# Patient Record
Sex: Female | Born: 1990 | Race: White | Hispanic: No | Smoking: Former smoker
Health system: Southern US, Community
[De-identification: ages and names within clinical notes are randomized; demographics above are authoritative.]

## PROBLEM LIST (undated history)

## (undated) DIAGNOSIS — R519 Headache, unspecified: Secondary | ICD-10-CM

## (undated) DIAGNOSIS — F419 Anxiety disorder, unspecified: Secondary | ICD-10-CM

## (undated) DIAGNOSIS — F32A Depression, unspecified: Secondary | ICD-10-CM

## (undated) DIAGNOSIS — F329 Major depressive disorder, single episode, unspecified: Secondary | ICD-10-CM

## (undated) DIAGNOSIS — G459 Transient cerebral ischemic attack, unspecified: Secondary | ICD-10-CM

## (undated) DIAGNOSIS — F209 Schizophrenia, unspecified: Secondary | ICD-10-CM

## (undated) DIAGNOSIS — G43909 Migraine, unspecified, not intractable, without status migrainosus: Secondary | ICD-10-CM

## (undated) DIAGNOSIS — F314 Bipolar disorder, current episode depressed, severe, without psychotic features: Secondary | ICD-10-CM

## (undated) DIAGNOSIS — K5900 Constipation, unspecified: Secondary | ICD-10-CM

## (undated) DIAGNOSIS — R51 Headache: Secondary | ICD-10-CM

## (undated) DIAGNOSIS — I639 Cerebral infarction, unspecified: Secondary | ICD-10-CM

## (undated) HISTORY — DX: Anxiety disorder, unspecified: F41.9

## (undated) HISTORY — DX: Bipolar disorder, current episode depressed, severe, without psychotic features: F31.4

## (undated) HISTORY — DX: Transient cerebral ischemic attack, unspecified: G45.9

## (undated) HISTORY — DX: Cerebral infarction, unspecified: I63.9

## (undated) HISTORY — DX: Constipation, unspecified: K59.00

## (undated) HISTORY — DX: Migraine, unspecified, not intractable, without status migrainosus: G43.909

## (undated) HISTORY — PX: WISDOM TOOTH EXTRACTION: SHX21

---
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
## 2008-09-28 ENCOUNTER — Emergency Department: Payer: Self-pay | Admitting: Emergency Medicine

## 2009-11-22 ENCOUNTER — Inpatient Hospital Stay: Payer: Self-pay | Admitting: Psychiatry

## 2011-05-02 ENCOUNTER — Ambulatory Visit: Payer: Self-pay | Admitting: Family Medicine

## 2011-05-09 ENCOUNTER — Ambulatory Visit: Payer: Self-pay | Admitting: Family Medicine

## 2012-04-23 ENCOUNTER — Ambulatory Visit: Payer: Self-pay | Admitting: Family Medicine

## 2012-08-30 ENCOUNTER — Emergency Department: Payer: Self-pay | Admitting: Emergency Medicine

## 2012-08-30 LAB — URINALYSIS, COMPLETE
Bacteria: NONE SEEN
Glucose,UR: NEGATIVE mg/dL (ref 0–75)
Ketone: NEGATIVE
Leukocyte Esterase: NEGATIVE
Ph: 6 (ref 4.5–8.0)
Protein: NEGATIVE
RBC,UR: 4 /HPF (ref 0–5)
Specific Gravity: 1.025 (ref 1.003–1.030)
WBC UR: 14 /HPF (ref 0–5)

## 2012-08-30 LAB — COMPREHENSIVE METABOLIC PANEL
Albumin: 3.1 g/dL — ABNORMAL LOW (ref 3.4–5.0)
Alkaline Phosphatase: 88 U/L (ref 50–136)
Bilirubin,Total: 0.6 mg/dL (ref 0.2–1.0)
Calcium, Total: 8.9 mg/dL (ref 8.5–10.1)
Chloride: 107 mmol/L (ref 98–107)
Creatinine: 0.72 mg/dL (ref 0.60–1.30)
EGFR (African American): >60
Glucose: 100 mg/dL — ABNORMAL HIGH (ref 65–99)
Osmolality: 280 (ref 275–301)
SGPT (ALT): 39 U/L (ref 12–78)
Sodium: 141 mmol/L (ref 136–145)

## 2012-08-30 LAB — CBC
HCT: 43.8 % (ref 35.0–47.0)
MCV: 90 fL (ref 80–100)
Platelet: 198 10*3/uL (ref 150–440)
RBC: 4.88 10*6/uL (ref 3.80–5.20)
RDW: 12.4 % (ref 11.5–14.5)
WBC: 7.7 10*3/uL (ref 3.6–11.0)

## 2012-08-30 LAB — PREGNANCY, URINE: Pregnancy Test, Urine: NEGATIVE m[IU]/mL

## 2012-10-13 ENCOUNTER — Emergency Department: Payer: Self-pay | Admitting: Emergency Medicine

## 2012-12-08 ENCOUNTER — Encounter: Payer: Self-pay | Admitting: Obstetrics and Gynecology
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
## 2013-07-03 ENCOUNTER — Ambulatory Visit: Payer: Self-pay | Admitting: Obstetrics and Gynecology

## 2013-07-03 LAB — CBC WITH DIFFERENTIAL/PLATELET
Basophil #: 0 10*3/uL (ref 0.0–0.1)
Basophil %: 0.2 %
HCT: 37.6 % (ref 35.0–47.0)
HGB: 13.3 g/dL (ref 12.0–16.0)
Lymphocyte %: 14.1 %
MCH: 31.2 pg (ref 26.0–34.0)
MCHC: 35.4 g/dL (ref 32.0–36.0)
MCV: 88 fL (ref 80–100)
Monocyte #: 0.8 x10 3/mm (ref 0.2–0.9)
Monocyte %: 6 %
Neutrophil #: 10.4 10*3/uL — ABNORMAL HIGH (ref 1.4–6.5)
RBC: 4.27 10*6/uL (ref 3.80–5.20)

## 2013-07-06 ENCOUNTER — Inpatient Hospital Stay: Payer: Self-pay | Admitting: Obstetrics and Gynecology

## 2013-07-06 LAB — GC/CHLAMYDIA PROBE AMP

## 2013-07-07 LAB — HEMATOCRIT: HCT: 35.1 % (ref 35.0–47.0)

## 2014-04-12 ENCOUNTER — Ambulatory Visit: Payer: Self-pay | Admitting: Family Medicine

## 2015-03-18 NOTE — Op Note (Signed)
PATIENT NAME:  Terri Holt, Terri Holt MR#:  [id number] DATE OF BIRTH:  12-19-1990  DATE OF PROCEDURE:  07/06/2013  PREOPERATIVE DIAGNOSES: 1.  Term intrauterine pregnancy, undelivered.  2.  History of herpes simplex virus-2.  3.  Desires primary cesarean section delivery.   POSTOPERATIVE DIAGNOSES:  1.  Term intrauterine pregnancy, undelivered.  2.  History of herpes simplex virus-2.  3.  Desires primary cesarean section delivery.   OPERATIVE PROCEDURE: Primary low cervical transverse cesarean section.   SURGEON: Herold HarmsMartin A Yetzali Weld, M.D.   FIRST ASSISTANT: Earlie LouLindsay Overton, FNP.   ANESTHESIA: Spinal.   INDICATIONS: The patient is a [age] year old white female G2, para 0-0-1-0, at 5139 weeks gestation who presents for primary low cervical transverse cesarean section. The patient has history of HSV-2 and desires primary cesarean section delivery. She does not have any active outbreak or prodromal symptoms. The patient accepts risks of surgical procedure.   FINDINGS AT SURGERY: Viable female infant, 7 pounds, 15, ounces having Apgars of 9 and 9 at one and five minutes, respectively. There was a nuchal cord x 1 noted. The uterus, tubes, and ovaries were grossly normal.   DESCRIPTION OF PROCEDURE: The patient was brought to the operating room where she was placed in the sitting position. Spinal anesthetic was introduced without difficulty. She was placed in the supine position with a right lateral hip roll in place. A ChloraPrep abdominal and perineal prep and drape was performed in standard fashion. A Foley catheter was placed and was draining clear yellow urine. After checking for adequate level of anesthesia, a Pfannenstiel incision was made in the abdomen. The fascia was incised transversely and extended bilaterally with Mayo scissors. The midline raphe was incised, separated, and peritoneum entered. Bladder flap was created over lower uterine segment through sharp dissection. A low transverse incision  was made in the uterus and this was extended bluntly cephalad and caudad. The amniotic fluid was noted to be clear. The infant was delivered through a vertex presentation. Nuchal cord x 1 was reduced. The infant was vigorous at birth. The umbilical cord was doubly clamped and cut and the infant was handed off to the awaiting resuscitation team. Cord blood sampling was obtained. The uterus was expressed from the operative field and the uterus was externalized onto the abdomen. The uterus was cleared of all debris with laps. The incision was closed in two layers using #1 chromic suture. First layer was a running locking stitch, second layer was an imbricating layer. Good hemostasis was noted. The tubes and ovaries were normal. Uterus was placed back into the abdominal pelvic cavity. Gutters were cleared of all debris with laps. The incision was closed in layers with 0 Maxon being used in the fascia in a simple running manner. A 2-0 Vicryl was used to reapproximate the subcutaneous tissues. A 4-0 Vicryl subcuticular stitch was used to close the skin. Steri-Strips were applied along with Telfa pad and Tegaderm dressing. The patient was then mobilized, and taken to the recovery room in satisfactory condition.   ESTIMATED BLOOD LOSS: 500 mL.   Urine output was not quantified at the time of this dictation. All instruments, needle and sponge counts were verified as correct.    ____________________________ Prentice DockerMartin A. Laysa Kimmey, MD mad:cc D: 07/06/2013 21:43:41 ET T: 07/06/2013 21:53:31 ET JOB#: [id number]  cc: Daphine DeutscherMartin A. Neithan Day, MD, <Dictator> [facility] Prentice DockerMARTIN A Jaysin Gayler MD ELECTRONICALLY SIGNED 07/13/2013 8:31

## 2015-08-03 ENCOUNTER — Encounter: Payer: Self-pay | Admitting: Family Medicine

## 2015-08-03 ENCOUNTER — Ambulatory Visit (INDEPENDENT_AMBULATORY_CARE_PROVIDER_SITE_OTHER): Payer: 59 | Admitting: Family Medicine

## 2015-08-03 VITALS — BP 122/76 | HR 76 | Temp 97.6°F | Resp 16 | Wt 150.2 lb

## 2015-08-03 DIAGNOSIS — IMO0001 Reserved for inherently not codable concepts without codable children: Secondary | ICD-10-CM | POA: Insufficient documentation

## 2015-08-03 DIAGNOSIS — F329 Major depressive disorder, single episode, unspecified: Secondary | ICD-10-CM | POA: Insufficient documentation

## 2015-08-03 DIAGNOSIS — F419 Anxiety disorder, unspecified: Secondary | ICD-10-CM | POA: Insufficient documentation

## 2015-08-03 DIAGNOSIS — F1911 Other psychoactive substance abuse, in remission: Secondary | ICD-10-CM | POA: Insufficient documentation

## 2015-08-03 DIAGNOSIS — Z9189 Other specified personal risk factors, not elsewhere classified: Secondary | ICD-10-CM | POA: Insufficient documentation

## 2015-08-03 DIAGNOSIS — B349 Viral infection, unspecified: Secondary | ICD-10-CM | POA: Diagnosis not present

## 2015-08-03 DIAGNOSIS — F1011 Alcohol abuse, in remission: Secondary | ICD-10-CM | POA: Insufficient documentation

## 2015-08-03 DIAGNOSIS — Z87898 Personal history of other specified conditions: Secondary | ICD-10-CM | POA: Insufficient documentation

## 2015-08-03 DIAGNOSIS — Z8719 Personal history of other diseases of the digestive system: Secondary | ICD-10-CM | POA: Insufficient documentation

## 2015-08-03 DIAGNOSIS — R259 Unspecified abnormal involuntary movements: Secondary | ICD-10-CM | POA: Insufficient documentation

## 2015-08-03 DIAGNOSIS — F32A Depression, unspecified: Secondary | ICD-10-CM | POA: Insufficient documentation

## 2015-08-03 DIAGNOSIS — B2790 Infectious mononucleosis, unspecified without complication: Secondary | ICD-10-CM | POA: Insufficient documentation

## 2015-08-03 DIAGNOSIS — Z8669 Personal history of other diseases of the nervous system and sense organs: Secondary | ICD-10-CM | POA: Insufficient documentation

## 2015-08-03 NOTE — Progress Notes (Signed)
Subjective:     Patient ID: Terri Holt, female   DOB: 05/20/1991, 24 y.o.   MRN: [id number]  HPI  Chief Complaint  Patient presents with  . Headache    Patient comes in office today with concerns of congestion, ear pain, hoarsness and bidy chills. On Saturday 9//3 patient reports ripping up tile in her bathroom and found black mold underneath, patient states Sunday morning she felt very ill with symptoms of nausea and headache.   Reports headaches, sinus congestion, hoarseness, joint aches and dry cough. Has taken Excedrin Migraine and Alka-Seltzer for her sx. Her son, Leonor Liv, is not ill and accompanied her today.   Review of Systems  Constitutional: Negative for fever and chills.       Objective:   Physical Exam  Constitutional: She appears well-developed and well-nourished. No distress.  Ears: T.M's intact without inflammation Throat: Mild erythema but no tonsillar enlargement or exudate Neck: no cervical adenopathy Lungs: clear     Assessment:    1. Viral syndrome    Plan:    Discussed use of Mucinex D for congestion, Delsym for cough, and Benadryl for postnasal drainage

## 2015-08-03 NOTE — Patient Instructions (Signed)
Discussed use of Mucinex D for congestion, Delsym for cough, and Benadryl for postnasal drainage 

## 2015-09-05 ENCOUNTER — Other Ambulatory Visit: Payer: Self-pay | Admitting: Family Medicine

## 2015-09-05 ENCOUNTER — Telehealth: Payer: Self-pay | Admitting: Family Medicine

## 2015-09-05 DIAGNOSIS — Z920 Personal history of contraception: Secondary | ICD-10-CM

## 2015-09-05 MED ORDER — NORGESTIMATE-ETH ESTRADIOL 0.25-35 MG-MCG PO TABS: 1.0000 | ORAL_TABLET | Freq: Every day | ORAL | Status: DC

## 2015-09-05 NOTE — Telephone Encounter (Signed)
Pt would like a referral to a new OB/GYN office.  Please contact the patient at [phone].

## 2015-09-05 NOTE — Telephone Encounter (Signed)
Wishes referral on birth control. Reports normal Pap smear this year. Refills sent in.

## 2015-09-05 NOTE — Telephone Encounter (Signed)
Referral request

## 2016-11-16 ENCOUNTER — Encounter: Payer: Self-pay | Admitting: Family Medicine

## 2016-11-16 ENCOUNTER — Ambulatory Visit (INDEPENDENT_AMBULATORY_CARE_PROVIDER_SITE_OTHER): Payer: BLUE CROSS/BLUE SHIELD | Admitting: Family Medicine

## 2016-11-16 VITALS — BP 108/64 | HR 84 | Temp 97.9°F | Resp 16 | Wt 151.0 lb

## 2016-11-16 DIAGNOSIS — F419 Anxiety disorder, unspecified: Secondary | ICD-10-CM

## 2016-11-16 DIAGNOSIS — R41840 Attention and concentration deficit: Secondary | ICD-10-CM

## 2016-11-16 NOTE — Progress Notes (Signed)
Subjective:     Patient ID: Terri Holt, female   DOB: 1991/04/11, 25 y.o.   MRN: [id number]  HPI  Chief Complaint  Patient presents with  . ADHD    Pt was on Adderall as a child. She is now taking real estate classes and feels like she can not concentrate.   States she took a friend's  Vyvanse 20 mg and felt she could concentrate better. Reports childhood hx of ADHD. Continues to have bouts of anxiety/irritability but denies depression. States she withdraws from the situation until she calms down. Feels her 27 year old son is a trigger at times. Reports prior dx of bipolar disorder but not under treatment at this time.   Review of Systems     Objective:   Physical Exam  Constitutional: She appears well-developed and well-nourished. No distress.  Psychiatric: She has a normal mood and affect. Her behavior is normal.       Assessment:    1. Anxiety - Ambulatory referral to Psychiatry  2. Poor concentration: will perform testing. - Ambulatory referral to Psychology    Plan:    Further f/u as needed.

## 2016-11-16 NOTE — Patient Instructions (Signed)
We will call you about the appointment times.

## 2017-07-04 ENCOUNTER — Ambulatory Visit (INDEPENDENT_AMBULATORY_CARE_PROVIDER_SITE_OTHER): Payer: Medicaid Other | Admitting: Certified Nurse Midwife

## 2017-07-04 ENCOUNTER — Encounter: Payer: Self-pay | Admitting: Certified Nurse Midwife

## 2017-07-04 ENCOUNTER — Encounter: Payer: BLUE CROSS/BLUE SHIELD | Admitting: Certified Nurse Midwife

## 2017-07-04 VITALS — BP 116/80 | HR 87 | Ht 67.0 in | Wt 153.0 lb

## 2017-07-04 DIAGNOSIS — N926 Irregular menstruation, unspecified: Secondary | ICD-10-CM

## 2017-07-04 DIAGNOSIS — Z3201 Encounter for pregnancy test, result positive: Secondary | ICD-10-CM | POA: Diagnosis not present

## 2017-07-04 DIAGNOSIS — O209 Hemorrhage in early pregnancy, unspecified: Secondary | ICD-10-CM | POA: Diagnosis not present

## 2017-07-04 LAB — POCT URINE PREGNANCY: Preg Test, Ur: POSITIVE — AB

## 2017-07-04 NOTE — Patient Instructions (Signed)
Vaginal Bleeding During Pregnancy, First Trimester A small amount of bleeding (spotting) from the vagina is common in early pregnancy. Sometimes the bleeding is normal and is not a problem, and sometimes it is a sign of something serious. Be sure to tell your doctor about any bleeding from your vagina right away. Follow these instructions at home:  Watch your condition for any changes.  Follow your doctor's instructions about how active you can be.  If you are on bed rest: ? You may need to stay in bed and only get up to use the bathroom. ? You may be allowed to do some activities. ? If you need help, make plans for someone to help you.  Write down: ? The number of pads you use each day. ? How often you change pads. ? How soaked (saturated) your pads are.  Do not use tampons.  Do not douche.  Do not have sex or orgasms until your doctor says it is okay.  If you pass any tissue from your vagina, save the tissue so you can show it to your doctor.  Only take medicines as told by your doctor.  Do not take aspirin because it can make you bleed.  Keep all follow-up visits as told by your doctor. Contact a doctor if:  You bleed from your vagina.  You have cramps.  You have labor pains.  You have a fever that does not go away after you take medicine. Get help right away if:  You have very bad cramps in your back or belly (abdomen).  You pass large clots or tissue from your vagina.  You bleed more.  You feel light-headed or weak.  You pass out (faint).  You have chills.  You are leaking fluid or have a gush of fluid from your vagina.  You pass out while pooping (having a bowel movement). This information is not intended to replace advice given to you by your health care provider. Make sure you discuss any questions you have with your health care provider. Document Released: 03/29/2014 Document Revised: 04/19/2016 Document Reviewed: 07/20/2013 Elsevier Interactive  Patient Education  2018 [employer]. Morning Sickness Morning sickness is when you feel sick to your stomach (nauseous) during pregnancy. You may feel sick to your stomach and throw up (vomit). You may feel sick in the morning, but you can feel this way any time of day. Some women feel very sick to their stomach and cannot stop throwing up (hyperemesis gravidarum). Follow these instructions at home:  Only take medicines as told by your doctor.  Take multivitamins as told by your doctor. Taking multivitamins before getting pregnant can stop or lessen the harshness of morning sickness.  Eat dry toast or unsalted crackers before getting out of bed.  Eat 5 to 6 small meals a day.  Eat dry and bland foods like rice and baked potatoes.  Do not drink liquids with meals. Drink between meals.  Do not eat greasy, fatty, or spicy foods.  Have someone cook for you if the smell of food causes you to feel sick or throw up.  If you feel sick to your stomach after taking prenatal vitamins, take them at night or with a snack.  Eat protein when you need a snack (nuts, yogurt, cheese).  Eat unsweetened gelatins for dessert.  Wear a bracelet used for sea sickness (acupressure wristband).  Go to a doctor that puts thin needles into certain body points (acupuncture) to improve how you feel.  Do  not smoke.  Use a humidifier to keep the air in your house free of odors.  Get lots of fresh air. Contact a doctor if:  You need medicine to feel better.  You feel dizzy or lightheaded.  You are losing weight. Get help right away if:  You feel very sick to your stomach and cannot stop throwing up.  You pass out (faint). This information is not intended to replace advice given to you by your health care provider. Make sure you discuss any questions you have with your health care provider. Document Released: 12/20/2004 Document Revised: 04/19/2016 Document Reviewed: 04/29/2013 Elsevier Interactive  Patient Education  2017 [employer]. Common Medications Safe in Pregnancy  Acne:      Constipation:  Benzoyl Peroxide     Colace  Clindamycin      Dulcolax Suppository  Topica Erythromycin     Fibercon  Salicylic Acid      Metamucil         Miralax AVOID:        Senakot   Accutane    Cough:  Retin-A       Cough Drops  Tetracycline      Phenergan w/ Codeine if Rx  Minocycline      Robitussin (Plain & DM)  Antibiotics:     Crabs/Lice:  Ceclor       RID  Cephalosporins    AVOID:  E-Mycins      Kwell  Keflex  Macrobid/Macrodantin   Diarrhea:  Penicillin      Kao-Pectate  Zithromax      Imodium AD         PUSH FLUIDS AVOID:       Cipro     Fever:  Tetracycline      Tylenol (Regular or Extra  Minocycline       Strength)  Levaquin      Extra Strength-Do not          Exceed 8 tabs/24 hrs Caffeine:        <22m/day (equiv. To 1 cup of coffee or  approx. 3 12 oz sodas)         Gas: Cold/Hayfever:       Gas-X  Benadryl      Mylicon  Claritin       Phazyme  **Claritin-D        Chlor-Trimeton    Headaches:  Dimetapp      ASA-Free Excedrin  Drixoral-Non-Drowsy     Cold Compress  Mucinex (Guaifenasin)     Tylenol (Regular or Extra  Sudafed/Sudafed-12 Hour     Strength)  **Sudafed PE Pseudoephedrine   Tylenol Cold & Sinus     Vicks Vapor Rub  Zyrtec  **AVOID if Problems With Blood Pressure         Heartburn: Avoid lying down for at least 1 hour after meals  Aciphex      Maalox     Rash:  Milk of Magnesia     Benadryl    Mylanta       1% Hydrocortisone Cream  Pepcid  Pepcid Complete   Sleep Aids:  Prevacid      Ambien   Prilosec       Benadryl  Rolaids       Chamomile Tea  Tums (Limit 4/day)     Unisom  Zantac       Tylenol PM         Warm milk-add vanilla or  Hemorrhoids:  Sugar for taste  Anusol/Anusol H.C.  (RX: Analapram 2.5%)  Sugar Substitutes:  Hydrocortisone OTC     Ok in moderation  Preparation H      Tucks        Vaseline lotion applied to  tissue with wiping    Herpes:     Throat:  Acyclovir      Oragel  Famvir  Valtrex     Vaccines:         Flu Shot Leg Cramps:       *Gardasil  Benadryl      Hepatitis A         Hepatitis B Nasal Spray:       Pneumovax  Saline Nasal Spray     Polio Booster         Tetanus Nausea:       Tuberculosis test or PPD  Vitamin B6 25 mg TID   AVOID:    Dramamine      *Gardasil  Emetrol       Live Poliovirus  Ginger Root 250 mg QID    MMR (measles, mumps &  High Complex Carbs @ Bedtime    rebella)  Sea Bands-Accupressure    Varicella (Chickenpox)  Unisom 1/2 tab TID     *No known complications           If received before Pain:         Known pregnancy;   Darvocet       Resume series after  Lortab        Delivery  Percocet    Yeast:   Tramadol      Femstat  Tylenol 3      Gyne-lotrimin  Ultram       Monistat  Vicodin           MISC:         All Sunscreens           Hair Coloring/highlights          Insect Repellant's          (Including DEET)         Mystic Tans First Trimester of Pregnancy The first trimester of pregnancy is from week 1 until the end of week 13 (months 1 through 3). A week after a sperm fertilizes an egg, the egg will implant on the wall of the uterus. This embryo will begin to develop into a baby. Genes from you and your partner will form the baby. The female genes will determine whether the baby will be a boy or a girl. At 6-8 weeks, the eyes and face will be formed, and the heartbeat can be seen on ultrasound. At the end of 12 weeks, all the baby's organs will be formed. Now that you are pregnant, you will want to do everything you can to have a healthy baby. Two of the most important things are to get good prenatal care and to follow your health care provider's instructions. Prenatal care is all the medical care you receive before the baby's birth. This care will help prevent, find, and treat any problems during the pregnancy and childbirth. Body changes during your  first trimester Your body goes through many changes during pregnancy. The changes vary from woman to woman.  You may gain or lose a couple of pounds at first.  You may feel sick to your stomach (nauseous) and you may throw up (vomit). If the vomiting is uncontrollable, call your health care provider.  You may tire easily.  You may develop headaches that can be relieved by medicines. All medicines should be approved by your health care provider.  You may urinate more often. Painful urination may mean you have a bladder infection.  You may develop heartburn as a result of your pregnancy.  You may develop constipation because certain hormones are causing the muscles that push stool through your intestines to slow down.  You may develop hemorrhoids or swollen veins (varicose veins).  Your breasts may begin to grow larger and become tender. Your nipples may stick out more, and the tissue that surrounds them (areola) may become darker.  Your gums may bleed and may be sensitive to brushing and flossing.  Dark spots or blotches (chloasma, mask of pregnancy) may develop on your face. This will likely fade after the baby is born.  Your menstrual periods will stop.  You may have a loss of appetite.  You may develop cravings for certain kinds of food.  You may have changes in your emotions from day to day, such as being excited to be pregnant or being concerned that something may go wrong with the pregnancy and baby.  You may have more vivid and strange dreams.  You may have changes in your hair. These can include thickening of your hair, rapid growth, and changes in texture. Some women also have hair loss during or after pregnancy, or hair that feels dry or thin. Your hair will most likely return to normal after your baby is born.  What to expect at prenatal visits During a routine prenatal visit:  You will be weighed to make sure you and the baby are growing normally.  Your blood  pressure will be taken.  Your abdomen will be measured to track your baby's growth.  The fetal heartbeat will be listened to between weeks 10 and 14 of your pregnancy.  Test results from any previous visits will be discussed.  Your health care provider may ask you:  How you are feeling.  If you are feeling the baby move.  If you have had any abnormal symptoms, such as leaking fluid, bleeding, severe headaches, or abdominal cramping.  If you are using any tobacco products, including cigarettes, chewing tobacco, and electronic cigarettes.  If you have any questions.  Other tests that may be performed during your first trimester include:  Blood tests to find your blood type and to check for the presence of any previous infections. The tests will also be used to check for low iron levels (anemia) and protein on red blood cells (Rh antibodies). Depending on your risk factors, or if you previously had diabetes during pregnancy, you may have tests to check for high blood sugar that affects pregnant women (gestational diabetes).  Urine tests to check for infections, diabetes, or protein in the urine.  An ultrasound to confirm the proper growth and development of the baby.  Fetal screens for spinal cord problems (spina bifida) and Down syndrome.  HIV (human immunodeficiency virus) testing. Routine prenatal testing includes screening for HIV, unless you choose not to have this test.  You may need other tests to make sure you and the baby are doing well.  Follow these instructions at home: Medicines  Follow your health care provider's instructions regarding medicine use. Specific medicines may be either safe or unsafe to take during pregnancy.  Take a prenatal vitamin that contains at least 600 micrograms (mcg) of folic acid.  If you develop constipation, try taking a  stool softener if your health care provider approves. Eating and drinking  Eat a balanced diet that includes fresh  fruits and vegetables, whole grains, good sources of protein such as meat, eggs, or tofu, and low-fat dairy. Your health care provider will help you determine the amount of weight gain that is right for you.  Avoid raw meat and uncooked cheese. These carry germs that can cause birth defects in the baby.  Eating four or five small meals rather than three large meals a day may help relieve nausea and vomiting. If you start to feel nauseous, eating a few soda crackers can be helpful. Drinking liquids between meals, instead of during meals, also seems to help ease nausea and vomiting.  Limit foods that are high in fat and processed sugars, such as fried and sweet foods.  To prevent constipation: ? Eat foods that are high in fiber, such as fresh fruits and vegetables, whole grains, and beans. ? Drink enough fluid to keep your urine clear or pale yellow. Activity  Exercise only as directed by your health care provider. Most women can continue their usual exercise routine during pregnancy. Try to exercise for 30 minutes at least 5 days a week. Exercising will help you: ? Control your weight. ? Stay in shape. ? Be prepared for labor and delivery.  Experiencing pain or cramping in the lower abdomen or lower back is a good sign that you should stop exercising. Check with your health care provider before continuing with normal exercises.  Try to avoid standing for long periods of time. Move your legs often if you must stand in one place for a long time.  Avoid heavy lifting.  Wear low-heeled shoes and practice good posture.  You may continue to have sex unless your health care provider tells you not to. Relieving pain and discomfort  Wear a good support bra to relieve breast tenderness.  Take warm sitz baths to soothe any pain or discomfort caused by hemorrhoids. Use hemorrhoid cream if your health care provider approves.  Rest with your legs elevated if you have leg cramps or low back  pain.  If you develop varicose veins in your legs, wear support hose. Elevate your feet for 15 minutes, 3-4 times a day. Limit salt in your diet. Prenatal care  Schedule your prenatal visits by the twelfth week of pregnancy. They are usually scheduled monthly at first, then more often in the last 2 months before delivery.  Write down your questions. Take them to your prenatal visits.  Keep all your prenatal visits as told by your health care provider. This is important. Safety  Wear your seat belt at all times when driving.  Make a list of emergency phone numbers, including numbers for family, friends, the hospital, and police and fire departments. General instructions  Ask your health care provider for a referral to a local prenatal education class. Begin classes no later than the beginning of month 6 of your pregnancy.  Ask for help if you have counseling or nutritional needs during pregnancy. Your health care provider can offer advice or refer you to specialists for help with various needs.  Do not use hot tubs, steam rooms, or saunas.  Do not douche or use tampons or scented sanitary pads.  Do not cross your legs for long periods of time.  Avoid cat litter boxes and soil used by cats. These carry germs that can cause birth defects in the baby and possibly loss of the fetus by  miscarriage or stillbirth.  Avoid all smoking, herbs, alcohol, and medicines not prescribed by your health care provider. Chemicals in these products affect the formation and growth of the baby.  Do not use any products that contain nicotine or tobacco, such as cigarettes and e-cigarettes. If you need help quitting, ask your health care provider. You may receive counseling support and other resources to help you quit.  Schedule a dentist appointment. At home, brush your teeth with a soft toothbrush and be gentle when you floss. Contact a health care provider if:  You have dizziness.  You have mild  pelvic cramps, pelvic pressure, or nagging pain in the abdominal area.  You have persistent nausea, vomiting, or diarrhea.  You have a bad smelling vaginal discharge.  You have pain when you urinate.  You notice increased swelling in your face, hands, legs, or ankles.  You are exposed to fifth disease or chickenpox.  You are exposed to [place] measles (rubella) and have never had it. Get help right away if:  You have a fever.  You are leaking fluid from your vagina.  You have spotting or bleeding from your vagina.  You have severe abdominal cramping or pain.  You have rapid weight gain or loss.  You vomit blood or material that looks like coffee grounds.  You develop a severe headache.  You have shortness of breath.  You have any kind of trauma, such as from a fall or a car accident. Summary  The first trimester of pregnancy is from week 1 until the end of week 13 (months 1 through 3).  Your body goes through many changes during pregnancy. The changes vary from woman to woman.  You will have routine prenatal visits. During those visits, your health care provider will examine you, discuss any test results you may have, and talk with you about how you are feeling. This information is not intended to replace advice given to you by your health care provider. Make sure you discuss any questions you have with your health care provider. Document Released: 11/06/2001 Document Revised: 10/24/2016 Document Reviewed: 10/24/2016 Elsevier Interactive Patient Education  2017 [employer].

## 2017-07-04 NOTE — Progress Notes (Signed)
GYN ENCOUNTER NOTE  Subjective:       Terri Holt is a 26 y.o. G45P1001 female here for pregnancy confirmation.   Endorses breast tenderness, food cravings, and scent aversions.    Reports vaginal spotting on 05/29/2017 that lasted for one (1) day.   This is a planned pregnancy and she is taking a prenatal vitamin with folic acid and DHA.   Denies difficulty breathing or respiratory distress, chest pain, abdominal pain, dysuria, and leg pain or swelling.    Gynecologic History  Patient's last menstrual period was 04/26/2017 (approximate).   Estimated date of birth: 01/31/2019  Gestational age: 48 weeks 6 days  Obstetric History  OB History  Gravida Para Term Preterm AB Living  2 1 1     1   SAB TAB Ectopic Multiple Live Births          1    # Outcome Date GA Lbr Len/2nd Weight Sex Delivery Anes PTL Lv  2 Current           1 Term 07/06/13 [redacted]w[redacted]d  7 lb 8 oz (3.402 kg) M CS-Unspec  N LIV      Past Medical History:  Diagnosis Date  . Anxiety   . GERD (gastroesophageal reflux disease)   . Migraines   . Mini stroke [facility])     Past Surgical History:  Procedure Laterality Date  . CESAREAN SECTION      No current outpatient prescriptions on file prior to visit.   No current facility-administered medications on file prior to visit.     Allergies  Allergen Reactions  . Amoxicillin Hives  . Penicillins Hives    Social History   Social History  . Marital status: Single    Spouse name: N/A  . Number of children: N/A  . Years of education: N/A   Occupational History  . Not on file.   Social History Main Topics  . Smoking status: Former Games developer  . Smokeless tobacco: Never Used  . Alcohol use 0.0 oz/week     Comment: wine at night, occasionally  . Drug use: No  . Sexual activity: Yes    Partners: Male    Birth control/ protection: None   Other Topics Concern  . Not on file   Social History Narrative  . No narrative on file    Family History   Problem Relation Age of Onset  . Migraines Mother   . Asthma Brother   . Hyperlipidemia Brother   . Asthma Maternal Grandmother   . Hyperlipidemia Maternal Grandmother   . Heart failure Maternal Grandfather   . Hyperlipidemia Maternal Grandfather   . Heart failure Paternal Grandmother   . Cancer Paternal Grandmother        tumor  . Asthma Son   . Thyroid disease Maternal Aunt   . Cancer Maternal Aunt        melanoma    The following portions of the patient's history were reviewed and updated as appropriate: allergies, current medications, past family history, past medical history, past social history, past surgical history and problem list.  Review of Systems  Review of Systems - Negative except as noted above.  History obtained from the patient.   Objective:   BP 116/80   Pulse 87   Ht 5\' 7"  (1.702 m)   Wt 153 lb (69.4 kg)   LMP 04/26/2017 (Approximate) Comment: spotted, hx irreg. menses  BMI 23.96 kg/m   Alert and oriented x 4, no apparent distress.  Positive UPT  Physical exam: not indicated.   Assessment:   1. Missed menses  - POCT urine pregnancy - Beta HCG, Quant  2. Vaginal bleeding in pregnancy, first trimester  - Beta HCG, Quant  3. Positive pregnancy test  Plan:   Labs: Beta hCG today.   Reviewed red flag symptoms and when to call.   RTC x 2 weeks for dating/viability US and nurse intake.    Gunnar BullaJenkins Michelle Ayahna Solazzo, CNM   A total of 20 minutes were spent face-to-face with the patient during the encounter with greater than 50% dealing with counseling and coordination of care.

## 2017-07-05 LAB — BETA HCG QUANT (REF LAB): hCG Quant: 5136 m[IU]/mL

## 2017-07-08 ENCOUNTER — Telehealth: Payer: Self-pay | Admitting: Certified Nurse Midwife

## 2017-07-08 ENCOUNTER — Encounter: Payer: Self-pay | Admitting: Certified Nurse Midwife

## 2017-07-08 NOTE — Telephone Encounter (Signed)
Patient called requesting beta results.Thanks °

## 2017-07-08 NOTE — Telephone Encounter (Signed)
Please contact patient and inform Beta hCG 5,136, equivalent with approximately five (5) to seven (7) weeks of pregnancy. Levels are not universal for all women, just a guideline. Should keep appointment scheduled for next week. Contact office with any abdominal pain, vaginal bleeding or excessive nausea and vomiting. Thanks, JML

## 2017-07-09 ENCOUNTER — Other Ambulatory Visit: Payer: Self-pay | Admitting: Certified Nurse Midwife

## 2017-07-09 DIAGNOSIS — Z369 Encounter for antenatal screening, unspecified: Secondary | ICD-10-CM

## 2017-07-18 ENCOUNTER — Other Ambulatory Visit: Payer: Self-pay | Admitting: Certified Nurse Midwife

## 2017-07-18 ENCOUNTER — Ambulatory Visit (INDEPENDENT_AMBULATORY_CARE_PROVIDER_SITE_OTHER): Payer: BLUE CROSS/BLUE SHIELD | Admitting: Obstetrics and Gynecology

## 2017-07-18 ENCOUNTER — Ambulatory Visit (INDEPENDENT_AMBULATORY_CARE_PROVIDER_SITE_OTHER): Payer: Medicaid Other

## 2017-07-18 VITALS — BP 109/73 | HR 76 | Ht 67.0 in | Wt 157.0 lb

## 2017-07-18 DIAGNOSIS — Z3491 Encounter for supervision of normal pregnancy, unspecified, first trimester: Secondary | ICD-10-CM

## 2017-07-18 DIAGNOSIS — Z113 Encounter for screening for infections with a predominantly sexual mode of transmission: Secondary | ICD-10-CM

## 2017-07-18 DIAGNOSIS — Z369 Encounter for antenatal screening, unspecified: Secondary | ICD-10-CM | POA: Diagnosis not present

## 2017-07-18 DIAGNOSIS — Z3481 Encounter for supervision of other normal pregnancy, first trimester: Secondary | ICD-10-CM

## 2017-07-18 DIAGNOSIS — Z98891 History of uterine scar from previous surgery: Secondary | ICD-10-CM

## 2017-07-18 NOTE — Patient Instructions (Addendum)
How a Baby Grows During Pregnancy Pregnancy begins when a female's sperm enters a female's egg (fertilization). This happens in one of the tubes (fallopian tubes) that connect the ovaries to the womb (uterus). The fertilized egg is called an embryo until it reaches 10 weeks. From 10 weeks until birth, it is called a fetus. The fertilized egg moves down the fallopian tube to the uterus. Then it implants into the lining of the uterus and begins to grow. The developing fetus receives oxygen and nutrients through the pregnant woman's bloodstream and the tissues that grow (placenta) to support the fetus. The placenta is the life support system for the fetus. It provides nutrition and removes waste. Learning as much as you can about your pregnancy and how your baby is developing can help you enjoy the experience. It can also make you aware of when there might be a problem and when to ask questions. How long does a typical pregnancy last? A pregnancy usually lasts 280 days, or about 40 weeks. Pregnancy is divided into three trimesters:  First trimester: 0-13 weeks.  Second trimester: 14-27 weeks.  Third trimester: 28-40 weeks.  The day when your baby is considered ready to be born (full term) is your estimated date of delivery. How does my baby develop month by month? First month  The fertilized egg attaches to the inside of the uterus.  Some cells will form the placenta. Others will form the fetus.  The arms, legs, brain, spinal cord, lungs, and heart begin to develop.  At the end of the first month, the heart begins to beat.  Second month  The bones, inner ear, eyelids, hands, and feet form.  The genitals develop.  By the end of 8 weeks, all major organs are developing.  Third month  All of the internal organs are forming.  Teeth develop below the gums.  Bones and muscles begin to grow. The spine can flex.  The skin is transparent.  Fingernails and toenails begin to form.  Arms  and legs continue to grow longer, and hands and feet develop.  The fetus is about 3 in (7.6 cm) long.  Fourth month  The placenta is completely formed.  The external sex organs, neck, outer ear, eyebrows, eyelids, and fingernails are formed.  The fetus can hear, swallow, and move its arms and legs.  The kidneys begin to produce urine.  The skin is covered with a white waxy coating (vernix) and very fine hair (lanugo).  Fifth month  The fetus moves around more and can be felt for the first time (quickening).  The fetus starts to sleep and wake up and may begin to suck its finger.  The nails grow to the end of the fingers.  The organ in the digestive system that makes bile (gallbladder) functions and helps to digest the nutrients.  If your baby is a girl, eggs are present in her ovaries. If your baby is a boy, testicles start to move down into his scrotum.  Sixth month  The lungs are formed, but the fetus is not yet able to breathe.  The eyes open. The brain continues to develop.  Your baby has fingerprints and toe prints. Your baby's hair grows thicker.  At the end of the second trimester, the fetus is about 9 in (22.9 cm) long.  Seventh month  The fetus kicks and stretches.  The eyes are developed enough to sense changes in light.  The hands can make a grasping motion.  The   fetus responds to sound.  Eighth month  All organs and body systems are fully developed and functioning.  Bones harden and taste buds develop. The fetus may hiccup.  Certain areas of the brain are still developing. The skull remains soft.  Ninth month  The fetus gains about  lb (0.23 kg) each week.  The lungs are fully developed.  Patterns of sleep develop.  The fetus's head typically moves into a head-down position (vertex) in the uterus to prepare for birth. If the buttocks move into a vertex position instead, the baby is breech.  The fetus weighs 6-9 lbs (2.72-4.08 kg) and is  19-20 in (48.26-50.8 cm) long.  What can I do to have a healthy pregnancy and help my baby develop? Eating and Drinking  Eat a healthy diet. ? Talk with your health care provider to make sure that you are getting the nutrients that you and your baby need. ? Visit www.choosemyplate.gov to learn about creating a healthy diet.  Gain a healthy amount of weight during pregnancy as advised by your health care provider. This is usually 25-35 pounds. You may need to: ? Gain more if you were underweight before getting pregnant or if you are pregnant with more than one baby. ? Gain less if you were overweight or obese when you got pregnant.  Medicines and Vitamins  Take prenatal vitamins as directed by your health care provider. These include vitamins such as folic acid, iron, calcium, and vitamin D. They are important for healthy development.  Take medicines only as directed by your health care provider. Read labels and ask a pharmacist or your health care provider whether over-the-counter medicines, supplements, and prescription drugs are safe to take during pregnancy.  Activities  Be physically active as advised by your health care provider. Ask your health care provider to recommend activities that are safe for you to do, such as walking or swimming.  Do not participate in strenuous or extreme sports.  Lifestyle  Do not drink alcohol.  Do not use any tobacco products, including cigarettes, chewing tobacco, or electronic cigarettes. If you need help quitting, ask your health care provider.  Do not use illegal drugs.  Safety  Avoid exposure to mercury, lead, or other heavy metals. Ask your health care provider about common sources of these heavy metals.  Avoid listeria infection during pregnancy. Follow these precautions: ? Do not eat soft cheeses or deli meats. ? Do not eat hot dogs unless they have been warmed up to the point of steaming, such as in the microwave oven. ? Do not  drink unpasteurized milk.  Avoid toxoplasmosis infection during pregnancy. Follow these precautions: ? Do not change your cat's litter box, if you have a cat. Ask someone else to do this for you. ? Wear gardening gloves while working in the yard.  General Instructions  Keep all follow-up visits as directed by your health care provider. This is important. This includes prenatal care and screening tests.  Manage any chronic health conditions. Work closely with your health care provider to keep conditions, such as diabetes, under control.  How do I know if my baby is developing well? At each prenatal visit, your health care provider will do several different tests to check on your health and keep track of your baby's development. These include:  Fundal height. ? Your health care provider will measure your growing belly from top to bottom using a tape measure. ? Your health care provider will also feel your belly   to determine your baby's position.  Heartbeat. ? An ultrasound in the first trimester can confirm pregnancy and show a heartbeat, depending on how far along you are. ? Your health care provider will check your baby's heart rate at every prenatal visit. ? As you get closer to your delivery date, you may have regular fetal heart rate monitoring to make sure that your baby is not in distress.  Second trimester ultrasound. ? This ultrasound checks your baby's development. It also indicates your baby's gender.  What should I do if I have concerns about my baby's development? Always talk with your health care provider about any concerns that you may have. This information is not intended to replace advice given to you by your health care provider. Make sure you discuss any questions you have with your health care provider. Document Released: 04/30/2008 Document Revised: 04/19/2016 Document Reviewed: 04/21/2014 Elsevier Interactive Patient Education  2018 [place]  of Pregnancy The first trimester of pregnancy is from week 1 until the end of week 13 (months 1 through 3). A week after a sperm fertilizes an egg, the egg will implant on the wall of the uterus. This embryo will begin to develop into a baby. Genes from you and your partner will form the baby. The female genes will determine whether the baby will be a boy or a girl. At 6-8 weeks, the eyes and face will be formed, and the heartbeat can be seen on ultrasound. At the end of 12 weeks, all the baby's organs will be formed. Now that you are pregnant, you will want to do everything you can to have a healthy baby. Two of the most important things are to get good prenatal care and to follow your health care provider's instructions. Prenatal care is all the medical care you receive before the baby's birth. This care will help prevent, find, and treat any problems during the pregnancy and childbirth. Body changes during your first trimester Your body goes through many changes during pregnancy. The changes vary from woman to woman.  You may gain or lose a couple of pounds at first.  You may feel sick to your stomach (nauseous) and you may throw up (vomit). If the vomiting is uncontrollable, call your health care provider.  You may tire easily.  You may develop headaches that can be relieved by medicines. All medicines should be approved by your health care provider.  You may urinate more often. Painful urination may mean you have a bladder infection.  You may develop heartburn as a result of your pregnancy.  You may develop constipation because certain hormones are causing the muscles that push stool through your intestines to slow down.  You may develop hemorrhoids or swollen veins (varicose veins).  Your breasts may begin to grow larger and become tender. Your nipples may stick out more, and the tissue that surrounds them (areola) may become darker.  Your gums may bleed and may be sensitive to brushing  and flossing.  Dark spots or blotches (chloasma, mask of pregnancy) may develop on your face. This will likely fade after the baby is born.  Your menstrual periods will stop.  You may have a loss of appetite.  You may develop cravings for certain kinds of food.  You may have changes in your emotions from day to day, such as being excited to be pregnant or being concerned that something may go wrong with the pregnancy and baby.  You may have more vivid  and strange dreams.  You may have changes in your hair. These can include thickening of your hair, rapid growth, and changes in texture. Some women also have hair loss during or after pregnancy, or hair that feels dry or thin. Your hair will most likely return to normal after your baby is born.  What to expect at prenatal visits During a routine prenatal visit:  You will be weighed to make sure you and the baby are growing normally.  Your blood pressure will be taken.  Your abdomen will be measured to track your baby's growth.  The fetal heartbeat will be listened to between weeks 10 and 14 of your pregnancy.  Test results from any previous visits will be discussed.  Your health care provider may ask you:  How you are feeling.  If you are feeling the baby move.  If you have had any abnormal symptoms, such as leaking fluid, bleeding, severe headaches, or abdominal cramping.  If you are using any tobacco products, including cigarettes, chewing tobacco, and electronic cigarettes.  If you have any questions.  Other tests that may be performed during your first trimester include:  Blood tests to find your blood type and to check for the presence of any previous infections. The tests will also be used to check for low iron levels (anemia) and protein on red blood cells (Rh antibodies). Depending on your risk factors, or if you previously had diabetes during pregnancy, you may have tests to check for high blood sugar that affects  pregnant women (gestational diabetes).  Urine tests to check for infections, diabetes, or protein in the urine.  An ultrasound to confirm the proper growth and development of the baby.  Fetal screens for spinal cord problems (spina bifida) and Down syndrome.  HIV (human immunodeficiency virus) testing. Routine prenatal testing includes screening for HIV, unless you choose not to have this test.  You may need other tests to make sure you and the baby are doing well.  Follow these instructions at home: Medicines  Follow your health care provider's instructions regarding medicine use. Specific medicines may be either safe or unsafe to take during pregnancy.  Take a prenatal vitamin that contains at least 600 micrograms (mcg) of folic acid.  If you develop constipation, try taking a stool softener if your health care provider approves. Eating and drinking  Eat a balanced diet that includes fresh fruits and vegetables, whole grains, good sources of protein such as meat, eggs, or tofu, and low-fat dairy. Your health care provider will help you determine the amount of weight gain that is right for you.  Avoid raw meat and uncooked cheese. These carry germs that can cause birth defects in the baby.  Eating four or five small meals rather than three large meals a day may help relieve nausea and vomiting. If you start to feel nauseous, eating a few soda crackers can be helpful. Drinking liquids between meals, instead of during meals, also seems to help ease nausea and vomiting.  Limit foods that are high in fat and processed sugars, such as fried and sweet foods.  To prevent constipation: ? Eat foods that are high in fiber, such as fresh fruits and vegetables, whole grains, and beans. ? Drink enough fluid to keep your urine clear or pale yellow. Activity  Exercise only as directed by your health care provider. Most women can continue their usual exercise routine during pregnancy. Try to  exercise for 30 minutes at least 5 days a  week. Exercising will help you: ? Control your weight. ? Stay in shape. ? Be prepared for labor and delivery.  Experiencing pain or cramping in the lower abdomen or lower back is a good sign that you should stop exercising. Check with your health care provider before continuing with normal exercises.  Try to avoid standing for long periods of time. Move your legs often if you must stand in one place for a long time.  Avoid heavy lifting.  Wear low-heeled shoes and practice good posture.  You may continue to have sex unless your health care provider tells you not to. Relieving pain and discomfort  Wear a good support bra to relieve breast tenderness.  Take warm sitz baths to soothe any pain or discomfort caused by hemorrhoids. Use hemorrhoid cream if your health care provider approves.  Rest with your legs elevated if you have leg cramps or low back pain.  If you develop varicose veins in your legs, wear support hose. Elevate your feet for 15 minutes, 3-4 times a day. Limit salt in your diet. Prenatal care  Schedule your prenatal visits by the twelfth week of pregnancy. They are usually scheduled monthly at first, then more often in the last 2 months before delivery.  Write down your questions. Take them to your prenatal visits.  Keep all your prenatal visits as told by your health care provider. This is important. Safety  Wear your seat belt at all times when driving.  Make a list of emergency phone numbers, including numbers for family, friends, the hospital, and police and fire departments. General instructions  Ask your health care provider for a referral to a local prenatal education class. Begin classes no later than the beginning of month 6 of your pregnancy.  Ask for help if you have counseling or nutritional needs during pregnancy. Your health care provider can offer advice or refer you to specialists for help with various  needs.  Do not use hot tubs, steam rooms, or saunas.  Do not douche or use tampons or scented sanitary pads.  Do not cross your legs for long periods of time.  Avoid cat litter boxes and soil used by cats. These carry germs that can cause birth defects in the baby and possibly loss of the fetus by miscarriage or stillbirth.  Avoid all smoking, herbs, alcohol, and medicines not prescribed by your health care provider. Chemicals in these products affect the formation and growth of the baby.  Do not use any products that contain nicotine or tobacco, such as cigarettes and e-cigarettes. If you need help quitting, ask your health care provider. You may receive counseling support and other resources to help you quit.  Schedule a dentist appointment. At home, brush your teeth with a soft toothbrush and be gentle when you floss. Contact a health care provider if:  You have dizziness.  You have mild pelvic cramps, pelvic pressure, or nagging pain in the abdominal area.  You have persistent nausea, vomiting, or diarrhea.  You have a bad smelling vaginal discharge.  You have pain when you urinate.  You notice increased swelling in your face, hands, legs, or ankles.  You are exposed to fifth disease or chickenpox.  You are exposed to [place] measles (rubella) and have never had it. Get help right away if:  You have a fever.  You are leaking fluid from your vagina.  You have spotting or bleeding from your vagina.  You have severe abdominal cramping or pain.  You  have rapid weight gain or loss.  You vomit blood or material that looks like coffee grounds.  You develop a severe headache.  You have shortness of breath.  You have any kind of trauma, such as from a fall or a car accident. Summary  The first trimester of pregnancy is from week 1 until the end of week 13 (months 1 through 3).  Your body goes through many changes during pregnancy. The changes vary from woman to  woman.  You will have routine prenatal visits. During those visits, your health care provider will examine you, discuss any test results you may have, and talk with you about how you are feeling. This information is not intended to replace advice given to you by your health care provider. Make sure you discuss any questions you have with your health care provider. Document Released: 11/06/2001 Document Revised: 10/24/2016 Document Reviewed: 10/24/2016 Elsevier Interactive Patient Education  2017 [employer].

## 2017-07-19 LAB — CBC WITH DIFFERENTIAL/PLATELET
BASOS: 0 %
Basophils Absolute: 0 10*3/uL (ref 0.0–0.2)
EOS (ABSOLUTE): 0.4 10*3/uL (ref 0.0–0.4)
Eos: 4 %
Hematocrit: 43 % (ref 34.0–46.6)
Hemoglobin: 14.4 g/dL (ref 11.1–15.9)
IMMATURE GRANS (ABS): 0 10*3/uL (ref 0.0–0.1)
IMMATURE GRANULOCYTES: 0 %
Lymphocytes Absolute: 2.6 10*3/uL (ref 0.7–3.1)
Lymphs: 22 %
MCH: 30.1 pg (ref 26.6–33.0)
MCHC: 33.5 g/dL (ref 31.5–35.7)
MCV: 90 fL (ref 79–97)
Monocytes Absolute: 0.8 10*3/uL (ref 0.1–0.9)
Monocytes: 7 %
NEUTROS PCT: 67 %
Neutrophils Absolute: 7.9 10*3/uL — ABNORMAL HIGH (ref 1.4–7.0)
PLATELETS: 192 10*3/uL (ref 150–379)
RBC: 4.78 x10E6/uL (ref 3.77–5.28)
RDW: 13.5 % (ref 12.3–15.4)
WBC: 11.7 10*3/uL — ABNORMAL HIGH (ref 3.4–10.8)

## 2017-07-19 LAB — DRUG SCREEN 8 W/CONF, UR
Amphetamines, Urine: NEGATIVE ng/mL
BENZODIAZ UR QL: NEGATIVE ng/mL
Barbiturate screen, urine: NEGATIVE ng/mL
Buprenorphine, Urine: NEGATIVE ng/mL
COCAINE (METAB.): NEGATIVE ng/mL
Cannabinoid Quant, Ur: NEGATIVE ng/mL
METHADONE SCREEN, URINE: NEGATIVE ng/mL
OPIATE SCREEN URINE: NEGATIVE ng/mL

## 2017-07-19 LAB — URINALYSIS, ROUTINE W REFLEX MICROSCOPIC
BILIRUBIN UA: NEGATIVE
GLUCOSE, UA: NEGATIVE
Ketones, UA: NEGATIVE
Leukocytes, UA: NEGATIVE
Nitrite, UA: NEGATIVE
PROTEIN UA: NEGATIVE
RBC, UA: NEGATIVE
Specific Gravity, UA: 1.022 (ref 1.005–1.030)
UUROB: 0.2 mg/dL (ref 0.2–1.0)
pH, UA: 6.5 (ref 5.0–7.5)

## 2017-07-19 LAB — HEPATITIS C ANTIBODY: Hep C Virus Ab: <0.1 s/co ratio (ref 0.0–0.9)

## 2017-07-19 LAB — RUBELLA SCREEN: RUBELLA: 4.31 {index} (ref >0.99)

## 2017-07-19 LAB — VARICELLA ZOSTER ANTIBODY, IGG: VARICELLA: 1332 {index} (ref >165)

## 2017-07-19 LAB — ABO AND RH: Rh Factor: POSITIVE

## 2017-07-19 LAB — HEPATITIS B SURFACE ANTIGEN: Hepatitis B Surface Ag: NEGATIVE

## 2017-07-19 LAB — ANTIBODY SCREEN: Antibody Screen: NEGATIVE

## 2017-07-19 LAB — RPR: RPR: NONREACTIVE

## 2017-07-19 LAB — HIV ANTIBODY (ROUTINE TESTING W REFLEX): HIV Screen 4th Generation wRfx: NONREACTIVE

## 2017-07-19 NOTE — Progress Notes (Signed)
Orland Dec presents for NOB nurse interview visit. Pregnancy confirmation done at [facility].  G2 .  P1 . Pregnancy education material explained and given. No cats in the home. Pt does note that she has chickens and a pet duck however pt's husband handles cleaning and feces. Pt is exposed to second hand smoke as husband smokes. NOB labs ordered. HIV labs and Drug screen were explained optional and she did not decline. Drug screen ordered. PNV encouraged, currently taking gummy. Genetic screening options discussed, pt will wait to make decision as she is hoping to apply and receive medicaid. Genetic testing: Unsure. Pt desires VBAC.  Pt may discuss with provider. Pt. To follow up with provider in 4 weeks for NOB physical.  All questions answered.

## 2017-07-20 DIAGNOSIS — Z98891 History of uterine scar from previous surgery: Secondary | ICD-10-CM | POA: Insufficient documentation

## 2017-07-20 LAB — URINE CULTURE: Organism ID, Bacteria: NO GROWTH

## 2017-07-20 NOTE — Progress Notes (Signed)
I have reviewed the record and concur with patient management and plan.  Sherleen Pangborn, MD [facility]     

## 2017-07-26 ENCOUNTER — Telehealth: Payer: Self-pay | Admitting: Obstetrics and Gynecology

## 2017-07-26 NOTE — Telephone Encounter (Signed)
Called pt she states that she has been approved for pregnancy medicaid and now wants to have genetic testing. Pt scheduled for lab at 10wks.

## 2017-07-26 NOTE — Telephone Encounter (Signed)
Patient called and stated that she was told that she will be able to get testing done that will be covered my medicaid and find out the sex of the baby. Please advise. The patient did not disclose any other information.

## 2017-08-05 ENCOUNTER — Other Ambulatory Visit: Payer: BLUE CROSS/BLUE SHIELD

## 2017-08-05 DIAGNOSIS — Z1379 Encounter for other screening for genetic and chromosomal anomalies: Secondary | ICD-10-CM

## 2017-08-09 LAB — MATERNIT 21 PLUS CORE, BLOOD
Chromosome 13: NEGATIVE
Chromosome 18: NEGATIVE
Chromosome 21: NEGATIVE
Y Chromosome: DETECTED

## 2017-08-21 ENCOUNTER — Ambulatory Visit (INDEPENDENT_AMBULATORY_CARE_PROVIDER_SITE_OTHER): Payer: Medicaid Other | Admitting: Obstetrics and Gynecology

## 2017-08-21 ENCOUNTER — Encounter: Payer: Self-pay | Admitting: Obstetrics and Gynecology

## 2017-08-21 VITALS — BP 125/82 | HR 97 | Wt 158.0 lb

## 2017-08-21 DIAGNOSIS — Z3481 Encounter for supervision of other normal pregnancy, first trimester: Secondary | ICD-10-CM

## 2017-08-21 NOTE — Progress Notes (Signed)
NOB: She presents today for her new OB physical. She started care with the midwives but decided that she wanted a Dr. who could do her cesarean delivery. Since then she has decided on VBAC and strongly desires an attempt at a water birth. I sprayed her that the physicians did not do water births and that our midwives did and she requested to go back to the midwife track. She is having some family issues at this time and she says her husband and father of her babies is having an affair causing significant stress in her life. She reports no problems with her pregnancy.  Physical examination General NAD, Conversant  HEENT Atraumatic; Op clear with mmm.  Normo-cephalic. Pupils reactive. Anicteric sclerae  Thyroid/Neck Smooth without nodularity or enlargement. Normal ROM.  Neck Supple.  Skin No rashes, lesions or ulceration. Normal palpated skin turgor. No nodularity.  Breasts: No masses or discharge.  Symmetric.  No axillary adenopathy.  Lungs: Clear to auscultation.No rales or wheezes. Normal Respiratory effort, no retractions.  Heart: NSR.  No murmurs or rubs appreciated. No periferal edema  Abdomen: Soft.  Non-tender.  No masses.  No HSM. No hernia  Extremities: Moves all appropriately.  Normal ROM for age. No lymphadenopathy.  Neuro: Oriented to PPT.  Normal mood. Normal affect.     Pelvic:   Vulva: Normal appearance.  No lesions.  Vagina: No lesions or abnormalities noted.  Support: Normal pelvic support.  Urethra No masses tenderness or scarring.  Meatus Normal size without lesions or prolapse.  Cervix: Normal appearance.  No lesions.  Anus: Normal exam.  No lesions.  Perineum: Normal exam.  No lesions.        Bimanual   Adnexae: No masses.  Non-tender to palpation.  Uterus: Enlarged. 12 wks  Non-tender.  Mobile.  AV.  Adnexae: No masses.  Non-tender to palpation.  Cul-de-sac: Negative for abnormality.  Adnexae: No masses.  Non-tender to palpation.         Pelvimetry   Diagonal:  Reached.  Spines: Average.  Sacrum: Concave.  Pubic Arch: Normal.   Pap, GC/CT performed. Discussed maternity 21 -normal Desires AFP for spina bifida at next visit.

## 2017-08-23 LAB — PAP IG, CT-NG, RFX HPV ASCU
CHLAMYDIA, NUC. ACID AMP: NEGATIVE
Gonococcus by Nucleic Acid Amp: NEGATIVE
PAP Smear Comment: 0

## 2017-09-18 ENCOUNTER — Ambulatory Visit (INDEPENDENT_AMBULATORY_CARE_PROVIDER_SITE_OTHER): Payer: Medicaid Other | Admitting: Certified Nurse Midwife

## 2017-09-18 VITALS — BP 114/69 | HR 96 | Wt 159.3 lb

## 2017-09-18 DIAGNOSIS — Z3482 Encounter for supervision of other normal pregnancy, second trimester: Secondary | ICD-10-CM

## 2017-09-18 DIAGNOSIS — Z23 Encounter for immunization: Secondary | ICD-10-CM

## 2017-09-18 LAB — POCT URINALYSIS DIPSTICK
Bilirubin, UA: NEGATIVE
Glucose, UA: NEGATIVE
Ketones, UA: NEGATIVE
LEUKOCYTES UA: NEGATIVE
NITRITE UA: NEGATIVE
PH UA: 6 (ref 5.0–8.0)
RBC UA: NEGATIVE
Spec Grav, UA: 1.015 (ref 1.010–1.025)
UROBILINOGEN UA: 0.2 U/dL

## 2017-09-18 NOTE — Progress Notes (Signed)
[name], discussed water birth with TOLAC. ACNM position statement and international water birth information given. Reviewed need for continuous monitoring given her history of c/s and that [facility] resistants monitors. Will follow up with L&D to see if that will be available in the near future. Will also [facility] regarding monitoring for [facility]OL[facility]. Pt to follow up in 4 wks for anatomy u/s and [name].   Doreene BurkeAnnie Jimmey Hengel, CNM

## 2017-09-18 NOTE — Patient Instructions (Addendum)
How a Baby Grows During Pregnancy Pregnancy begins when a female's sperm enters a female's egg (fertilization). This happens in one of the tubes (fallopian tubes) that connect the ovaries to the womb (uterus). The fertilized egg is called an embryo until it reaches 10 weeks. From 10 weeks until birth, it is called a fetus. The fertilized egg moves down the fallopian tube to the uterus. Then it implants into the lining of the uterus and begins to grow. The developing fetus receives oxygen and nutrients through the pregnant woman's bloodstream and the tissues that grow (placenta) to support the fetus. The placenta is the life support system for the fetus. It provides nutrition and removes waste. Learning as much as you can about your pregnancy and how your baby is developing can help you enjoy the experience. It can also make you aware of when there might be a problem and when to ask questions. How long does a typical pregnancy last? A pregnancy usually lasts 280 days, or about 40 weeks. Pregnancy is divided into three trimesters:  First trimester: 0-13 weeks.  Second trimester: 14-27 weeks.  Third trimester: 28-40 weeks.  The day when your baby is considered ready to be born (full term) is your estimated date of delivery. How does my baby develop month by month? First month  The fertilized egg attaches to the inside of the uterus.  Some cells will form the placenta. Others will form the fetus.  The arms, legs, brain, spinal cord, lungs, and heart begin to develop.  At the end of the first month, the heart begins to beat.  Second month  The bones, inner ear, eyelids, hands, and feet form.  The genitals develop.  By the end of 8 weeks, all major organs are developing.  Third month  All of the internal organs are forming.  Teeth develop below the gums.  Bones and muscles begin to grow. The spine can flex.  The skin is transparent.  Fingernails and toenails begin to form.  Arms  and legs continue to grow longer, and hands and feet develop.  The fetus is about 3 in (7.6 cm) long.  Fourth month  The placenta is completely formed.  The external sex organs, neck, outer ear, eyebrows, eyelids, and fingernails are formed.  The fetus can hear, swallow, and move its arms and legs.  The kidneys begin to produce urine.  The skin is covered with a white waxy coating (vernix) and very fine hair (lanugo).  Fifth month  The fetus moves around more and can be felt for the first time (quickening).  The fetus starts to sleep and wake up and may begin to suck its finger.  The nails grow to the end of the fingers.  The organ in the digestive system that makes bile (gallbladder) functions and helps to digest the nutrients.  If your baby is a girl, eggs are present in her ovaries. If your baby is a boy, testicles start to move down into his scrotum.  Sixth month  The lungs are formed, but the fetus is not yet able to breathe.  The eyes open. The brain continues to develop.  Your baby has fingerprints and toe prints. Your baby's hair grows thicker.  At the end of the second trimester, the fetus is about 9 in (22.9 cm) long.  Seventh month  The fetus kicks and stretches.  The eyes are developed enough to sense changes in light.  The hands can make a grasping motion.  The   fetus responds to sound.  Eighth month  All organs and body systems are fully developed and functioning.  Bones harden and taste buds develop. The fetus may hiccup.  Certain areas of the brain are still developing. The skull remains soft.  Ninth month  The fetus gains about  lb (0.23 kg) each week.  The lungs are fully developed.  Patterns of sleep develop.  The fetus's head typically moves into a head-down position (vertex) in the uterus to prepare for birth. If the buttocks move into a vertex position instead, the baby is breech.  The fetus weighs 6-9 lbs (2.72-4.08 kg) and is  19-20 in (48.26-50.8 cm) long.  What can I do to have a healthy pregnancy and help my baby develop? Eating and Drinking  Eat a healthy diet. ? Talk with your health care provider to make sure that you are getting the nutrients that you and your baby need. ? Visit www.choosemyplate.gov to learn about creating a healthy diet.  Gain a healthy amount of weight during pregnancy as advised by your health care provider. This is usually 25-35 pounds. You may need to: ? Gain more if you were underweight before getting pregnant or if you are pregnant with more than one baby. ? Gain less if you were overweight or obese when you got pregnant.  Medicines and Vitamins  Take prenatal vitamins as directed by your health care provider. These include vitamins such as folic acid, iron, calcium, and vitamin D. They are important for healthy development.  Take medicines only as directed by your health care provider. Read labels and ask a pharmacist or your health care provider whether over-the-counter medicines, supplements, and prescription drugs are safe to take during pregnancy.  Activities  Be physically active as advised by your health care provider. Ask your health care provider to recommend activities that are safe for you to do, such as walking or swimming.  Do not participate in strenuous or extreme sports.  Lifestyle  Do not drink alcohol.  Do not use any tobacco products, including cigarettes, chewing tobacco, or electronic cigarettes. If you need help quitting, ask your health care provider.  Do not use illegal drugs.  Safety  Avoid exposure to mercury, lead, or other heavy metals. Ask your health care provider about common sources of these heavy metals.  Avoid listeria infection during pregnancy. Follow these precautions: ? Do not eat soft cheeses or deli meats. ? Do not eat hot dogs unless they have been warmed up to the point of steaming, such as in the microwave oven. ? Do not  drink unpasteurized milk.  Avoid toxoplasmosis infection during pregnancy. Follow these precautions: ? Do not change your cat's litter box, if you have a cat. Ask someone else to do this for you. ? Wear gardening gloves while working in the yard.  General Instructions  Keep all follow-up visits as directed by your health care provider. This is important. This includes prenatal care and screening tests.  Manage any chronic health conditions. Work closely with your health care provider to keep conditions, such as diabetes, under control.  How do I know if my baby is developing well? At each prenatal visit, your health care provider will do several different tests to check on your health and keep track of your baby's development. These include:  Fundal height. ? Your health care provider will measure your growing belly from top to bottom using a tape measure. ? Your health care provider will also feel your belly   to determine your baby's position.  Heartbeat. ? An ultrasound in the first trimester can confirm pregnancy and show a heartbeat, depending on how far along you are. ? Your health care provider will check your baby's heart rate at every prenatal visit. ? As you get closer to your delivery date, you may have regular fetal heart rate monitoring to make sure that your baby is not in distress.  Second trimester ultrasound. ? This ultrasound checks your baby's development. It also indicates your baby's gender.  What should I do if I have concerns about my baby's development? Always talk with your health care provider about any concerns that you may have. This information is not intended to replace advice given to you by your health care provider. Make sure you discuss any questions you have with your health care provider. Document Released: 04/30/2008 Document Revised: 04/19/2016 Document Reviewed: 04/21/2014 Elsevier Interactive Patient Education  2018 [employer] Round Ligament  Pain The round ligament is a cord of muscle and tissue that helps to support the uterus. It can become a source of pain during pregnancy if it becomes stretched or twisted as the baby grows. The pain usually begins in the second trimester of pregnancy, and it can come and go until the baby is delivered. It is not a serious problem, and it does not cause harm to the baby. Round ligament pain is usually a short, sharp, and pinching pain, but it can also be a dull, lingering, and aching pain. The pain is felt in the lower side of the abdomen or in the groin. It usually starts deep in the groin and moves up to the outside of the hip area. Pain can occur with:  A sudden change in position.  Rolling over in bed.  Coughing or sneezing.  Physical activity.  Follow these instructions at home: Watch your condition for any changes. Take these steps to help with your pain:  When the pain starts, relax. Then try: ? Sitting down. ? Flexing your knees up to your abdomen. ? Lying on your side with one pillow under your abdomen and another pillow between your legs. ? Sitting in a warm bath for 15-20 minutes or until the pain goes away.  Take over-the-counter and prescription medicines only as told by your health care provider.  Move slowly when you sit and stand.  Avoid long walks if they cause pain.  Stop or lessen your physical activities if they cause pain.  Contact a health care provider if:  Your pain does not go away with treatment.  You feel pain in your back that you did not have before.  Your medicine is not helping. Get help right away if:  You develop a fever or chills.  You develop uterine contractions.  You develop vaginal bleeding.  You develop nausea or vomiting.  You develop diarrhea.  You have pain when you urinate. This information is not intended to replace advice given to you by your health care provider. Make sure you discuss any questions you have with your  health care provider. Document Released: 08/21/2008 Document Revised: 04/19/2016 Document Reviewed: 01/19/2015 Elsevier Interactive Patient Education  [address]2018 [employer] [facility]birth Class  May 08, 2017  Wednesday 7:00p - 9:00p  [facility] [place]  June 12, 2017  Wednesday 7:00p - 9:00p [facility] [place]    July 17, 2017   Wednesday 7:00p - 9:000p [facility] [place]  August 14, 2017  Wednesday  7:00p - 9:00p [facility] [place], [place]  September 11, 2017 Wednesday 7:00p - 9:00p [facility] [place], [place]  Interested in a waterbirth?  This informational class will help you discover whether waterbirth is the right fit for you.  Education about waterbirth itself, supplies you would need and how to assemble your support team is what you can expect from this class.  Some obstetrical practices require this class in order to pursue a waterbirth.  (Not all obstetrical practices offer waterbirth check with your healthcare provider)  Register only the expectant mom, but you are encouraged to bring your partner to class!  Fees & Payment No fee  Register Online [email or web address]  Search [place]

## 2017-09-20 ENCOUNTER — Telehealth: Payer: Self-pay | Admitting: Certified Nurse Midwife

## 2017-09-20 NOTE — Telephone Encounter (Signed)
Pt informed of water birth options given history of previous c- section. See my chart message sent.   Doreene BurkeAnnie Tamsin Nader

## 2017-10-01 ENCOUNTER — Other Ambulatory Visit: Payer: Self-pay | Admitting: Certified Nurse Midwife

## 2017-10-01 DIAGNOSIS — Z369 Encounter for antenatal screening, unspecified: Secondary | ICD-10-CM

## 2017-10-22 ENCOUNTER — Ambulatory Visit (INDEPENDENT_AMBULATORY_CARE_PROVIDER_SITE_OTHER): Payer: Medicaid Other | Admitting: Obstetrics and Gynecology

## 2017-10-22 ENCOUNTER — Ambulatory Visit (INDEPENDENT_AMBULATORY_CARE_PROVIDER_SITE_OTHER): Payer: Medicaid Other

## 2017-10-22 VITALS — BP 102/75 | HR 86 | Wt 164.0 lb

## 2017-10-22 DIAGNOSIS — Z3482 Encounter for supervision of other normal pregnancy, second trimester: Secondary | ICD-10-CM

## 2017-10-22 DIAGNOSIS — Z369 Encounter for antenatal screening, unspecified: Secondary | ICD-10-CM | POA: Diagnosis not present

## 2017-10-22 DIAGNOSIS — Z3492 Encounter for supervision of normal pregnancy, unspecified, second trimester: Secondary | ICD-10-CM | POA: Diagnosis not present

## 2017-10-22 LAB — POCT URINALYSIS DIPSTICK
Bilirubin, UA: NEGATIVE
Blood, UA: NEGATIVE
Glucose, UA: NEGATIVE
KETONES UA: NEGATIVE
LEUKOCYTES UA: NEGATIVE
Nitrite, UA: NEGATIVE
PH UA: 6 (ref 5.0–8.0)
PROTEIN UA: NEGATIVE
Spec Grav, UA: 1.01 (ref 1.010–1.025)
UROBILINOGEN UA: 0.2 U/dL

## 2017-10-22 NOTE — Progress Notes (Signed)
[name]-reports lower abdominal pains at incision- recommend belly band reviewed u/s Indications: Anatomy Findings:  Singleton intrauterine pregnancy is visualized with FHR at 126 BPM. Biometrics give an (U/S) Gestational age of 28 3/7 weeks and an (U/S) EDD of 03/01/18; this correlates with the clinically established EDD of 03/05/18.  Fetal presentation is breech.  EFW: 436 grams (0lb 15oz). Placenta: Posterior and grade 1.  Placenta is 6.2 cm from cervical os. AFI: WNL subjectively.  Anatomic survey is complete and appears WNL; Gender - Female.   Right Ovary measures 2.1 x 2.0 x 1.2 cm and appears WNL. Left Ovary was not visualized due to overlying bowel gas. There is no obvious evidence of a corpus luteal cyst. Survey of the adnexa demonstrates no adnexal masses. There is no free peritoneal fluid in the cul de sac.  Impression: 1. 21 3/7 week Viable Singleton Intrauterine pregnancy by U/S. 2. (U/S) EDD is consistent with Clinically established (LMP) EDD of 03/05/18. 3. Normal Anatomy Scan

## 2017-10-22 NOTE — Progress Notes (Signed)
[name]- pt "has been having spells", feels like her BP gets elevated, sometimes she feels like she is going to pass out, also having pains in her lower abdomen

## 2017-11-01 ENCOUNTER — Encounter: Payer: Self-pay | Admitting: Certified Nurse Midwife

## 2017-11-01 ENCOUNTER — Ambulatory Visit (INDEPENDENT_AMBULATORY_CARE_PROVIDER_SITE_OTHER): Payer: Medicaid Other | Admitting: Certified Nurse Midwife

## 2017-11-01 ENCOUNTER — Telehealth: Payer: Self-pay | Admitting: *Deleted

## 2017-11-01 VITALS — BP 122/69 | HR 88 | Wt 169.6 lb

## 2017-11-01 DIAGNOSIS — Z3482 Encounter for supervision of other normal pregnancy, second trimester: Secondary | ICD-10-CM

## 2017-11-01 DIAGNOSIS — R102 Pelvic and perineal pain: Secondary | ICD-10-CM | POA: Diagnosis not present

## 2017-11-01 LAB — POCT URINALYSIS DIPSTICK
BILIRUBIN UA: NEGATIVE
GLUCOSE UA: NEGATIVE
KETONES UA: NEGATIVE
Nitrite, UA: NEGATIVE
RBC UA: NEGATIVE
SPEC GRAV UA: 1.01 (ref 1.010–1.025)
Urobilinogen, UA: 0.2 E.U./dL
pH, UA: 7.5 (ref 5.0–8.0)

## 2017-11-01 NOTE — Patient Instructions (Signed)
Common Medications Safe in Pregnancy  Acne:      Constipation:  Benzoyl Peroxide     Colace  Clindamycin      Dulcolax Suppository  Topica Erythromycin     Fibercon  Salicylic Acid      Metamucil         Miralax AVOID:        Senakot   Accutane    Cough:  Retin-A       Cough Drops  Tetracycline      Phenergan w/ Codeine if Rx  Minocycline      Robitussin (Plain & DM)  Antibiotics:     Crabs/Lice:  Ceclor       RID  Cephalosporins    AVOID:  E-Mycins      Kwell  Keflex  Macrobid/Macrodantin   Diarrhea:  Penicillin      Kao-Pectate  Zithromax      Imodium AD         PUSH FLUIDS AVOID:       Cipro     Fever:  Tetracycline      Tylenol (Regular or Extra  Minocycline       Strength)  Levaquin      Extra Strength-Do not          Exceed 8 tabs/24 hrs Caffeine:        <200mg/day (equiv. To 1 cup of coffee or  approx. 3 12 oz sodas)         Gas: Cold/Hayfever:       Gas-X  Benadryl      Mylicon  Claritin       Phazyme  **Claritin-D        Chlor-Trimeton    Headaches:  Dimetapp      ASA-Free Excedrin  Drixoral-Non-Drowsy     Cold Compress  Mucinex (Guaifenasin)     Tylenol (Regular or Extra  Sudafed/Sudafed-12 Hour     Strength)  **Sudafed PE Pseudoephedrine   Tylenol Cold & Sinus     Vicks Vapor Rub  Zyrtec  **AVOID if Problems With Blood Pressure         Heartburn: Avoid lying down for at least 1 hour after meals  Aciphex      Maalox     Rash:  Milk of Magnesia     Benadryl    Mylanta       1% Hydrocortisone Cream  Pepcid  Pepcid Complete   Sleep Aids:  Prevacid      Ambien   Prilosec       Benadryl  Rolaids       Chamomile Tea  Tums (Limit 4/day)     Unisom  Zantac       Tylenol PM         Warm milk-add vanilla or  Hemorrhoids:       Sugar for taste  Anusol/Anusol H.C.  (RX: Analapram 2.5%)  Sugar Substitutes:  Hydrocortisone OTC     Ok in moderation  Preparation H      Tucks        Vaseline lotion applied to tissue with  wiping    Herpes:     Throat:  Acyclovir      Oragel  Famvir  Valtrex     Vaccines:         Flu Shot Leg Cramps:       *Gardasil  Benadryl      Hepatitis A         Hepatitis B Nasal Spray:         Pneumovax  Saline Nasal Spray     Polio Booster         Tetanus Nausea:       Tuberculosis test or PPD  Vitamin B6 25 mg TID   AVOID:    Dramamine      *Gardasil  Emetrol       Live Poliovirus  Ginger Root 250 mg QID    MMR (measles, mumps &  High Complex Carbs @ Bedtime    rebella)  Sea Bands-Accupressure    Varicella (Chickenpox)  Unisom 1/2 tab TID     *No known complications           If received before Pain:         Known pregnancy;   Darvocet       Resume series after  Lortab        Delivery  Percocet    Yeast:   Tramadol      Femstat  Tylenol 3      Gyne-lotrimin  Ultram       Monistat  Vicodin           MISC:         All Sunscreens           Hair Coloring/highlights          Insect Repellant's          (Including DEET)         Mystic Tans Round Ligament Pain The round ligament is a cord of muscle and tissue that helps to support the uterus. It can become a source of pain during pregnancy if it becomes stretched or twisted as the baby grows. The pain usually begins in the second trimester of pregnancy, and it can come and go until the baby is delivered. It is not a serious problem, and it does not cause harm to the baby. Round ligament pain is usually a short, sharp, and pinching pain, but it can also be a dull, lingering, and aching pain. The pain is felt in the lower side of the abdomen or in the groin. It usually starts deep in the groin and moves up to the outside of the hip area. Pain can occur with:  A sudden change in position.  Rolling over in bed.  Coughing or sneezing.  Physical activity.  Follow these instructions at home: Watch your condition for any changes. Take these steps to help with your pain:  When the pain starts, relax. Then try: ? Sitting  down. ? Flexing your knees up to your abdomen. ? Lying on your side with one pillow under your abdomen and another pillow between your legs. ? Sitting in a warm bath for 15-20 minutes or until the pain goes away.  Take over-the-counter and prescription medicines only as told by your health care provider.  Move slowly when you sit and stand.  Avoid long walks if they cause pain.  Stop or lessen your physical activities if they cause pain.  Contact a health care provider if:  Your pain does not go away with treatment.  You feel pain in your back that you did not have before.  Your medicine is not helping. Get help right away if:  You develop a fever or chills.  You develop uterine contractions.  You develop vaginal bleeding.  You develop nausea or vomiting.  You develop diarrhea.  You have pain when you urinate. This information is not intended to replace advice given to you by your health  care provider. Make sure you discuss any questions you have with your health care provider. Document Released: 08/21/2008 Document Revised: 04/19/2016 Document Reviewed: 01/19/2015 Elsevier Interactive Patient Education  2018 [place]. Back Pain in Pregnancy Back pain during pregnancy is common. Back pain may be caused by several factors that are related to changes during your pregnancy. Follow these instructions at home: Managing pain, stiffness, and swelling  If directed, apply ice for sudden (acute) back pain. ? Put ice in a plastic bag. ? Place a towel between your skin and the bag. ? Leave the ice on for 20 minutes, 2-3 times per day.  If directed, apply heat to the affected area before you exercise: ? Place a towel between your skin and the heat pack or heating pad. ? Leave the heat on for 20-30 minutes. ? Remove the heat if your skin turns bright red. This is especially important if you are unable to feel pain, heat, or cold. You may have a greater risk of getting  burned. Activity  Exercise as told by your health care provider. Exercising is the best way to prevent or manage back pain.  Listen to your body when lifting. If lifting hurts, ask for help or bend your knees. This uses your leg muscles instead of your back muscles.  Squat down when picking up something from the floor. Do not bend over.  Only use bed rest as told by your health care provider. Bed rest should only be used for the most severe episodes of back pain. Standing, Sitting, and Lying Down  Do not stand in one place for long periods of time.  Use good posture when sitting. Make sure your head rests over your shoulders and is not hanging forward. Use a pillow on your lower back if necessary.  Try sleeping on your side, preferably the left side, with a pillow or two between your legs. If you are sore after a night's rest, your bed may be too soft. A firm mattress may provide more support for your back during pregnancy. General instructions  Do not wear high heels.  Eat a healthy diet. Try to gain weight within your health care provider's recommendations.  Use a maternity girdle, elastic sling, or back brace as told by your health care provider.  Take over-the-counter and prescription medicines only as told by your health care provider.  Keep all follow-up visits as told by your health care provider. This is important. This includes any visits with any specialists, such as a physical therapist. Contact a health care provider if:  Your back pain interferes with your daily activities.  You have increasing pain in other parts of your body. Get help right away if:  You develop numbness, tingling, weakness, or problems with the use of your arms or legs.  You develop severe back pain that is not controlled with medicine.  You have a sudden change in bowel or bladder control.  You develop shortness of breath, dizziness, or you faint.  You develop nausea, vomiting, or  sweating.  You have back pain that is a rhythmic, cramping pain similar to labor pains. Labor pain is usually 1-2 minutes apart, lasts for about 1 minute, and involves a bearing down feeling or pressure in your pelvis.  You have back pain and your water breaks or you have vaginal bleeding.  You have back pain or numbness that travels down your leg.  Your back pain developed after you fell.  You develop pain on one side  of your back.  You see blood in your urine.  You develop skin blisters in the area of your back pain. This information is not intended to replace advice given to you by your health care provider. Make sure you discuss any questions you have with your health care provider. Document Released: 02/20/2006 Document Revised: 04/19/2016 Document Reviewed: 07/27/2015 Elsevier Interactive Patient Education  2018 [place]. Abdominal Pain During Pregnancy Belly (abdominal) pain is common during pregnancy. Most of the time, it is not a serious problem. Other times, it can be a sign that something is wrong with the pregnancy. Always tell your doctor if you have belly pain. Follow these instructions at home: Monitor your belly pain for any changes. The following actions may help you feel better:  Do not have sex (intercourse) or put anything in your vagina until you feel better.  Rest until your pain stops.  Drink clear fluids if you feel sick to your stomach (nauseous). Do not eat solid food until you feel better.  Only take medicine as told by your doctor.  Keep all doctor visits as told.  Get help right away if:  You are bleeding, leaking fluid, or pieces of tissue come out of your vagina.  You have more pain or cramping.  You keep throwing up (vomiting).  You have pain when you pee (urinate) or have blood in your pee.  You have a fever.  You do not feel your baby moving as much.  You feel very weak or feel like passing out.  You have trouble breathing, with  or without belly pain.  You have a very bad headache and belly pain.  You have fluid leaking from your vagina and belly pain.  You keep having watery poop (diarrhea).  Your belly pain does not go away after resting, or the pain gets worse. This information is not intended to replace advice given to you by your health care provider. Make sure you discuss any questions you have with your health care provider. Document Released: 10/31/2009 Document Revised: 06/20/2016 Document Reviewed: 06/11/2013 Elsevier Interactive Patient Education  [employer].

## 2017-11-01 NOTE — Telephone Encounter (Signed)
Error

## 2017-11-01 NOTE — Progress Notes (Signed)
OB w/i- abd pain x 2days. Lifted son who weighs 52#. Increase in vd. Baby not as active.  Tylenol not helping.

## 2017-11-03 LAB — URINE CULTURE

## 2017-11-05 NOTE — Progress Notes (Signed)
Subjective:   Terri Holt is a 26 y.o. G2P1001 7616w2d being seen today for work in problem obstetrical visit.  Patient reports intermittent abdominal pain, increased vaginal discharge, and decreased fetal movement for the last two (2) days.  Denies contractions, vaginal bleeding or leaking of fluid.   Denies difficulty breathing or respiratory distress, chest pain, dysuria, and leg pain or swelling.   The following portions of the patient's history were reviewed and updated as appropriate: allergies, current medications, past family history, past medical history, past social history, past surgical history and problem list.   Objective:   BP 122/69   Pulse 88   Wt 169 lb 9.6 oz (76.9 kg)   LMP 04/26/2017 (Approximate) Comment: spotted, hx irreg. menses  BMI 26.56 kg/m   FHT: Fetal Heart Rate (bpm): 151  Uterine Size: Fundal Height: 22 cm  Fetal Movement: Movement: Present    Abdomen:  Soft, gravid, appropriate for gestational age,non-tender  Vaginal:  Clear discharge present  Cervix: Visually closed/thick   No results found for this or any previous visit (from the past 24 hour(s)).  Assessment:   Pregnancy:  G2P1001 at 3316w2d  1. Encounter for supervision of other normal pregnancy in second trimester  - POCT urinalysis dipstick  2. Pelvic pain  - Urine Culture  Plan:   Reassurance provided.   Discussed home treatment measures including the use of abdominal support, OTC medications, and warm moist heat.   Preterm labor symptoms: vaginal bleeding, contractions and leaking of fluid reviewed in detail.  Fetal movement precautions reviewed.  Follow up in 3 weeks as previously scheduled or sooner if needed.   Gunnar BullaJenkins Michelle Lawhorn, CNM [facility], [facility]

## 2017-11-21 ENCOUNTER — Ambulatory Visit (INDEPENDENT_AMBULATORY_CARE_PROVIDER_SITE_OTHER): Payer: Medicaid Other | Admitting: Certified Nurse Midwife

## 2017-11-21 ENCOUNTER — Encounter: Payer: Self-pay | Admitting: Certified Nurse Midwife

## 2017-11-21 VITALS — BP 107/70 | HR 101 | Wt 169.6 lb

## 2017-11-21 DIAGNOSIS — Z130 Encounter for screening for diseases of the blood and blood-forming organs and certain disorders involving the immune mechanism: Secondary | ICD-10-CM

## 2017-11-21 DIAGNOSIS — Z3482 Encounter for supervision of other normal pregnancy, second trimester: Secondary | ICD-10-CM

## 2017-11-21 DIAGNOSIS — Z131 Encounter for screening for diabetes mellitus: Secondary | ICD-10-CM

## 2017-11-21 DIAGNOSIS — Z113 Encounter for screening for infections with a predominantly sexual mode of transmission: Secondary | ICD-10-CM

## 2017-11-21 LAB — POCT URINALYSIS DIPSTICK
Bilirubin, UA: NEGATIVE
GLUCOSE UA: NEGATIVE
Ketones, UA: NEGATIVE
NITRITE UA: NEGATIVE
RBC UA: NEGATIVE
Spec Grav, UA: 1.01 (ref 1.010–1.025)
Urobilinogen, UA: 0.2 E.U./dL
pH, UA: 8 (ref 5.0–8.0)

## 2017-11-21 MED ORDER — HYDROXYZINE HCL 25 MG PO TABS: 25.0000 mg | ORAL_TABLET | Freq: Four times a day (QID) | ORAL | 2 refills | Status: DC | PRN

## 2017-11-21 NOTE — Progress Notes (Signed)
[name]- Pt states she is still having dizzy spells, she states it is happening more frequently, sometimes causing her to have nausea, she did fall on her knees yesterday due to dizzy spell

## 2017-11-21 NOTE — Patient Instructions (Addendum)
Orthostatic Hypotension Orthostatic hypotension is a sudden drop in blood pressure that happens when you quickly change positions, such as when you get up from a seated or lying position. Blood pressure is a measurement of how strongly, or weakly, your blood is pressing against the walls of your arteries. Arteries are blood vessels that carry blood from your heart throughout your body. When blood pressure is too low, you may not get enough blood to your brain or to the rest of your organs. This can cause weakness, light-headedness, rapid heartbeat, and fainting. This can last for just a few seconds or for up to a few minutes. Orthostatic hypotension is usually not a serious problem. However, if it happens frequently or gets worse, it may be a sign of something more serious. What are the causes? This condition may be caused by:  Sudden changes in posture, such as standing up quickly after you have been sitting or lying down.  Blood loss.  Loss of body fluids (dehydration).  Heart problems.  Hormone (endocrine) problems.  Pregnancy.  Severe infection.  Lack of certain nutrients.  Severe allergic reactions (anaphylaxis).  Certain medicines, such as blood pressure medicine or medicines that make the body lose excess fluids (diuretics). Sometimes, this condition can be caused by not taking medicine as directed, such as taking too much of a certain medicine.  What increases the risk? Certain factors can make you more likely to develop orthostatic hypotension, including:  Age. Risk increases as you get older.  Conditions that affect the heart or the central nervous system.  Taking certain medicines, such as blood pressure medicine or diuretics.  Being pregnant.  What are the signs or symptoms? Symptoms of this condition may include:  Weakness.  Light-headedness.  Dizziness.  Blurred vision.  Fatigue.  Rapid heartbeat.  Fainting, in severe cases.  How is this  diagnosed? This condition is diagnosed based on:  Your medical history.  Your symptoms.  Your blood pressure measurement. Your health care provider will check your blood pressure when you are: ? Lying down. ? Sitting. ? Standing.  A blood pressure reading is recorded as two numbers, such as "120 over 80" (or 120/80). The first ("top") number is called the systolic pressure. It is a measure of the pressure in your arteries as your heart beats. The second ("bottom") number is called the diastolic pressure. It is a measure of the pressure in your arteries when your heart relaxes between beats. Blood pressure is measured in a unit called mm Hg. Healthy blood pressure for adults is 120/80. If your blood pressure is below 90/60, you may be diagnosed with hypotension. Other information or tests that may be used to diagnose orthostatic hypotension include:  Your other vital signs, such as your heart rate and temperature.  Blood tests.  Tilt table test. For this test, you will be safely secured to a table that moves you from a lying position to an upright position. Your heart rhythm and blood pressure will be monitored during the test.  How is this treated? Treatment for this condition may include:  Changing your diet. This may involve eating more salt (sodium) or drinking more water.  Taking medicines to raise your blood pressure.  Changing the dosage of certain medicines you are taking that might be lowering your blood pressure.  Wearing compression stockings. These stockings help to prevent blood clots and reduce swelling in your legs.  In some cases, you may need to go to the hospital for:    Fluid replacement. This means you will receive fluids through an IV tube.  Blood replacement. This means you will receive donated blood through an IV tube (transfusion).  Treating an infection or heart problems, if this applies.  Monitoring. You may need to be monitored while medicines that you  are taking wear off.  Follow these instructions at home: Eating and drinking   Drink enough fluid to keep your urine clear or pale yellow.  Eat a healthy diet and follow instructions from your health care provider about eating or drinking restrictions. A healthy diet includes: ? Fresh fruits and vegetables. ? Whole grains. ? Lean meats. ? Low-fat dairy products.  Eat extra salt only as directed. Do not add extra salt to your diet unless your health care provider told you to do that.  Eat frequent, small meals.  Avoid standing up suddenly after eating. Medicines  Take over-the-counter and prescription medicines only as told by your health care provider. ? Follow instructions from your health care provider about changing the dosage of your current medicines, if this applies. ? Do not stop or adjust any of your medicines on your own. General instructions  Wear compression stockings as told by your health care provider.  Get up slowly from lying down or sitting positions. This gives your blood pressure a chance to adjust.  Avoid hot showers and excessive heat as directed by your health care provider.  Return to your normal activities as told by your health care provider. Ask your health care provider what activities are safe for you.  Do not use any products that contain nicotine or tobacco, such as cigarettes and e-cigarettes. If you need help quitting, ask your health care provider.  Keep all follow-up visits as told by your health care provider. This is important. Contact a health care provider if:  You vomit.  You have diarrhea.  You have a fever for more than 2-3 days.  You feel more thirsty than usual.  You feel weak and tired. Get help right away if:  You have chest pain.  You have a fast or irregular heartbeat.  You develop numbness in any part of your body.  You cannot move your arms or your legs.  You have trouble speaking.  You become sweaty or feel  lightheaded.  You faint.  You feel short of breath.  You have trouble staying awake.  You feel confused. This information is not intended to replace advice given to you by your health care provider. Make sure you discuss any questions you have with your health care provider. Document Released: 11/02/2002 Document Revised: 07/31/2016 Document Reviewed: 05/04/2016 Elsevier Interactive Patient Education  2018 [employer]. Trial of Labor After Cesarean Delivery A trial of labor after cesarean delivery (TOLAC) is when a woman tries to give birth vaginally after a previous cesarean delivery. TOLAC may be a safe and appropriate option for you depending on your medical history and other risk factors. When TOLAC is successful and you are able to have a vaginal delivery, this is called a vaginal birth after cesarean delivery (VBAC). Candidates for TOLAC TOLAC is possible for some women who:  Have undergone one or two prior cesarean deliveries in which the incision of the uterus was horizontal (low transverse).  Are carrying twins and have had one prior low transverse incision during a cesarean delivery.  Do not have a vertical (classical) uterine scar.  Have not had a tear in the wall of their uterus (uterine rupture).  TOLAC  is also supported for women who meet appropriate criteria and:  Are under the age of 40 years.  Are tall and have a body mass index (BMI) of less than 30.  Have an unknown uterine scar.  Give birth in a facility equipped to handle an emergency cesarean delivery. This team should be able to handle possible complications such as a uterine rupture.  Have thorough counseling about the benefits and risks of TOLAC.  Have discussed future pregnancy plans with their health care provider.  Plan to have several more pregnancies.  Most successful candidates for TOLAC:  Have had a successful vaginal delivery before or after their cesarean delivery.  Experience labor  that begins naturally on or before the due date (40 weeks of gestation).  Do not have a very large (macrosomic) baby.  Had a prior cesarean delivery but are not currently experiencing factors that would prompt a cesarean delivery (such as a breech position).  Had only one prior cesarean delivery.  Had a prior cesarean delivery that was performed early in labor and not after full cervical dilation. TOLAC may be most appropriate for women who meet the above guidelines and who plan to have more pregnancies. TOLAC is not recommended for home births. Least successful candidates for TOLAC:  Have an induced labor with an unfavorable cervix. An unfavorable cervix is when the cervix is not dilating enough (among other factors).  Have never had a vaginal delivery.  Have had more than two cesarean deliveries.  Have a pregnancy at more than 40 weeks of gestation.  Are pregnant with a baby with a suspected weight greater than 4,000 grams (8 pounds) and who have no prior history of a vaginal delivery.  Have closely spaced pregnancies. Suggested benefits of TOLAC  You may have a faster recovery time.  You may have a shorter stay in the hospital.  You may have less pain and fewer problems than with a cesarean delivery. Women who have a cesarean delivery have a higher chance of needing blood or getting a fever, an infection, or a blood clot in the legs. Suggested risks of TOLAC The highest risk of complications happens to women who attempt a TOLAC and fail. A failed TOLAC results in an unplanned cesarean delivery. Risks related to [facility] or repeat cesarean deliveries include:  Blood loss.  Infection.  Blood clot.  Injury to surrounding tissues or organs.  Having to remove the uterus (hysterectomy).  Potential problems with the placenta (such as placenta previa or placenta accreta) in future pregnancies.  Although very rare, the main concerns with TOLAC are:  Rupture of the uterine scar  from a past cesarean delivery.  Needing an emergency cesarean delivery.  Having a bad outcome for the baby (perinatal morbidity).  Where to find more information:  American Congress of Obstetricians and Gynecologists: www.acog.org  [employer]: www.midwife.org This information is not intended to replace advice given to you by your health care provider. Make sure you discuss any questions you have with your health care provider. Document Released: 07/31/2011 Document Revised: 10/10/2016 Document Reviewed: 05/04/2013 Elsevier Interactive Patient Education  2018 [employer] Hydroxyzine capsules or tablets What is this medicine? HYDROXYZINE (hye DROX i zeen) is an antihistamine. This medicine is used to treat allergy symptoms. It is also used to treat anxiety and tension. This medicine can be used with other medicines to induce sleep before surgery. This medicine may be used for other purposes; ask your health care provider or pharmacist if  you have questions. COMMON BRAND NAME(S): ANX, Atarax, Rezine, Vistaril What should I tell my health care provider before I take this medicine? They need to know if you have any of these conditions: -any chronic illness -difficulty passing urine -glaucoma -heart disease -kidney disease -liver disease -lung disease -an unusual or allergic reaction to hydroxyzine, cetirizine, other medicines, foods, dyes, or preservatives -pregnant or trying to get pregnant -breast-feeding How should I use this medicine? Take this medicine by mouth with a full glass of water. Follow the directions on the prescription label. You may take this medicine with food or on an empty stomach. Take your medicine at regular intervals. Do not take your medicine more often than directed. Talk to your pediatrician regarding the use of this medicine in children. Special care may be needed. While this drug may be prescribed for children as young as 6 years of  age for selected conditions, precautions do apply. Patients over 28 years old may have a stronger reaction and need a smaller dose. Overdosage: If you think you have taken too much of this medicine contact a poison control center or emergency room at once. NOTE: This medicine is only for you. Do not share this medicine with others. What if I miss a dose? If you miss a dose, take it as soon as you can. If it is almost time for your next dose, take only that dose. Do not take double or extra doses. What may interact with this medicine? -alcohol -barbiturate medicines for sleep or seizures -medicines for colds, allergies -medicines for depression, anxiety, or emotional disturbances -medicines for pain -medicines for sleep -muscle relaxants This list may not describe all possible interactions. Give your health care provider a list of all the medicines, herbs, non-prescription drugs, or dietary supplements you use. Also tell them if you smoke, drink alcohol, or use illegal drugs. Some items may interact with your medicine. What should I watch for while using this medicine? Tell your doctor or health care professional if your symptoms do not improve. You may get drowsy or dizzy. Do not drive, use machinery, or do anything that needs mental alertness until you know how this medicine affects you. Do not stand or sit up quickly, especially if you are an older patient. This reduces the risk of dizzy or fainting spells. Alcohol may interfere with the effect of this medicine. Avoid alcoholic drinks. Your mouth may get dry. Chewing sugarless gum or sucking hard candy, and drinking plenty of water may help. Contact your doctor if the problem does not go away or is severe. This medicine may cause dry eyes and blurred vision. If you wear contact lenses you may feel some discomfort. Lubricating drops may help. See your eye doctor if the problem does not go away or is severe. If you are receiving skin tests for  allergies, tell your doctor you are using this medicine. What side effects may I notice from receiving this medicine? Side effects that you should report to your doctor or health care professional as soon as possible: -fast or irregular heartbeat -difficulty passing urine -seizures -slurred speech or confusion -tremor Side effects that usually do not require medical attention (report to your doctor or health care professional if they continue or are bothersome): -constipation -drowsiness -fatigue -headache -stomach upset This list may not describe all possible side effects. Call your doctor for medical advice about side effects. You may report side effects to FDA at 1-800-FDA-1088. Where should I keep my medicine? Keep out  of the reach of children. Store at room temperature between 15 and 30 degrees C (59 and 86 degrees F). Keep container tightly closed. Throw away any unused medicine after the expiration date. NOTE: This sheet is a summary. It may not cover all possible information. If you have questions about this medicine, talk to your doctor, pharmacist, or health care provider.  2018 Elsevier/Gold Standard (2008-03-26 14:50:59)

## 2017-11-22 ENCOUNTER — Telehealth: Payer: Self-pay | Admitting: Certified Nurse Midwife

## 2017-11-22 NOTE — Telephone Encounter (Signed)
Pt aware pharmacy has rx.

## 2017-11-22 NOTE — Telephone Encounter (Signed)
The patient called and stated that her prescription hydrOXYzine (ATARAX/VISTARIL) 25 MG tablet is not at her pharmacy. The patient would like a call back from a nurse to make sure the medication is at her pharmacy for her to pick up. Please advise.

## 2017-11-25 NOTE — Progress Notes (Signed)
[name]-Discussed orthostatic hypotension during pregnancy and home treatment measures. Reports uterine irritability. Rx: Vistaril, see orders. Anticipatory guidance regarding 28 week labs and course of prenatal care. Reviewed red flag symptoms and when to call. RTC x 3 weeks for 28 week labs and [name] with Pattricia BossAnnie or sooner if needed.

## 2017-11-26 NOTE — L&D Delivery Note (Signed)
Delivery Summary for Orland Dec  Labor Events:   Preterm labor:   Rupture date:   Rupture time:   Rupture type: Intact  Fluid Color:   Induction:   Augmentation:   Complications:   Cervical ripening:          Delivery:   Episiotomy:   Lacerations:   Repair suture:   Repair # of packets:   Blood loss (ml): 500   Information for the patient's newborn:  Terri Holt, Rice [[id number]]    Delivery 02/26/2018 12:51 PM by  C-Section, Low Vertical Sex:  female Gestational Age: [redacted]w[redacted]d Delivery Clinician:   Living?:         APGARS  One minute Five minutes Ten minutes  Skin color:        Heart rate:        Grimace:        Muscle tone:        Breathing:        Totals: 9  9      Presentation/position:      Resuscitation:   Cord information:    Disposition of cord blood:     Blood gases sent?  Complications:   Placenta: Delivered:       appearance Newborn Measurements: Weight: 7 lb 2.3 oz (3240 g)  Height: 20.71"  Head circumference:    Chest circumference:    Other providers:    Additional  information: Forceps:   Vacuum:   Breech:   Observed anomalies        See Dr. Oretha Milch C-section note for details of procedure.    Hildred Laser, MD [facility]

## 2017-12-13 ENCOUNTER — Ambulatory Visit (INDEPENDENT_AMBULATORY_CARE_PROVIDER_SITE_OTHER): Payer: Medicaid Other | Admitting: Certified Nurse Midwife

## 2017-12-13 ENCOUNTER — Other Ambulatory Visit: Payer: Medicaid Other

## 2017-12-13 VITALS — BP 123/74 | HR 104 | Wt 173.7 lb

## 2017-12-13 DIAGNOSIS — Z23 Encounter for immunization: Secondary | ICD-10-CM | POA: Diagnosis not present

## 2017-12-13 DIAGNOSIS — Z3482 Encounter for supervision of other normal pregnancy, second trimester: Secondary | ICD-10-CM

## 2017-12-13 DIAGNOSIS — Z131 Encounter for screening for diabetes mellitus: Secondary | ICD-10-CM

## 2017-12-13 DIAGNOSIS — Z130 Encounter for screening for diseases of the blood and blood-forming organs and certain disorders involving the immune mechanism: Secondary | ICD-10-CM

## 2017-12-13 DIAGNOSIS — Z3493 Encounter for supervision of normal pregnancy, unspecified, third trimester: Secondary | ICD-10-CM | POA: Diagnosis not present

## 2017-12-13 DIAGNOSIS — F419 Anxiety disorder, unspecified: Secondary | ICD-10-CM

## 2017-12-13 DIAGNOSIS — Z113 Encounter for screening for infections with a predominantly sexual mode of transmission: Secondary | ICD-10-CM

## 2017-12-13 LAB — POCT URINALYSIS DIPSTICK
Bilirubin, UA: NEGATIVE
Glucose, UA: NEGATIVE
KETONES UA: NEGATIVE
Leukocytes, UA: NEGATIVE
Nitrite, UA: NEGATIVE
PH UA: 7.5 (ref 5.0–8.0)
RBC UA: NEGATIVE
SPEC GRAV UA: 1.015 (ref 1.010–1.025)
UROBILINOGEN UA: 0.2 U/dL

## 2017-12-13 MED ORDER — TETANUS-DIPHTH-ACELL PERTUSSIS 5-2.5-18.5 LF-MCG/0.5 IM SUSP
0.5000 mL | Freq: Once | INTRAMUSCULAR | Status: AC
  Administered 2017-12-13: 0.5 mL via INTRAMUSCULAR

## 2017-12-13 MED ORDER — SERTRALINE HCL 25 MG PO TABS: 25.0000 mg | ORAL_TABLET | Freq: Every day | ORAL | 0 refills | Status: DC

## 2017-12-13 NOTE — Progress Notes (Signed)
[name]- pt is having some increased stress, glucola done, blood consent signed, tdap given, pt is having a lot of pelvic pressure

## 2017-12-13 NOTE — Patient Instructions (Addendum)
Glucose Tolerance Test During Pregnancy The glucose tolerance test (GTT) is a blood test used to determine if you have developed a type of diabetes during pregnancy (gestational diabetes). This is when your body does not properly process sugar (glucose) in the food you eat, resulting in high blood glucose levels. Typically, a GTT is done after you have had a 1-hour glucose test with results that indicate you possibly have gestational diabetes. It may also be done if:  You have a history of giving birth to very large babies or have experienced repeated fetal loss (stillbirth).  You have signs and symptoms of diabetes, such as: ? Changes in your vision. ? Tingling or numbness in your hands or feet. ? Changes in hunger, thirst, and urination not otherwise explained by your pregnancy.  The GTT lasts about 3 hours. You will be given a sugar-water solution to drink at the beginning of the test. You will have blood drawn before you drink the solution and then again 1, 2, and 3 hours after you drink it. You will not be allowed to eat or drink anything else during the test. You must remain at the testing location to make sure that your blood is drawn on time. You should also avoid exercising during the test, because exercise can alter test results. How do I prepare for this test? Eat normally for 3 days prior to the GTT test, including having plenty of carbohydrate-rich foods. Do not eat or drink anything except water during the final 12 hours before the test. In addition, your health care provider may ask you to stop taking certain medicines before the test. What do the results mean? It is your responsibility to obtain your test results. Ask the lab or department performing the test when and how you will get your results. Contact your health care provider to discuss any questions you have about your results. Range of Normal Values Ranges for normal values may vary among different labs and hospitals. You  should always check with your health care provider after having lab work or other tests done to discuss whether your values are considered within normal limits. Normal levels of blood glucose are as follows:  Fasting: less than 105 mg/dL.  1 hour after drinking the solution: less than 190 mg/dL.  2 hours after drinking the solution: less than 165 mg/dL.  3 hours after drinking the solution: less than 145 mg/dL.  Some substances can interfere with GTT results. These may include:  Blood pressure and heart failure medicines, including beta blockers, furosemide, and thiazides.  Anti-inflammatory medicines, including aspirin.  Nicotine.  Some psychiatric medicines.  Meaning of Results Outside Normal Value Ranges GTT test results that are above normal values may indicate a number of health problems, such as:  Gestational diabetes.  Acute stress response.  Cushing syndrome.  Tumors such as pheochromocytoma or glucagonoma.  Long-term kidney problems.  Pancreatitis.  Hyperthyroidism.  Current infection.  Discuss your test results with your health care provider. He or she will use the results to make a diagnosis and determine a treatment plan that is right for you. This information is not intended to replace advice given to you by your health care provider. Make sure you discuss any questions you have with your health care provider. Document Released: 05/13/2012 Document Revised: 04/19/2016 Document Reviewed: 03/19/2014 Elsevier Interactive Patient Education  2018 [employer] Inc. Cord Blood Banking Information Cord blood banking is the process of collecting and storing the blood that is in the umbilical   cord and placenta at the time of delivery. This blood contains stem cells, which can be used to treat many blood diseases, immune system disorders, and childhood cancers. Stem cells can also be used to research certain diseases and treatments. Many people who choose cord blood  banking donate the blood. Donated blood can be used in lifesaving treatments or for research. Other people choose to store the blood privately. Blood that is stored privately can only be used with the person's permission. This option is often chosen if:  A family member needs a stem cell transplant.  The child is part of an ethnic minority.  The child was conceived through in vitro fertilization.  What should I look for in a blood bank? A blood bank is the organization that coordinates cord blood banking. Make sure the cord blood bank that you use:  Is accredited.  Is financially stable.  Handles a large volume of cord blood samples.  Has a procedure in place for transport and storage.  Allows you the option of transferring your cord blood sample.  Has a procedure in place if the bank goes out of business.  Clearly states all costs and limits to future costs.  People who choose to donate cord blood should not need to pay for blood banking. People who keep the blood for private use will need to pay for the first (initial) storage and pay a fee each year (annual fee). Other fees may also apply. What are the risks of cord blood banking? There are no health risks associated with cord blood banking. It is considered safe. How should I prepare? You must schedule this process at least 4-6 weeks before you will be giving birth. How is the blood collected? The blood is collected as soon as the baby has been delivered. Within 15 minutes of delivery, a health care provider will take these actions to collect the blood:  Clamp the umbilical cord at the top and bottom. This traps the blood in the umbilical cord.  Use a syringe or bag to collect the blood.  Insert needles into the placenta to collect (draw out) more blood.  What happens after the blood is collected? After the blood has been collected:  The blood will be sent to a blood bank.  The blood will be tested for genetic problems  and infectious diseases. If the blood tests positive for a genetic problem or a disease, someone will contact you and let you know.  The blood will be frozen.  If your child develops a genetic condition, immune system disorder, or cancer, you will be responsible for contacting the blood bank and letting them know. This information is not intended to replace advice given to you by your health care provider. Make sure you discuss any questions you have with your health care provider. Document Released: 05/02/2010 Document Revised: 04/19/2016 Document Reviewed: 05/02/2015 Elsevier Interactive Patient Education  2018 Elsevier Inc.  

## 2017-12-13 NOTE — Progress Notes (Signed)
[name], doing well. She complains of anxiety and panic attacks. She is tearful stating she cant take it anymore. She denies desire to hurt herself. Discussed use of medication and counseling. She is open to both. Discussed use of zoloft in pregnancy reviewed risks and benefits. Will start @ 25 mg. Order placed for referral . 1 hr GTT, BTC/BC/RPR/ Tdap today.Follow up in 2 wk.   Doreene BurkeAnnie Milee Qualls, CNM

## 2017-12-14 LAB — RPR: RPR Ser Ql: NONREACTIVE

## 2017-12-14 LAB — CBC
Hematocrit: 35.4 % (ref 34.0–46.6)
Hemoglobin: 11.9 g/dL (ref 11.1–15.9)
MCH: 30.6 pg (ref 26.6–33.0)
MCHC: 33.6 g/dL (ref 31.5–35.7)
MCV: 91 fL (ref 79–97)
PLATELETS: 204 10*3/uL (ref 150–379)
RBC: 3.89 x10E6/uL (ref 3.77–5.28)
RDW: 12.9 % (ref 12.3–15.4)
WBC: 14.4 10*3/uL — ABNORMAL HIGH (ref 3.4–10.8)

## 2017-12-14 LAB — GLUCOSE, 1 HOUR GESTATIONAL: Gestational Diabetes Screen: 109 mg/dL (ref 65–139)

## 2017-12-27 ENCOUNTER — Encounter: Payer: Medicaid Other | Admitting: Certified Nurse Midwife

## 2017-12-30 ENCOUNTER — Ambulatory Visit (INDEPENDENT_AMBULATORY_CARE_PROVIDER_SITE_OTHER): Payer: Medicaid Other | Admitting: Certified Nurse Midwife

## 2017-12-30 ENCOUNTER — Encounter: Payer: Self-pay | Admitting: Certified Nurse Midwife

## 2017-12-30 VITALS — BP 98/63 | HR 129 | Wt 174.8 lb

## 2017-12-30 DIAGNOSIS — F32A Depression, unspecified: Secondary | ICD-10-CM

## 2017-12-30 DIAGNOSIS — Z3403 Encounter for supervision of normal first pregnancy, third trimester: Secondary | ICD-10-CM

## 2017-12-30 DIAGNOSIS — O99340 Other mental disorders complicating pregnancy, unspecified trimester: Secondary | ICD-10-CM

## 2017-12-30 DIAGNOSIS — F329 Major depressive disorder, single episode, unspecified: Secondary | ICD-10-CM

## 2017-12-30 LAB — POCT URINALYSIS DIPSTICK
BILIRUBIN UA: NEGATIVE
GLUCOSE UA: NEGATIVE
Ketones, UA: 5
LEUKOCYTES UA: NEGATIVE
Nitrite, UA: NEGATIVE
RBC UA: NEGATIVE
Spec Grav, UA: 1.015 (ref 1.010–1.025)
Urobilinogen, UA: 0.2 E.U./dL
pH, UA: 7 (ref 5.0–8.0)

## 2017-12-30 MED ORDER — SERTRALINE HCL 25 MG PO TABS: 25.0000 mg | ORAL_TABLET | Freq: Every day | ORAL | 0 refills | Status: DC

## 2017-12-30 NOTE — Progress Notes (Signed)
Pt is doing well.

## 2017-12-30 NOTE — Patient Instructions (Signed)

## 2017-12-30 NOTE — Progress Notes (Signed)
[name] , pt is tearful. States that she was not able to get medication filled, not covered by insurance. Prescription sent to [facility], it is on the $4 list. She states that she could afford 4 dollars. She denies wanting to hurt herself , her baby or her son. Order placed for referral for ocusneling. Phq 9 scale today 24. Discussed going to emergency department if desire to hurt herself or others. She verbalizes understanding. Normajean GlasgowMaryln Steel contacted to follow up with patient. Regarding medication.   Doreene BurkeAnnie Bethannie Iglehart, CNM

## 2018-01-02 ENCOUNTER — Telehealth: Payer: Self-pay | Admitting: *Deleted

## 2018-01-02 ENCOUNTER — Encounter: Payer: Self-pay | Admitting: Certified Nurse Midwife

## 2018-01-02 ENCOUNTER — Telehealth: Payer: Self-pay

## 2018-01-02 NOTE — Telephone Encounter (Signed)
Patient called and states that she experiencing a lot of pressure and " period cramps". Patient thinks they are braxton hicks but she is unsure. Patient is requesting a call back. Her contact # is [phone]. Please advise . Thank you

## 2018-01-03 NOTE — Telephone Encounter (Signed)
mychart message

## 2018-01-13 ENCOUNTER — Ambulatory Visit (INDEPENDENT_AMBULATORY_CARE_PROVIDER_SITE_OTHER): Payer: Medicaid Other | Admitting: Certified Nurse Midwife

## 2018-01-13 ENCOUNTER — Encounter: Payer: Medicaid Other | Admitting: Certified Nurse Midwife

## 2018-01-13 VITALS — BP 121/73 | HR 102 | Wt 177.4 lb

## 2018-01-13 DIAGNOSIS — Z3493 Encounter for supervision of normal pregnancy, unspecified, third trimester: Secondary | ICD-10-CM

## 2018-01-13 NOTE — Progress Notes (Signed)
[name]- pt is doing "ok", doesn't feel like her zoloft is helping yet

## 2018-01-13 NOTE — Progress Notes (Signed)
[name], pt states that she started her zoloft 2 wks ago. She does  not feel any difference at this time. Explained it may take a few more weeks before she notices a change. She verbalizes understanding. Discussed meeting with MD at next visit for [facility] counseling. She states that she is leaning towards repeat c sections. That she needs one thing that she can control . I encouraged her to let Dr. Valentino Saxonherry know her thoughts. She verbalizes understanding and agrees. Follow up 2 wk.  Doreene BurkeAnnie Reed Dady, CNM

## 2018-01-13 NOTE — Patient Instructions (Signed)

## 2018-01-28 ENCOUNTER — Ambulatory Visit (INDEPENDENT_AMBULATORY_CARE_PROVIDER_SITE_OTHER): Payer: Medicaid Other | Admitting: Obstetrics and Gynecology

## 2018-01-28 ENCOUNTER — Telehealth: Payer: Self-pay

## 2018-01-28 VITALS — BP 94/64 | HR 109 | Wt 184.6 lb

## 2018-01-28 DIAGNOSIS — Z98891 History of uterine scar from previous surgery: Secondary | ICD-10-CM

## 2018-01-28 DIAGNOSIS — F329 Major depressive disorder, single episode, unspecified: Secondary | ICD-10-CM

## 2018-01-28 DIAGNOSIS — Z3483 Encounter for supervision of other normal pregnancy, third trimester: Secondary | ICD-10-CM

## 2018-01-28 DIAGNOSIS — Z308 Encounter for other contraceptive management: Secondary | ICD-10-CM

## 2018-01-28 DIAGNOSIS — O99343 Other mental disorders complicating pregnancy, third trimester: Secondary | ICD-10-CM

## 2018-01-28 LAB — POCT URINALYSIS DIPSTICK
BILIRUBIN UA: NEGATIVE
GLUCOSE UA: NEGATIVE
Ketones, UA: NEGATIVE
Nitrite, UA: NEGATIVE
Protein, UA: NEGATIVE
RBC UA: NEGATIVE
Spec Grav, UA: 1.02 (ref 1.010–1.025)
Urobilinogen, UA: 0.2 E.U./dL
pH, UA: 6.5 (ref 5.0–8.0)

## 2018-01-28 MED ORDER — FLUOXETINE HCL 20 MG PO CAPS: 20.0000 mg | ORAL_CAPSULE | Freq: Every day | ORAL | 6 refills | Status: DC

## 2018-01-28 NOTE — Telephone Encounter (Addendum)
Cm made tc with member today.  Member reports no current issues with her Medicaid.  CM discussed with member that Dr. Valentino Saxonherry discussed her visit with her today.  CM asked member if she knew that her depression medication was being switched from Zoloft to Prozac.  Member reports that she understood that she and Provider spoke about Prozac, but didn't know that she was being prescribed to Prozac.  CM confirmed with member that this cm discussed with OB Provider that she was being switched to Prozac and encouraged member to pick up her prescription from her pharmacy.  Member agreeable.  CM offered counseling services through Kathreen CosierAmanda Marvin, LCSW  Regardless if member had insurance or not.  Member discussed that she desired counseling services, however, despite having a "very supportive support group", member reports that she will not be able to fit a counseling appointment in her schedule due to her current work schedule.  Member also discussed with this cm, "even after I deliver, I will have to go back to work days after I deliver."  Member reports that she works full time on top of a part time job.  CM discussed with member that when she chooses and when she is ready to start counseling services she may contact myself or notify her OB provider.  Member agreeable.

## 2018-01-28 NOTE — Progress Notes (Signed)
[name]: Patient presents from midwifery care for discussion of TOLAC vs repeat C-section. Initially desired TOLAC, however now states she wants repeat C-section. Counseled regarding TOLAC vs RCS; risks/benefits discussed in detail. All questions answered.  Patient elects for repeat C-section.  Will schedule for April 3rd.  To f/u with MDs at 38 weeks for pre-op. She now states that she would also desire to have a BTL at time of her C-section. Discussed all options for birth control, including LARC, condoms, Nuvaring, contraceptive patch, OCPs, Depo provera. Medicaid BTL form completed today.    Of note, patient appears visibly sad and depressed today with flat affect. She was prescribed Zoloft last month, stopped taking Zoloft due to severe headaches 1.5 weeks ago (notes this has also happened in the past with use of Zoloft). Has also used wellbutrin in the past in conjunction with Zoloft. Reports infidelity of husband found out during the pregnancy.  Just had to sell house for financial reasons, staying with other family. Notes good family support. Will change from Zoloft to Prozac. Discussed risks of use during pregnancy.  Will also have patient referred for counseling. To f/u in 2 weeks.  Also noted that patient has not had growth in last visit (FH).  If no further growth by next visit, can consider growth scan. Discussed management of right sided pain.

## 2018-01-28 NOTE — Patient Instructions (Addendum)
Fluoxetine capsules or tablets (Depression/Mood Disorders) What is this medicine? FLUOXETINE (floo OX e teen) belongs to a class of drugs known as selective serotonin reuptake inhibitors (SSRIs). It helps to treat mood problems such as depression, obsessive compulsive disorder, and panic attacks. It can also treat certain eating disorders. This medicine may be used for other purposes; ask your health care provider or pharmacist if you have questions. COMMON BRAND NAME(S): Prozac What should I tell my health care provider before I take this medicine? They need to know if you have any of these conditions: -bipolar disorder or a family history of bipolar disorder -bleeding disorders -glaucoma -heart disease -liver disease -low levels of sodium in the blood -seizures -suicidal thoughts, plans, or attempt; a previous suicide attempt by you or a family member -take MAOIs like Carbex, Eldepryl, Marplan, Nardil, and Parnate -take medicines that treat or prevent blood clots -thyroid disease -an unusual or allergic reaction to fluoxetine, other medicines, foods, dyes, or preservatives -pregnant or trying to get pregnant -breast-feeding How should I use this medicine? Take this medicine by mouth with a glass of water. Follow the directions on the prescription label. You can take this medicine with or without food. Take your medicine at regular intervals. Do not take it more often than directed. Do not stop taking this medicine suddenly except upon the advice of your doctor. Stopping this medicine too quickly may cause serious side effects or your condition may worsen. A special MedGuide will be given to you by the pharmacist with each prescription and refill. Be sure to read this information carefully each time. Talk to your pediatrician regarding the use of this medicine in children. While this drug may be prescribed for children as young as 7 years for selected conditions, precautions do  apply. Overdosage: If you think you have taken too much of this medicine contact a poison control center or emergency room at once. NOTE: This medicine is only for you. Do not share this medicine with others. What if I miss a dose? If you miss a dose, skip the missed dose and go back to your regular dosing schedule. Do not take double or extra doses. What may interact with this medicine? Do not take this medicine with any of the following medications: -other medicines containing fluoxetine, like Sarafem or Symbyax -cisapride -linezolid -MAOIs like Carbex, Eldepryl, Marplan, Nardil, and Parnate -methylene blue (injected into a vein) -pimozide -thioridazine This medicine may also interact with the following medications: -alcohol -amphetamines -aspirin and aspirin-like medicines -carbamazepine -certain medicines for depression, anxiety, or psychotic disturbances -certain medicines for migraine headaches like almotriptan, eletriptan, frovatriptan, naratriptan, rizatriptan, sumatriptan, zolmitriptan -digoxin -diuretics -fentanyl -flecainide -furazolidone -isoniazid -lithium -medicines for sleep -medicines that treat or prevent blood clots like warfarin, enoxaparin, and dalteparin -NSAIDs, medicines for pain and inflammation, like ibuprofen or naproxen -phenytoin -procarbazine -propafenone -rasagiline -ritonavir -supplements like St. John's wort, kava kava, valerian -tramadol -tryptophan -vinblastine This list may not describe all possible interactions. Give your health care provider a list of all the medicines, herbs, non-prescription drugs, or dietary supplements you use. Also tell them if you smoke, drink alcohol, or use illegal drugs. Some items may interact with your medicine. What should I watch for while using this medicine? Tell your doctor if your symptoms do not get better or if they get worse. Visit your doctor or health care professional for regular checks on your  progress. Because it may take several weeks to see the full effects of this medicine, it   is important to continue your treatment as prescribed by your doctor. Patients and their families should watch out for new or worsening thoughts of suicide or depression. Also watch out for sudden changes in feelings such as feeling anxious, agitated, panicky, irritable, hostile, aggressive, impulsive, severely restless, overly excited and hyperactive, or not being able to sleep. If this happens, especially at the beginning of treatment or after a change in dose, call your health care professional. Bonita QuinYou may get drowsy or dizzy. Do not drive, use machinery, or do anything that needs mental alertness until you know how this medicine affects you. Do not stand or sit up quickly, especially if you are an older patient. This reduces the risk of dizzy or fainting spells. Alcohol may interfere with the effect of this medicine. Avoid alcoholic drinks. Your mouth may get dry. Chewing sugarless gum or sucking hard candy, and drinking plenty of water may help. Contact your doctor if the problem does not go away or is severe. This medicine may affect blood sugar levels. If you have diabetes, check with your doctor or health care professional before you change your diet or the dose of your diabetic medicine. What side effects may I notice from receiving this medicine? Side effects that you should report to your doctor or health care professional as soon as possible: -allergic reactions like skin rash, itching or hives, swelling of the face, lips, or tongue -anxious -black, tarry stools -breathing problems -changes in vision -confusion -elevated mood, decreased need for sleep, racing thoughts, impulsive behavior -eye pain -fast, irregular heartbeat -feeling faint or lightheaded, falls -feeling agitated, angry, or irritable -hallucination, loss of contact with reality -loss of balance or coordination -loss of memory -painful  or prolonged erections -restlessness, pacing, inability to keep still -seizures -stiff muscles -suicidal thoughts or other mood changes -trouble sleeping -unusual bleeding or bruising -unusually weak or tired -vomiting Side effects that usually do not require medical attention (report to your doctor or health care professional if they continue or are bothersome): -change in appetite or weight -change in sex drive or performance -diarrhea -dry mouth -headache -increased sweating -nausea -tremors This list may not describe all possible side effects. Call your doctor for medical advice about side effects. You may report side effects to FDA at 1-800-FDA-1088. Where should I keep my medicine? Keep out of the reach of children. Store at room temperature between 15 and 30 degrees C (59 and 86 degrees F). Throw away any unused medicine after the expiration date. NOTE: This sheet is a summary. It may not cover all possible information. If you have questions about this medicine, talk to your doctor, pharmacist, or health care provider.  2018 Elsevier/Gold Standard (2016-04-14 15:55:27)  Cesarean Delivery Cesarean birth, or cesarean delivery, is the surgical delivery of a baby through an incision in the abdomen and the uterus. This may be referred to as a C-section. This procedure may be scheduled ahead of time, or it may be done in an emergency situation. Tell a health care provider about:  Any allergies you have.  All medicines you are taking, including vitamins, herbs, eye drops, creams, and over-the-counter medicines.  Any problems you or family members have had with anesthetic medicines.  Any blood disorders you have.  Any surgeries you have had.  Any medical conditions you have.  Whether you or any members of your family have a history of deep vein thrombosis (DVT) or pulmonary embolism (PE). What are the risks? Generally, this is a safe procedure.  However, problems may occur,  including:  Infection.  Bleeding.  Allergic reactions to medicines.  Damage to other structures or organs.  Blood clots.  Injury to your baby.  What happens before the procedure?  Follow instructions from your health care provider about eating or drinking restrictions.  Follow instructions from your health care provider about bathing before your procedure to help reduce your risk of infection.  If you know that you are going to have a cesarean delivery, do not shave your pubic area. Shaving before the procedure may increase your risk of infection.  Ask your health care provider about: ? Changing or stopping your regular medicines. This is especially important if you are taking diabetes medicines or blood thinners. ? Your pain management plan. This is especially important if you plan to breastfeed your baby. ? How long you will be in the hospital after the procedure. ? Any concerns you may have about receiving blood products if you need them during the procedure. ? Cord blood banking, if you plan to collect your baby's umbilical cord blood.  You may also want to ask your health care provider: ? Whether you will be able to hold or breastfeed your baby while you are still in the operating room. ? Whether your baby can stay with you immediately after the procedure and during your recovery. ? Whether a family member or a person of your choice can go with you into the operating room and stay with you during the procedure, immediately after the procedure, and during your recovery.  Plan to have someone drive you home when you are discharged from the hospital. What happens during the procedure?  Fetal monitors will be placed on your abdomen to monitor your heart rate and your baby's heart rate.  Depending on the reason for your cesarean delivery, you may have a physical exam or additional testing, such as an ultrasound.  An IV tube will be inserted into one of your veins.  You may  have your blood or urine tested.  You will be given antibiotic medicine to help prevent infection.  You may be given a special warming gown to wear to keep your temperature stable.  Hair may be removed from your pubic area.  The skin of your pubic area and lower abdomen will be cleaned with a germ-killing solution (antiseptic).  A catheter may be inserted into your bladder through your urethra. This drains your urine during the procedure.  You may be given one or more of the following: ? A medicine to numb the area (local anesthetic). ? A medicine to make you fall asleep (general anesthetic). ? A medicine (regional anesthetic) that is injected into your back or through a small thin tube placed in your back (spinal anesthetic or epidural anesthetic). This numbs everything below the injection site and allows you to stay awake during your procedure. If this makes you feel nauseous, tell your health care provider. Medicines will be available to help reduce any nausea you may feel.  An incision will be made in your abdomen, and then in your uterus.  If you are awake during your procedure, you may feel tugging and pulling in your abdomen, but you should not feel pain. If you feel pain, tell your health care provider immediately.  Your baby will be removed from your uterus. You may feel more pressure or pushing while this happens.  Immediately after birth, your baby will be dried and kept warm. You may be able to hold  and breastfeed your baby. The umbilical cord may be clamped and cut during this time.  Your placenta will be removed from your uterus.  Your incisions will be closed with stitches (sutures). Staples, skin glue, or adhesive strips may also be applied to the incision in your abdomen.  Bandages (dressings) will be placed over the incision in your abdomen. The procedure may vary among health care providers and hospitals. What happens after the procedure?  Your blood pressure,  heart rate, breathing rate, and blood oxygen level will be monitored often until the medicines you were given have worn off.  You may continue to receive fluids and medicines through an IV tube.  You will have some pain. Medicines will be available to help control your pain.  To help prevent blood clots: ? You may be given medicines. ? You may have to wear compression stockings or devices. ? You will be encouraged to walk around when you are able.  Hospital staff will encourage and support bonding with your baby. Your hospital may allow you and your baby to stay in the same room (rooming in) during your hospital stay to encourage successful breastfeeding.  You may be encouraged to cough and breathe deeply often. This helps to prevent lung problems.  If you have a catheter draining your urine, it will be removed as soon as possible after your procedure. This information is not intended to replace advice given to you by your health care provider. Make sure you discuss any questions you have with your health care provider. Document Released: 11/12/2005 Document Revised: 04/19/2016 Document Reviewed: 08/23/2015 Elsevier Interactive Patient Education  [employer].

## 2018-01-28 NOTE — Progress Notes (Signed)
[name] Pt currently have had sharp pains in her right side.

## 2018-02-11 ENCOUNTER — Encounter: Payer: Self-pay | Admitting: Certified Nurse Midwife

## 2018-02-11 ENCOUNTER — Ambulatory Visit (INDEPENDENT_AMBULATORY_CARE_PROVIDER_SITE_OTHER): Payer: Medicaid Other | Admitting: Certified Nurse Midwife

## 2018-02-11 VITALS — BP 114/70 | HR 82 | Wt 182.4 lb

## 2018-02-11 DIAGNOSIS — Z3483 Encounter for supervision of other normal pregnancy, third trimester: Secondary | ICD-10-CM | POA: Diagnosis not present

## 2018-02-11 LAB — POCT URINALYSIS DIPSTICK
Bilirubin, UA: NEGATIVE
Glucose, UA: NEGATIVE
KETONES UA: NEGATIVE
Leukocytes, UA: NEGATIVE
NITRITE UA: NEGATIVE
PH UA: 5 (ref 5.0–8.0)
PROTEIN UA: NEGATIVE
RBC UA: NEGATIVE
Spec Grav, UA: 1.025 (ref 1.010–1.025)
UROBILINOGEN UA: 0.2 U/dL

## 2018-02-11 NOTE — Progress Notes (Signed)
Pt is here for an [name] visit. Also needs cultures done.

## 2018-02-11 NOTE — Progress Notes (Signed)
[name], doing well. Pt states that she got nervous about taking the Prozac after talking to Dr. Valentino Saxonherry about the risks so she has not picked it up from pharmacy. She states she is feeling better. She has been going outside more and has been doing some side work. Discussed counseling. She states that when I had put order in someone called her but it was late when she got the call so she never heard from them again. She has not heard anything since Dr.Cherry has put order in . Encouraged her to consider counseling. Talked about exercise to improved mood. She states that she joined a gym. She feels good fetal movement. GBS and cultures today. Next visit with Dr. Valentino Saxonherry for pre operative appointment in one week.   Doreene BurkeAnnie Kaylany Tesoriero, CNM

## 2018-02-11 NOTE — Patient Instructions (Signed)
Braxton Hicks Contractions °Contractions of the uterus can occur throughout pregnancy, but they are not always a sign that you are in labor. You may have practice contractions called Braxton Hicks contractions. These false labor contractions are sometimes confused with true labor. °What are Braxton Hicks contractions? °Braxton Hicks contractions are tightening movements that occur in the muscles of the uterus before labor. Unlike true labor contractions, these contractions do not result in opening (dilation) and thinning of the cervix. Toward the end of pregnancy (32-34 weeks), Braxton Hicks contractions can happen more often and may become stronger. These contractions are sometimes difficult to tell apart from true labor because they can be very uncomfortable. You should not feel embarrassed if you go to the hospital with false labor. °Sometimes, the only way to tell if you are in true labor is for your health care provider to look for changes in the cervix. The health care provider will do a physical exam and may monitor your contractions. If you are not in true labor, the exam should show that your cervix is not dilating and your water has not broken. °If there are other health problems associated with your pregnancy, it is completely safe for you to be sent home with false labor. You may continue to have Braxton Hicks contractions until you go into true labor. °How to tell the difference between true labor and false labor °True labor °· Contractions last 30-70 seconds. °· Contractions become very regular. °· Discomfort is usually felt in the top of the uterus, and it spreads to the lower abdomen and low back. °· Contractions do not go away with walking. °· Contractions usually become more intense and increase in frequency. °· The cervix dilates and gets thinner. °False labor °· Contractions are usually shorter and not as strong as true labor contractions. °· Contractions are usually irregular. °· Contractions  are often felt in the front of the lower abdomen and in the groin. °· Contractions may go away when you walk around or change positions while lying down. °· Contractions get weaker and are shorter-lasting as time goes on. °· The cervix usually does not dilate or become thin. °Follow these instructions at home: °· Take over-the-counter and prescription medicines only as told by your health care provider. °· Keep up with your usual exercises and follow other instructions from your health care provider. °· Eat and drink lightly if you think you are going into labor. °· If Braxton Hicks contractions are making you uncomfortable: °? Change your position from lying down or resting to walking, or change from walking to resting. °? Sit and rest in a tub of warm water. °? Drink enough fluid to keep your urine pale yellow. Dehydration may cause these contractions. °? Do slow and deep breathing several times an hour. °· Keep all follow-up prenatal visits as told by your health care provider. This is important. °Contact a health care provider if: °· You have a fever. °· You have continuous pain in your abdomen. °Get help right away if: °· Your contractions become stronger, more regular, and closer together. °· You have fluid leaking or gushing from your vagina. °· You pass blood-tinged mucus (bloody show). °· You have bleeding from your vagina. °· You have low back pain that you never had before. °· You feel your baby’s head pushing down and causing pelvic pressure. °· Your baby is not moving inside you as much as it used to. °Summary °· Contractions that occur before labor are called Braxton   Hicks contractions, false labor, or practice contractions. °· Braxton Hicks contractions are usually shorter, weaker, farther apart, and less regular than true labor contractions. True labor contractions usually become progressively stronger and regular and they become more frequent. °· Manage discomfort from Braxton Hicks contractions by  changing position, resting in a warm bath, drinking plenty of water, or practicing deep breathing. °This information is not intended to replace advice given to you by your health care provider. Make sure you discuss any questions you have with your health care provider. °Document Released: 03/28/2017 Document Revised: 03/28/2017 Document Reviewed: 03/28/2017 °Elsevier Interactive Patient Education © 2018 Elsevier Inc. ° °

## 2018-02-13 LAB — GC/CHLAMYDIA PROBE AMP
CHLAMYDIA, DNA PROBE: NEGATIVE
NEISSERIA GONORRHOEAE BY PCR: NEGATIVE

## 2018-02-15 LAB — STREP GP B CULTURE+RFLX: Strep Gp B Culture+Rflx: NEGATIVE

## 2018-02-16 ENCOUNTER — Encounter: Payer: Self-pay | Admitting: Certified Nurse Midwife

## 2018-02-17 ENCOUNTER — Ambulatory Visit (INDEPENDENT_AMBULATORY_CARE_PROVIDER_SITE_OTHER): Payer: Medicaid Other | Admitting: Obstetrics and Gynecology

## 2018-02-17 VITALS — BP 102/68 | HR 74 | Wt 183.4 lb

## 2018-02-17 DIAGNOSIS — F329 Major depressive disorder, single episode, unspecified: Secondary | ICD-10-CM

## 2018-02-17 DIAGNOSIS — Z3483 Encounter for supervision of other normal pregnancy, third trimester: Secondary | ICD-10-CM

## 2018-02-17 DIAGNOSIS — O34219 Maternal care for unspecified type scar from previous cesarean delivery: Secondary | ICD-10-CM

## 2018-02-17 DIAGNOSIS — O99343 Other mental disorders complicating pregnancy, third trimester: Secondary | ICD-10-CM

## 2018-02-17 LAB — POCT URINALYSIS DIPSTICK
BILIRUBIN UA: NEGATIVE
GLUCOSE UA: NEGATIVE
Ketones, UA: NEGATIVE
Nitrite, UA: NEGATIVE
Protein, UA: NEGATIVE
RBC UA: NEGATIVE
SPEC GRAV UA: 1.02 (ref 1.010–1.025)
Urobilinogen, UA: 0.2 E.U./dL
pH, UA: 6 (ref 5.0–8.0)

## 2018-02-17 NOTE — Progress Notes (Signed)
Terri Holt is doing well a lot better than last visit. Pt is not taking Prozac due to side effects and she has been getting out and doing more things and feeling a connect to the baby(pregnancy) now than she did before. EDS=7

## 2018-02-17 NOTE — Progress Notes (Signed)
[name]: Overall doing well. Notes that she did not take Prozac but has been doing much better.  Notes she has been getting out more and started a part time job which allows her to be out in nature more which she enjoys. EDPS = 7.  Reviewed C-section with BTL procedure and risks.  All questions answered. Scheduled for 02/26/2018.

## 2018-02-25 ENCOUNTER — Inpatient Hospital Stay: Admission: RE | Admit: 2018-02-25 | Payer: Self-pay | Source: Ambulatory Visit

## 2018-02-25 ENCOUNTER — Other Ambulatory Visit: Payer: Self-pay

## 2018-02-25 ENCOUNTER — Encounter
Admission: RE | Admit: 2018-02-25 | Discharge: 2018-02-25 | Disposition: A | Discharge status: Home / Self Care | Payer: Medicaid Other | Source: Ambulatory Visit | Attending: Obstetrics and Gynecology | Admitting: Obstetrics and Gynecology

## 2018-02-25 LAB — TYPE AND SCREEN
ABO/RH(D): O POS
ANTIBODY SCREEN: NEGATIVE
EXTEND SAMPLE REASON: UNDETERMINED

## 2018-02-25 LAB — CBC
HEMATOCRIT: 37.6 % (ref 35.0–47.0)
HEMOGLOBIN: 12.9 g/dL (ref 12.0–16.0)
MCH: 30.5 pg (ref 26.0–34.0)
MCHC: 34.3 g/dL (ref 32.0–36.0)
MCV: 89 fL (ref 80.0–100.0)
PLATELETS: 249 10*3/uL (ref 150–440)
RBC: 4.22 MIL/uL (ref 3.80–5.20)
RDW: 13.1 % (ref 11.5–14.5)
WBC: 13.1 10*3/uL — AB (ref 3.6–11.0)

## 2018-02-25 LAB — RAPID HIV SCREEN (HIV 1/2 AB+AG)
HIV 1/2 Antibodies: NONREACTIVE
HIV-1 P24 Antigen - HIV24: NONREACTIVE

## 2018-02-25 NOTE — Patient Instructions (Signed)
Your procedure is scheduled on: 02/26/18 Report to THE BIRTHPLACE LOCATED ON 3RD FLOOR VISITOR'S ENTRANCE AT 10:15 AM. .  Remember: Instructions that are not followed completely may result in serious medical risk, up to and including death, or upon the discretion of your surgeon and anesthesiologist your surgery may need to be rescheduled.     _X__ 1. Do not eat food after midnight the night before your procedure.                 No gum chewing or hard candies. You may drink clear liquids up to 2 hours                 before you are scheduled to arrive for your surgery- DO not drink clear                 liquids within 2 hours of the start of your surgery.                 Clear Liquids include:  water, apple juice without pulp, clear carbohydrate                 drink such as Clearfast or Gatorade, Black Coffee or Tea (Do not add                 anything to coffee or tea).  __X__2.  On the morning of surgery brush your teeth with toothpaste and water, you                 may rinse your mouth with mouthwash if you wish.  Do not swallow any              toothpaste of mouthwash.     _X__ 3.  No Alcohol for 24 hours before or after surgery.   _X__ 4.  Do Not Smoke or use e-cigarettes For 24 Hours Prior to Your Surgery.                 Do not use any chewable tobacco products for at least 6 hours prior to                 surgery.  ____  5.  Bring all medications with you on the day of surgery if instructed.   __X__  6.  Notify your doctor if there is any change in your medical condition      (cold, fever, infections).     Do not wear jewelry, make-up, hairpins, clips or nail polish. Do not wear lotions, powders, or perfumes.  Do not shave 48 hours prior to surgery. Men may shave face and neck. Do not bring valuables to the hospital.    [facility] is not responsible for any belongings or valuables.  Contacts, dentures/partials or body piercings may not be worn into surgery. Bring a case  for your contacts, glasses or hearing aids, a denture cup will be supplied. Leave your suitcase in the car. After surgery it may be brought to your room. For patients admitted to the hospital, discharge time is determined by your treatment team.   Patients discharged the day of surgery will not be allowed to drive home.   Please read over the following fact sheets that you were given:   MRSA Information  __X__ Take these medicines the morning of surgery with A SIP OF WATER:    1. NONE  2.   3.   4.  5.  6.  ____  Fleet Enema (as directed)   __X__ Use CHG Soap/SAGE wipes as directed AVOID  USING SOAP ON BREASTS  ____ Use inhalers on the day of surgery  ____ Stop metformin/Janumet/Farxiga 2 days prior to surgery    ____ Take 1/2 of usual insulin dose the night before surgery. No insulin the morning          of surgery.   ____ Stop Blood Thinners Coumadin/Plavix/Xarelto/Pleta/Pradaxa/Eliquis/Effient/Aspirin  on   Or contact your Surgeon, Cardiologist or Medical Doctor regarding  ability to stop your blood thinners  __ __ Stop Anti-inflammatories 7 days before surgery such as Advil, Ibuprofen, Motrin,  BC or Goodies Powder, Naprosyn, Naproxen, Aleve, Aspirin    __ __ Stopall herbal supplements, fish oil or vitamin E until after surgery.    ____ Bring C-Pap to the hospital.

## 2018-02-26 ENCOUNTER — Encounter
Admission: RE | Disposition: A | Discharge status: Home / Self Care | Payer: Self-pay | Source: Ambulatory Visit | Attending: Certified Nurse Midwife

## 2018-02-26 ENCOUNTER — Other Ambulatory Visit: Payer: Self-pay

## 2018-02-26 ENCOUNTER — Inpatient Hospital Stay: Payer: Medicaid Other | Admitting: Anesthesiology

## 2018-02-26 ENCOUNTER — Encounter: Payer: Self-pay | Admitting: *Deleted

## 2018-02-26 ENCOUNTER — Inpatient Hospital Stay
Admission: RE | Admit: 2018-02-26 | Discharge: 2018-02-28 | DRG: 785 | Disposition: A | Discharge status: Home / Self Care | Payer: Medicaid Other | Source: Ambulatory Visit | Attending: Certified Nurse Midwife | Admitting: Certified Nurse Midwife

## 2018-02-26 DIAGNOSIS — F419 Anxiety disorder, unspecified: Secondary | ICD-10-CM | POA: Diagnosis present

## 2018-02-26 DIAGNOSIS — Z98891 History of uterine scar from previous surgery: Secondary | ICD-10-CM

## 2018-02-26 DIAGNOSIS — Z8673 Personal history of transient ischemic attack (TIA), and cerebral infarction without residual deficits: Secondary | ICD-10-CM

## 2018-02-26 DIAGNOSIS — Z3A39 39 weeks gestation of pregnancy: Secondary | ICD-10-CM

## 2018-02-26 DIAGNOSIS — O99344 Other mental disorders complicating childbirth: Secondary | ICD-10-CM | POA: Diagnosis present

## 2018-02-26 DIAGNOSIS — Z87891 Personal history of nicotine dependence: Secondary | ICD-10-CM | POA: Diagnosis not present

## 2018-02-26 DIAGNOSIS — F329 Major depressive disorder, single episode, unspecified: Secondary | ICD-10-CM | POA: Diagnosis present

## 2018-02-26 DIAGNOSIS — Z880 Allergy status to penicillin: Secondary | ICD-10-CM

## 2018-02-26 DIAGNOSIS — O34211 Maternal care for low transverse scar from previous cesarean delivery: Principal | ICD-10-CM | POA: Diagnosis present

## 2018-02-26 DIAGNOSIS — Z302 Encounter for sterilization: Secondary | ICD-10-CM

## 2018-02-26 LAB — CREATININE, SERUM: Creatinine, Ser: 0.6 mg/dL (ref 0.44–1.00)

## 2018-02-26 LAB — ABO/RH: ABO/RH(D): O POS

## 2018-02-26 LAB — RPR: RPR Ser Ql: NONREACTIVE

## 2018-02-26 SURGERY — Surgical Case
Anesthesia: Spinal

## 2018-02-26 MED ORDER — SIMETHICONE 80 MG PO CHEW
80.0000 mg | CHEWABLE_TABLET | ORAL | Status: DC
  Administered 2018-02-27 – 2018-02-28 (×2): 80 mg via ORAL
  Filled 2018-02-26 (×2): qty 1

## 2018-02-26 MED ORDER — NALBUPHINE HCL 10 MG/ML IJ SOLN: 5.0000 mg | Freq: Once | INTRAMUSCULAR | Status: DC | PRN

## 2018-02-26 MED ORDER — OXYTOCIN 40 UNITS IN LACTATED RINGERS INFUSION - SIMPLE MED
INTRAVENOUS | Status: AC
  Filled 2018-02-26: qty 1000

## 2018-02-26 MED ORDER — ZOLPIDEM TARTRATE 5 MG PO TABS: 5.0000 mg | ORAL_TABLET | Freq: Every evening | ORAL | Status: DC | PRN

## 2018-02-26 MED ORDER — LACTATED RINGERS IV SOLN: INTRAVENOUS | Status: DC

## 2018-02-26 MED ORDER — ONDANSETRON HCL 4 MG/2ML IJ SOLN
INTRAMUSCULAR | Status: AC
  Filled 2018-02-26: qty 2

## 2018-02-26 MED ORDER — EPHEDRINE SULFATE 50 MG/ML IJ SOLN
INTRAMUSCULAR | Status: AC
  Filled 2018-02-26: qty 1

## 2018-02-26 MED ORDER — LIDOCAINE 5 % EX PTCH
1.0000 | MEDICATED_PATCH | CUTANEOUS | Status: DC
  Administered 2018-02-27 – 2018-02-28 (×2): 1 via TRANSDERMAL
  Filled 2018-02-26 (×2): qty 1

## 2018-02-26 MED ORDER — KETOROLAC TROMETHAMINE 30 MG/ML IJ SOLN: 30.0000 mg | Freq: Four times a day (QID) | INTRAMUSCULAR | Status: DC

## 2018-02-26 MED ORDER — NALBUPHINE HCL 10 MG/ML IJ SOLN: 5.0000 mg | INTRAMUSCULAR | Status: DC | PRN

## 2018-02-26 MED ORDER — COCONUT OIL OIL: 1.0000 "application " | TOPICAL_OIL | Status: DC | PRN

## 2018-02-26 MED ORDER — SIMETHICONE 80 MG PO CHEW
80.0000 mg | CHEWABLE_TABLET | ORAL | Status: DC | PRN
  Administered 2018-02-28: 80 mg via ORAL
  Filled 2018-02-26: qty 1

## 2018-02-26 MED ORDER — SOD CITRATE-CITRIC ACID 500-334 MG/5ML PO SOLN
30.0000 mL | ORAL | Status: AC
  Administered 2018-02-26: 30 mL via ORAL
  Filled 2018-02-26: qty 15

## 2018-02-26 MED ORDER — LACTATED RINGERS IV SOLN
INTRAVENOUS | Status: DC
  Administered 2018-02-26 (×2): 1000 mL via INTRAVENOUS

## 2018-02-26 MED ORDER — OXYCODONE-ACETAMINOPHEN 5-325 MG PO TABS
1.0000 | ORAL_TABLET | ORAL | Status: DC | PRN
  Administered 2018-02-27 – 2018-02-28 (×5): 1 via ORAL
  Filled 2018-02-26 (×5): qty 1

## 2018-02-26 MED ORDER — BUPIVACAINE IN DEXTROSE 0.75-8.25 % IT SOLN
INTRATHECAL | Status: DC | PRN
  Administered 2018-02-26: 1.8 mL via INTRATHECAL

## 2018-02-26 MED ORDER — SODIUM CHLORIDE 0.9% FLUSH: 3.0000 mL | INTRAVENOUS | Status: DC | PRN

## 2018-02-26 MED ORDER — DIBUCAINE 1 % RE OINT: 1.0000 "application " | TOPICAL_OINTMENT | RECTAL | Status: DC | PRN

## 2018-02-26 MED ORDER — KETOROLAC TROMETHAMINE 30 MG/ML IJ SOLN
INTRAMUSCULAR | Status: DC | PRN
  Administered 2018-02-26: 30 mg via INTRAVENOUS

## 2018-02-26 MED ORDER — ACETAMINOPHEN 325 MG PO TABS
650.0000 mg | ORAL_TABLET | ORAL | Status: DC | PRN
  Filled 2018-02-26: qty 2

## 2018-02-26 MED ORDER — MORPHINE SULFATE (PF) 0.5 MG/ML IJ SOLN
INTRAMUSCULAR | Status: AC
  Filled 2018-02-26: qty 10

## 2018-02-26 MED ORDER — LIDOCAINE 5 % EX PTCH
1.0000 | MEDICATED_PATCH | CUTANEOUS | Status: DC
  Filled 2018-02-26: qty 1

## 2018-02-26 MED ORDER — MENTHOL 3 MG MT LOZG
1.0000 | LOZENGE | OROMUCOSAL | Status: DC | PRN
  Filled 2018-02-26: qty 9

## 2018-02-26 MED ORDER — PRENATAL MULTIVITAMIN CH
1.0000 | ORAL_TABLET | Freq: Every day | ORAL | Status: DC
  Administered 2018-02-27 – 2018-02-28 (×2): 1 via ORAL
  Filled 2018-02-26 (×2): qty 1

## 2018-02-26 MED ORDER — OXYCODONE-ACETAMINOPHEN 5-325 MG PO TABS
2.0000 | ORAL_TABLET | ORAL | Status: DC | PRN
  Administered 2018-02-28: 2 via ORAL
  Filled 2018-02-26: qty 2

## 2018-02-26 MED ORDER — ACETAMINOPHEN 325 MG PO TABS: 650.0000 mg | ORAL_TABLET | Freq: Four times a day (QID) | ORAL | Status: DC

## 2018-02-26 MED ORDER — SENNOSIDES-DOCUSATE SODIUM 8.6-50 MG PO TABS
2.0000 | ORAL_TABLET | ORAL | Status: DC
  Administered 2018-02-27 – 2018-02-28 (×2): 2 via ORAL
  Filled 2018-02-26 (×2): qty 2

## 2018-02-26 MED ORDER — FERROUS SULFATE 325 (65 FE) MG PO TABS
325.0000 mg | ORAL_TABLET | Freq: Two times a day (BID) | ORAL | Status: DC
  Administered 2018-02-26 – 2018-02-28 (×5): 325 mg via ORAL
  Filled 2018-02-26 (×5): qty 1

## 2018-02-26 MED ORDER — LACTATED RINGERS IV SOLN: Freq: Once | INTRAVENOUS | Status: DC

## 2018-02-26 MED ORDER — ONDANSETRON HCL 4 MG/2ML IJ SOLN: 4.0000 mg | Freq: Three times a day (TID) | INTRAMUSCULAR | Status: DC | PRN

## 2018-02-26 MED ORDER — NALOXONE HCL 0.4 MG/ML IJ SOLN: 0.4000 mg | INTRAMUSCULAR | Status: DC | PRN

## 2018-02-26 MED ORDER — DIPHENHYDRAMINE HCL 25 MG PO CAPS: 25.0000 mg | ORAL_CAPSULE | Freq: Four times a day (QID) | ORAL | Status: DC | PRN

## 2018-02-26 MED ORDER — OXYCODONE HCL 5 MG PO TABS
5.0000 mg | ORAL_TABLET | ORAL | Status: DC | PRN
  Administered 2018-02-26 – 2018-02-27 (×3): 5 mg via ORAL
  Filled 2018-02-26 (×3): qty 1

## 2018-02-26 MED ORDER — OXYTOCIN 40 UNITS IN LACTATED RINGERS INFUSION - SIMPLE MED
INTRAVENOUS | Status: DC | PRN
  Administered 2018-02-26: 500 mL via INTRAVENOUS

## 2018-02-26 MED ORDER — DIPHENHYDRAMINE HCL 25 MG PO CAPS: 25.0000 mg | ORAL_CAPSULE | ORAL | Status: DC | PRN

## 2018-02-26 MED ORDER — PHENYLEPHRINE HCL 10 MG/ML IJ SOLN
INTRAMUSCULAR | Status: DC | PRN
  Administered 2018-02-26: 50 ug/min via INTRAVENOUS

## 2018-02-26 MED ORDER — WITCH HAZEL-GLYCERIN EX PADS: 1.0000 "application " | MEDICATED_PAD | CUTANEOUS | Status: DC | PRN

## 2018-02-26 MED ORDER — MEPERIDINE HCL 25 MG/ML IJ SOLN: 6.2500 mg | INTRAMUSCULAR | Status: DC | PRN

## 2018-02-26 MED ORDER — IBUPROFEN 800 MG PO TABS
800.0000 mg | ORAL_TABLET | Freq: Four times a day (QID) | ORAL | Status: DC
  Administered 2018-02-26 – 2018-02-28 (×8): 800 mg via ORAL
  Filled 2018-02-26 (×8): qty 1

## 2018-02-26 MED ORDER — OXYTOCIN 40 UNITS IN LACTATED RINGERS INFUSION - SIMPLE MED
2.5000 [IU]/h | INTRAVENOUS | Status: AC
  Filled 2018-02-26 (×2): qty 1000

## 2018-02-26 MED ORDER — OXYCODONE HCL 5 MG PO TABS
10.0000 mg | ORAL_TABLET | ORAL | Status: DC | PRN
Start: 2018-02-26 — End: 2018-02-28

## 2018-02-26 MED ORDER — LIDOCAINE 5 % EX PTCH
MEDICATED_PATCH | CUTANEOUS | Status: DC | PRN
  Administered 2018-02-26: 1 via TRANSDERMAL

## 2018-02-26 MED ORDER — ENOXAPARIN SODIUM 40 MG/0.4ML ~~LOC~~ SOLN
40.0000 mg | SUBCUTANEOUS | Status: DC
  Administered 2018-02-27: 40 mg via SUBCUTANEOUS
  Filled 2018-02-26: qty 0.4

## 2018-02-26 MED ORDER — MAGNESIUM HYDROXIDE 400 MG/5ML PO SUSP: 30.0000 mL | ORAL | Status: DC | PRN

## 2018-02-26 MED ORDER — FENTANYL CITRATE (PF) 100 MCG/2ML IJ SOLN
INTRAMUSCULAR | Status: DC | PRN
  Administered 2018-02-26: 15 ug via INTRATHECAL

## 2018-02-26 MED ORDER — MORPHINE SULFATE (PF) 0.5 MG/ML IJ SOLN
INTRAMUSCULAR | Status: DC | PRN
  Administered 2018-02-26: .1 mg via INTRATHECAL

## 2018-02-26 MED ORDER — ONDANSETRON HCL 4 MG/2ML IJ SOLN
INTRAMUSCULAR | Status: DC | PRN
  Administered 2018-02-26: 4 mg via INTRAVENOUS

## 2018-02-26 MED ORDER — LIDOCAINE HCL (PF) 1 % IJ SOLN
INTRAMUSCULAR | Status: DC | PRN
  Administered 2018-02-26: 3 mL via SUBCUTANEOUS

## 2018-02-26 MED ORDER — PHENYLEPHRINE HCL 10 MG/ML IJ SOLN
INTRAMUSCULAR | Status: DC | PRN
  Administered 2018-02-26: 50 ug via INTRAVENOUS

## 2018-02-26 MED ORDER — DIPHENHYDRAMINE HCL 50 MG/ML IJ SOLN: 12.5000 mg | INTRAMUSCULAR | Status: DC | PRN

## 2018-02-26 MED ORDER — FENTANYL CITRATE (PF) 100 MCG/2ML IJ SOLN
INTRAMUSCULAR | Status: AC
  Filled 2018-02-26: qty 2

## 2018-02-26 MED ORDER — SODIUM CHLORIDE 0.9 % IV SOLN
2.0000 g | INTRAVENOUS | Status: AC
  Administered 2018-02-26: 2 g via INTRAVENOUS
  Filled 2018-02-26: qty 2

## 2018-02-26 SURGICAL SUPPLY — 27 items
BAG COUNTER SPONGE EZ (MISCELLANEOUS) ×2 IMPLANT
BAG SPNG 4X4 CLR HAZ (MISCELLANEOUS) ×1
CANISTER SUCT 3000ML PPV (MISCELLANEOUS) ×3 IMPLANT
CHLORAPREP W/TINT 26ML (MISCELLANEOUS) ×6 IMPLANT
COUNTER SPONGE BAG EZ (MISCELLANEOUS) ×1
DRESSING SURGICEL FIBRLLR 1X2 (HEMOSTASIS) IMPLANT
DRSG SURGICEL FIBRILLAR 1X2 (HEMOSTASIS) ×6
DRSG TELFA 3X8 NADH (GAUZE/BANDAGES/DRESSINGS) ×3 IMPLANT
ELECT REM PT RETURN 9FT ADLT (ELECTROSURGICAL) ×3
ELECTRODE REM PT RTRN 9FT ADLT (ELECTROSURGICAL) ×1 IMPLANT
GAUZE SPONGE 4X4 12PLY STRL (GAUZE/BANDAGES/DRESSINGS) ×3 IMPLANT
GLOVE BIO SURGEON STRL SZ 6.5 (GLOVE) ×7 IMPLANT
GLOVE BIO SURGEONS STRL SZ 6.5 (GLOVE) ×6
GLOVE INDICATOR 7.0 STRL GRN (GLOVE) ×3 IMPLANT
GOWN STRL REUS W/ TWL LRG LVL3 (GOWN DISPOSABLE) ×2 IMPLANT
GOWN STRL REUS W/TWL LRG LVL3 (GOWN DISPOSABLE) ×6
KIT TURNOVER KIT A (KITS) ×3 IMPLANT
NS IRRIG 1000ML POUR BTL (IV SOLUTION) ×3 IMPLANT
PACK C SECTION AR (MISCELLANEOUS) ×3 IMPLANT
PAD DRESSING TELFA 3X8 NADH (GAUZE/BANDAGES/DRESSINGS) ×1 IMPLANT
PAD OB MATERNITY 4.3X12.25 (PERSONAL CARE ITEMS) ×3 IMPLANT
PAD PREP 24X41 OB/GYN DISP (PERSONAL CARE ITEMS) ×3 IMPLANT
SUT MNCRL AB 4-0 PS2 18 (SUTURE) ×3 IMPLANT
SUT PLAIN 2 0 XLH (SUTURE) ×2 IMPLANT
SUT VIC AB 0 CT1 36 (SUTURE) ×14 IMPLANT
SUT VIC AB 3-0 SH 27 (SUTURE) ×6
SUT VIC AB 3-0 SH 27X BRD (SUTURE) ×1 IMPLANT

## 2018-02-26 NOTE — H&P (Signed)
Obstetric Preoperative History and Physical  Terri Holt is a 27 y.o. G2P1001 with IUP at [redacted]w[redacted]d presenting for presenting for scheduled repeat cesarean section with bilateral tubal ligation.  No acute concerns.   Prenatal Course Source of Care: [facility] (midwifery) with onset of care at 7 weeks Pregnancy complications or risks: Patient Active Problem List   Diagnosis Date Noted  . Labor and delivery, indication for care 02/26/2018  . History of cesarean delivery 07/20/2017  . Anxiety 08/03/2015  . Clinical depression 08/03/2015  . Brash 08/03/2015  . History of other specified conditions presenting hazards to health 08/03/2015  . H/O alcohol abuse 08/03/2015  . History of digestive disease 08/03/2015  . 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 History of migraine headaches 08/03/2015  . Infectious mononucleosis 08/03/2015  . Abnormal involuntary movements 08/03/2015   She plans to breastfeed She desires bilateral tubal ligation for postpartum contraception.   Prenatal labs and studies: ABO, Rh: --/--/O POS Performed at [facility], [address] [place], [place] [address]  [phone]) Antibody: NEG (04/02 0813) Rubella: 4.31 (08/23 1554) RPR: Non Reactive (04/02 0813)  HBsAg: Negative (08/23 1554)  HIV: NON REACTIVE (04/02 0813)  GBS: Negative (03/19 0823) 1 hr Glucola  (normal) Genetic screening normal Anatomy US normal   Past Medical History:  Diagnosis Date  . Anxiety   . Constipation   . GERD (gastroesophageal reflux disease)   . Migraines   . Mini stroke [facility])     Past Surgical History:  Procedure Laterality Date  . CESAREAN SECTION    . WISDOM TOOTH EXTRACTION      OB History  Gravida Para Term Preterm AB Living  2 1 1     1   SAB TAB Ectopic Multiple Live Births          1    # Outcome Date GA Lbr Len/2nd Weight Sex Delivery Anes PTL Lv  2 Current           1 Term 07/06/13 [redacted]w[redacted]d  7  lb 8 oz (3.402 kg) M CS-Unspec  N LIV    Social History   Socioeconomic History  . Marital status: Married    Spouse name: Not on file  . Number of children: Not on file  . Years of education: Not on file  . Highest education level: Not on file  Occupational History  . Not on file  Social Needs  . Financial resource strain: Not on file  . Food insecurity:    Worry: Not on file    Inability: Not on file  . Transportation needs:    Medical: Not on file    Non-medical: Not on file  Tobacco Use  . Smoking status: Former Smoker    Types: Cigarettes    Last attempt to quit: 06/26/2017    Years since quitting: 0.6  . Smokeless tobacco: Never Used  Substance and Sexual Activity  . Alcohol use: No    Alcohol/week: 0.0 oz    Comment: wine at night, occasionally  . Drug use: No  . Sexual activity: Yes    Partners: Male    Birth control/protection: None, Surgical    Comment: Pregnant   Lifestyle  . Physical activity:    Days per week: Not on file    Minutes per session: Not on file  . Stress: Not on file  Relationships  . Social connections:    Talks on phone: Not on file  Gets together: Not on file    Attends religious service: Not on file    Active member of club or organization: Not on file    Attends meetings of clubs or organizations: Not on file    Relationship status: Not on file  Other Topics Concern  . Not on file  Social History Narrative  . Not on file    Family History  Problem Relation Age of Onset  . Migraines Mother   . Asthma Brother   . Hyperlipidemia Brother   . Asthma Maternal Grandmother   . Hyperlipidemia Maternal Grandmother   . Heart failure Maternal Grandfather   . Hyperlipidemia Maternal Grandfather   . Heart failure Paternal Grandmother   . Cancer Paternal Grandmother        tumor  . Asthma Son   . Thyroid disease Maternal Aunt   . Cancer Maternal Aunt        melanoma    Medications Prior to Admission  Medication Sig Dispense  Refill Last Dose  . Calcium Carbonate Antacid (MAALOX PO) Take 1 Dose by mouth daily as needed (heartburn).   Past Week at Unknown time  . prenatal vitamin w/FE, FA (NATACHEW) 29-1 MG CHEW chewable tablet Chew 2 tablets by mouth daily at 12 noon.    02/25/2018 at Unknown time  . calcium carbonate (TUMS - DOSED IN MG ELEMENTAL CALCIUM) 500 MG chewable tablet Chew 3 tablets by mouth daily as needed for indigestion or heartburn.   Not Taking at Unknown time  . FLUoxetine (PROZAC) 20 MG capsule Take 1 capsule (20 mg total) by mouth daily. (Patient not taking: Reported on 02/17/2018) 30 capsule 6 Not Taking at Unknown time    Allergies  Allergen Reactions  . Amoxicillin Hives and Rash  . Penicillins Hives and Rash    Has patient had a PCN reaction causing immediate rash, facial/tongue/throat swelling, SOB or lightheadedness with hypotension: Yes Has patient had a PCN reaction causing severe rash involving mucus membranes or skin necrosis: Yes Has patient had a PCN reaction that required hospitalization: No Has patient had a PCN reaction occurring within the last 10 years: Yes If all of the above answers are "NO", then may proceed with Cephalosporin use.   . Adhesive [Tape]     Pull skin off.  Both paper tape and tegaderm are ok  . Latex Rash  . Nickel Hives and Rash    Review of Systems: Negative except for what is mentioned in HPI.  Physical Exam: BP 119/75 (BP Location: Left Arm)   Pulse 90   Temp 98.2 F (36.8 C) (Oral)   Resp 18   Ht 5\' 7"  (1.702 m)   Wt 183 lb (83 kg)   LMP 04/26/2017 (Approximate) Comment: spotted, hx irreg. menses  BMI 28.66 kg/m  FHR by Doppler: 140 bpm GENERAL: Well-developed, well-nourished female in no acute distress.  LUNGS: Clear to auscultation bilaterally.  HEART: Regular rate and rhythm. ABDOMEN: Soft, nontender, nondistended, gravid, well-healed Pfannenstiel incision. PELVIC: Deferred EXTREMITIES: Nontender, no edema, 2+ distal pulses.   Pertinent  Labs/Studies:   Results for orders placed or performed during the hospital encounter of 02/26/18 (from the past 72 hour(s))  ABO/Rh     Status: None   Collection Time: 02/26/18 11:34 AM  Result Value Ref Range   ABO/RH(D)      O POS Performed at [facility], [address] [place], [place] [address]     Assessment and Plan:  - Terri Holt  is a 27 y.o. G2P1001 at [redacted]w[redacted]d being admitted  for scheduled cesarean section delivery . The patient is understanding of the planned procedure and is aware of and accepting of all surgical risks, including but not limited to: bleeding which may require transfusion or reoperation; infection which may require antibiotics; injury to bowel, bladder, ureters or other surrounding organs which may require repair; injury to the fetus; need for additional procedures including hysterectomy in the event of life-threatening complications; placental abnormalities wth subsequent pregnancies; incisional problems; blood clot disorders which may require blood thinners;, and other postoperative/anesthesia complications. The patient is in agreement with the proposed plan, and gives informed written consent for the procedure. All questions have been answered. - Patient desires permanent sterilization.  Other reversible forms of contraception have previously been discussed with patient; she declines all other modalities. Risks of procedure discussed with patient including but not limited to: risk of regret, permanence of method, bleeding, infection, injury to surrounding organs and need for additional procedures.  Failure risk of 1-2 % with increased risk of ectopic gestation if pregnancy occurs was also discussed with patient.  Patient verbalized understanding of these risks and wants to proceed with sterilization.  Written informed consent obtained.  To OR when ready.   Hildred Laser, MD [facility]

## 2018-02-26 NOTE — Transfer of Care (Signed)
Immediate Anesthesia Transfer of Care Note  Patient: Terri Holt  Procedure(s) Performed: REPEAT CESAREAN SECTION (N/A )  Patient Location: PACU and Mother/Baby  Anesthesia Type:Spinal  Level of Consciousness: awake, alert  and oriented  Airway & Oxygen Therapy: Patient Spontanous Breathing  Post-op Assessment: Report given to RN and Post -op Vital signs reviewed and stable  Post vital signs: Reviewed and stable  Last Vitals:  Vitals Value Taken Time  BP 116/67 02/26/2018  1:57 PM  Temp 37.1 C 02/26/2018  1:57 PM  Pulse 64 02/26/2018  1:57 PM  Resp 13 02/26/2018  1:57 PM  SpO2 99 % 02/26/2018  1:57 PM    Last Pain:  Vitals:   02/26/18 1357  TempSrc: Oral         Complications: No apparent anesthesia complications

## 2018-02-26 NOTE — Op Note (Addendum)
Cesarean Section Procedure Note  Indications: patient declines vag del attempt and previous uterine incision low transverse x 1  Pre-operative Diagnosis: 39 week 0 day pregnancy, prior C-section x 1 (low transverse) declining TOLAC, desires permanent sterilization,  anxiety and depression.  Post-operative Diagnosis: same  Surgeon: Hildred LaserAnika Joeline Freer, MD  Assistants: Serafina RoyalsMichelle Lawhorn, CNM.  Procedure: Repeat Low Transverse Cesarean Section with Bilateral Tubal Ligation  Anesthesia: Spinal anesthesia   Procedure Details: The patient was seen in the Holding Room. The risks, benefits, complications, treatment options, and expected outcomes were discussed with the patient.  The patient concurred with the proposed plan, giving informed consent.  The site of surgery properly noted/marked. The patient was taken to the Operating Room, identified as Orland DecKatherine Gupton Crego and the procedure verified as C-Section Delivery.   After induction of anesthesia, the patient was draped and prepped in the usual sterile manner. Anesthesia was tested and noted to be adequate. A Time Out was held and the above information confirmed. A Pfannenstiel incision was made and carried down through the subcutaneous tissue to the fascia. Fascial incision was made and extended transversely. The fascia was separated from the underlying rectus tissue superiorly and inferiorly. The peritoneum was identified and entered. Peritoneal incision was extended longitudinally. The utero-vesical peritoneal reflection was incised transversely and the bladder flap was bluntly freed from the lower uterine segment. A low transverse uterine incision was made. Delivered from cephalic presentation was a 3240 gram Female with Apgar scores of 9 at one minute and 9 at five minutes.  Delayed cord clamping was performed. After the umbilical cord was clamped and cut, cord blood was obtained for evaluation. The placenta was removed intact and appeared normal. The  uterus was exteriorized from the abdomen. The uterine outline, tubes and ovaries appeared normal.  The uterine incision was closed with running locked suture of 0-Vicryl.  There was noted to be a serosal bleed, not hemostatic with coagulation using the bovie, so several figure-of-eight sutures of 3-0 Vicryl were used to achieve hemostasis.   Attention was then turned to the fallopian tubes, and where the patient's right fallopian tube was identified and grasped with a Babcock clamp.  The tube was then followed out to the vimbria.  The Babcock clamp was then used to grasp the tube approximately 4 cm from the cornual region.  A 3 cm segment of tube was then ligated with a free tie of 0-Chromic using the [place] method and excised.  The left fallopian tube was then ligated in a similar fashion and excised. The tubal lumens were cauterized bilaterally.  Good hemostasis was noted with bilateral fallopian tubes.    Attention was then returned to the uterine incision. Scant oozing was noted, Fibrillar was placed over the incision. Hemostasis was observed. The pericolic gutters were cleared of all clots and debris.  The fascia was then reapproximated with a running suture of 0-Vicryl. The subcutaneous fat layer was reapproximated with 3-0 Vicryl. The skin was reapproximated with 4-0 Monocryl.  Instrument, sponge, and needle counts were correct prior the abdominal closure and at the conclusion of the case.   Findings: Female infant, cephalic presentation, 3240 grams, with Apgar scores of 9 at one minute and 9 at five minutes. Intact placenta with 3 vessel cord.  The uterine outline, tubes and ovaries appeared normal.   Estimated Blood Loss:  500 ml      Drains: foley catheter to gravity drainage, 200 ml clear urine at end of the procedure  Total IV Fluids:  800 ml  Specimens: Placenta and Disposition:  Sent to Pathology         Implants: None         Complications:  None; patient tolerated the  procedure well.         Disposition: PACU - hemodynamically stable.         Condition: stable   Hildred Laser, MD [facility]

## 2018-02-26 NOTE — Anesthesia Post-op Follow-up Note (Signed)
Anesthesia QCDR form completed.        

## 2018-02-26 NOTE — Anesthesia Procedure Notes (Addendum)
Spinal  Patient location during procedure: OB Start time: 02/26/2018 12:20 PM End time: 02/26/2018 12:26 PM Staffing Anesthesiologist: Alver FisherPenwarden, Amy, MD Resident/CRNA: Laruth BouchardGolob, Meagen Limones C, RN Performed: resident/CRNA and anesthesiologist  Preanesthetic Checklist Completed: patient identified, site marked, surgical consent, pre-op evaluation, timeout performed, IV checked, risks and benefits discussed and monitors and equipment checked Spinal Block Patient position: sitting Prep: ChloraPrep Patient monitoring: heart rate, continuous pulse ox and blood pressure Approach: midline Location: L4-5 Injection technique: single-shot Needle Needle type: Pencil-Tip and Introducer  Needle gauge: 24 G Needle length: 10 cm

## 2018-02-26 NOTE — Anesthesia Preprocedure Evaluation (Signed)
Anesthesia Evaluation  Patient identified by MRN, date of birth, ID band Patient awake    Reviewed: Allergy & Precautions, NPO status , Patient's Chart, lab work & pertinent test results  History of Anesthesia Complications Negative for: history of anesthetic complications  Airway Mallampati: II  TM Distance: >3 FB Neck ROM: Full    Dental no notable dental hx.    Pulmonary neg sleep apnea, neg COPD, former smoker,    breath sounds clear to auscultation- rhonchi (-) wheezing      Cardiovascular Exercise Tolerance: Good (-) hypertension(-) CAD, (-) Past MI, (-) Cardiac Stents and (-) CABG  Rhythm:Regular Rate:Normal - Systolic murmurs and - Diastolic murmurs    Neuro/Psych  Headaches, PSYCHIATRIC DISORDERS Anxiety Depression    GI/Hepatic Neg liver ROS, GERD  ,  Endo/Other  negative endocrine ROSneg diabetes  Renal/GU negative Renal ROS     Musculoskeletal negative musculoskeletal ROS (+)   Abdominal Gravid abdomen  Peds  Hematology negative hematology ROS (+)   Anesthesia Other Findings Past Medical History: No date: Anxiety No date: Constipation No date: GERD (gastroesophageal reflux disease) No date: Migraines No date: Mini stroke (HCC)   Reproductive/Obstetrics (+) Pregnancy                             Lab Results  Component Value Date   WBC 13.1 (H) 02/25/2018   HGB 12.9 02/25/2018   HCT 37.6 02/25/2018   MCV 89.0 02/25/2018   PLT 249 02/25/2018    Anesthesia Physical Anesthesia Plan  ASA: II  Anesthesia Plan: Spinal   Post-op Pain Management:    Induction:   PONV Risk Score and Plan: 2 and Ondansetron  Airway Management Planned: Natural Airway  Additional Equipment:   Intra-op Plan:   Post-operative Plan:   Informed Consent: I have reviewed the patients History and Physical, chart, labs and discussed the procedure including the risks, benefits and  alternatives for the proposed anesthesia with the patient or authorized representative who has indicated his/her understanding and acceptance.   Dental advisory given  Plan Discussed with: CRNA and Anesthesiologist  Anesthesia Plan Comments:         Anesthesia Quick Evaluation

## 2018-02-26 NOTE — Anesthesia Procedure Notes (Deleted)
Spinal  Patient location during procedure: OR Start time: 02/26/2018 12:22 PM End time: 02/26/2018 12:26 PM Staffing Other anesthesia staff: Laruth BouchardGolob, Shireen Rayburn C, RN Performed: other anesthesia staff  Preanesthetic Checklist Completed: patient identified, site marked, surgical consent, pre-op evaluation, timeout performed, IV checked, risks and benefits discussed and monitors and equipment checked Spinal Block Patient position: sitting Prep: ChloraPrep Patient monitoring: heart rate, continuous pulse ox and blood pressure Approach: midline Location: L3-4 Injection technique: single-shot Needle Needle type: Pencan  Needle length: 10 cm Assessment Sensory level: T4

## 2018-02-27 ENCOUNTER — Encounter: Payer: Self-pay | Admitting: Obstetrics and Gynecology

## 2018-02-27 LAB — CBC
HEMATOCRIT: 34.8 % — AB (ref 35.0–47.0)
HEMOGLOBIN: 11.9 g/dL — AB (ref 12.0–16.0)
MCH: 30.6 pg (ref 26.0–34.0)
MCHC: 34.1 g/dL (ref 32.0–36.0)
MCV: 89.7 fL (ref 80.0–100.0)
Platelets: 189 10*3/uL (ref 150–440)
RBC: 3.88 MIL/uL (ref 3.80–5.20)
RDW: 13 % (ref 11.5–14.5)
WBC: 13.8 10*3/uL — AB (ref 3.6–11.0)

## 2018-02-27 NOTE — Anesthesia Postprocedure Evaluation (Signed)
Anesthesia Post Note  Patient: Terri Holt  Procedure(s) Performed: REPEAT CESAREAN SECTION (N/A )  Patient location during evaluation: Mother Baby Anesthesia Type: Spinal Level of consciousness: awake, awake and alert and oriented Pain management: pain level controlled Vital Signs Assessment: post-procedure vital signs reviewed and stable Respiratory status: spontaneous breathing Cardiovascular status: blood pressure returned to baseline Postop Assessment: no backache, no headache, no apparent nausea or vomiting and adequate PO intake Anesthetic complications: no     Last Vitals:  Vitals:   02/26/18 2311 02/27/18 0330  BP: 106/65 105/68  Pulse: 78 82  Resp: 20 20  Temp: 36.5 C 36.6 C  SpO2: 99% 98%    Last Pain:  Vitals:   02/27/18 0330  TempSrc: Oral  PainSc:                  Terri Caldwelleana Jalacia Holt

## 2018-02-27 NOTE — Progress Notes (Signed)
Postpartum Day # 1: Cesarean Delivery (repeat) with Bilateral Tubal Ligation  Subjective: Patient reports tolerating PO, + flatus and no problems voiding.  Denies nausea/vomiting. Pain well controlled.   Objective: Vital signs in last 24 hours: Temp:  [97.6 F (36.4 C)-98.7 F (37.1 C)] 97.9 F (36.6 C) (04/04 0330) Pulse Rate:  [60-90] 82 (04/04 0330) Resp:  [13-20] 20 (04/04 0330) BP: (102-119)/(64-75) 105/68 (04/04 0330) SpO2:  [98 %-100 %] 98 % (04/04 0330) Weight:  [183 lb (83 kg)] 183 lb (83 kg) (04/03 1032)  Physical Exam:  General: alert and no distress Lungs: clear to auscultation bilaterally Breasts: normal appearance, no masses or tenderness Heart: regular rate and rhythm, S1, S2 normal, no murmur, click, rub or gallop Abdomen: normal findings: bowel sounds normal, no masses palpable and soft, appropriately tender Pelvis: Lochia appropriate, Uterine Fundus firm, Incision: healing well, no significant drainage, no dehiscence, no significant erythema Extremities: DVT Evaluation: No evidence of DVT seen on physical exam. Negative Homan's sign. No cords or calf tenderness. No significant calf/ankle edema.    Recent Labs    02/25/18 0813 02/27/18 0600  HGB 12.9 11.9*  HCT 37.6 34.8*    Assessment/Plan: Status post Cesarean section. Doing well postoperatively.  Breastfeeding, Lactation consult as needed Circumcision after discharge  Contraception: intraoperative BTL Advance diet Continue PO pain management Continue current care. Plan for discharge home tomorrow.    Hildred LaserAnika Mylik Pro, MD [facility]

## 2018-02-27 NOTE — Progress Notes (Signed)
Post Partum Day 1  Subjective:  Doing well, holding infant. No questions or concerns. FOB at bedside.   Denies difficulty breathing or respiratory distress, chest pain, abdominal pain, excessive vaginal bleeding, dysuria, and leg pain or swelling.   Objective:  Temp:  [97.6 F (36.4 C)-98.7 F (37.1 C)] 98.6 F (37 C) (04/04 0823) Pulse Rate:  [60-90] 75 (04/04 0823) Resp:  [13-20] 18 (04/04 0823) BP: (99-119)/(60-75) 99/60 (04/04 0823) SpO2:  [98 %-100 %] 98 % (04/04 0823) Weight:  [183 lb (83 kg)] 183 lb (83 kg) (04/03 1032)  Physical Exam:   General: alert and cooperative  Lochia: appropriate  Uterine Fundus: firm  Incision: healing well, no significant drainage, no dehiscence, no significant erythema  DVT Evaluation: No evidence of DVT seen on physical exam. Negative Homan's sign.  Recent Labs    02/25/18 0813 02/27/18 0600  HGB 12.9 11.9*  HCT 37.6 34.8*    Assessment/Plan: Plan for discharge tomorrow, Lactation consult and Contraception BTL   LOS: 1 day    Gunnar BullaJenkins Michelle Stellarose Cerny, CNM [facility], [facility] 02/27/2018, 8:31 AM

## 2018-02-28 LAB — SURGICAL PATHOLOGY

## 2018-02-28 MED ORDER — FERROUS SULFATE 325 (65 FE) MG PO TABS: 325.0000 mg | ORAL_TABLET | Freq: Two times a day (BID) | ORAL | 3 refills | Status: DC

## 2018-02-28 MED ORDER — OXYCODONE-ACETAMINOPHEN 5-325 MG PO TABS: 1.0000 | ORAL_TABLET | ORAL | 0 refills | Status: DC | PRN

## 2018-02-28 MED ORDER — IBUPROFEN 800 MG PO TABS: 800.0000 mg | ORAL_TABLET | Freq: Four times a day (QID) | ORAL | 0 refills | Status: DC

## 2018-02-28 NOTE — Discharge Summary (Signed)
Obstetric Discharge Summary  Patient ID: Orland DecKatherine Gupton Cobbins MRN: [id number] DOB/AGE: [birth year] 27 y.o.   Date of Admission: 02/26/2018 Serafina RoyalsMichelle Skyllar Notarianni, CNM [facility](A. Cherry, MD)  Date of Discharge:  02/28/18 Serafina RoyalsMichelle Navia Lindahl, CNM [facility] Cross(D. Evans, MD)  Admitting Diagnosis: Scheduled cesarean section at [id number]  Secondary Diagnosis: Depression, Anxiety  Mode of Delivery: repeat cesarean section     Discharge Diagnosis: No other diagnosis   Intrapartum Procedures: Atificial rupture of membranes and tubal ligation   Post partum procedures: None  Complications: None   Brief Hospital Course    Orland DecKatherine Gupton Sturgill is a [id number] who underwent cesarean section on 02/26/2018.  Patient had an uncomplicated surgery; for further details of this surgery, please refer to the operative note.  Patient had an uncomplicated postpartum course.  By time of discharge on PPD#2, her pain was controlled on oral pain medications; she had appropriate lochia and was ambulating, voiding without difficulty, tolerating regular diet and passing flatus.   She was deemed stable for discharge to home.    Labs:  CBC Latest Ref Rng & Units 02/27/2018 02/25/2018 12/13/2017  WBC 3.6 - 11.0 K/uL 13.8(H) 13.1(H) 14.4(H)  Hemoglobin 12.0 - 16.0 g/dL 11.9(L) 12.9 11.9  Hematocrit 35.0 - 47.0 % 34.8(L) 37.6 35.4  Platelets 150 - 440 K/uL 189 249 204   O POS Performed at [facility], [address] [place], [place] [address]   Physical exam:   Temp:  [98.3 F (36.8 C)-98.5 F (36.9 C)] 98.5 F (36.9 C) (04/05 1641) Pulse Rate:  [73-86] 86 (04/05 1641) Resp:  [18-20] 18 (04/05 1641) BP: (103-111)/(58-72) 111/72 (04/05 1641) SpO2:  [95 %-100 %] 100 % (04/05 1641)  General: alert and no distress  Lochia: appropriate  Abdomen: soft, NT  Uterine Fundus: firm  Incision: healing well, no significant drainage, no dehiscence, no significant erythema  Extremities: No evidence of DVT seen on physical exam. No  lower extremity  edema.  Discharge Instructions: Per After Visit Summary.  Activity: Advance as tolerated. Pelvic rest for 6 weeks.  Also refer to After Visit Summary  Diet: Regular  Medications:  Allergies as of 02/28/2018      Reactions   Amoxicillin Hives, Rash   Penicillins Hives, Rash   Has patient had a PCN reaction causing immediate rash, facial/tongue/throat swelling, SOB or lightheadedness with hypotension: Yes Has patient had a PCN reaction causing severe rash involving mucus membranes or skin necrosis: Yes Has patient had a PCN reaction that required hospitalization: No Has patient had a PCN reaction occurring within the last 10 years: Yes If all of the above answers are "NO", then may proceed with Cephalosporin use.   Adhesive [tape]    Pull skin off.  Both paper tape and tegaderm are ok   Latex Rash   Nickel Hives, Rash      Medication List    STOP taking these medications   FLUoxetine 20 MG capsule Commonly known as:  PROZAC     TAKE these medications   calcium carbonate 500 MG chewable tablet Commonly known as:  TUMS - dosed in mg elemental calcium Chew 3 tablets by mouth daily as needed for indigestion or heartburn. What changed:  Another medication with the same name was removed. Continue taking this medication, and follow the directions you see here.   ferrous sulfate 325 (65 FE) MG tablet Take 1 tablet (325 mg total) by mouth 2 (two) times daily with a meal.   ibuprofen 800 MG tablet Commonly known as:  ADVIL,MOTRIN Take 1 tablet (800 mg total) by mouth every 6 (six) hours.   oxyCODONE-acetaminophen 5-325 MG tablet Commonly known as:  PERCOCET/ROXICET Take 1 tablet by mouth every 4 (four) hours as needed (pain scale 4-7).   prenatal vitamin w/FE, FA 29-1 MG Chew chewable tablet Chew 2 tablets by mouth daily at 12 noon.      Outpatient follow up:  Follow-up Information    [facility] Follow up in 1 week(s).   Why:  Call [facility] to  schedule your follow-up appointment for 1 week. Contact information: [address]  [address] [place] [address] [phone]         Postpartum contraception: bilateral tubal ligation  Discharged Condition: stable  Discharged to: home   Newborn Data:  Disposition:home with mother  Apgars: APGAR (1 MIN): 9   APGAR (5 MINS): 9   APGAR (10 MINS):    Baby Feeding: Breast   Gunnar Bulla, CNM [facility]

## 2018-02-28 NOTE — Discharge Instructions (Signed)
Cesarean Delivery °Cesarean birth, or cesarean delivery, is the surgical delivery of a baby through an incision in the abdomen and the uterus. This may be referred to as a C-section. This procedure may be scheduled ahead of time, or it may be done in an emergency situation. °Tell a health care provider about: °· Any allergies you have. °· All medicines you are taking, including vitamins, herbs, eye drops, creams, and over-the-counter medicines. °· Any problems you or family members have had with anesthetic medicines. °· Any blood disorders you have. °· Any surgeries you have had. °· Any medical conditions you have. °· Whether you or any members of your family have a history of deep vein thrombosis (DVT) or pulmonary embolism (PE). °What are the risks? °Generally, this is a safe procedure. However, problems may occur, including: °· Infection. °· Bleeding. °· Allergic reactions to medicines. °· Damage to other structures or organs. °· Blood clots. °· Injury to your baby. ° °What happens before the procedure? °· Follow instructions from your health care provider about eating or drinking restrictions. °· Follow instructions from your health care provider about bathing before your procedure to help reduce your risk of infection. °· If you know that you are going to have a cesarean delivery, do not shave your pubic area. Shaving before the procedure may increase your risk of infection. °· Ask your health care provider about: °? Changing or stopping your regular medicines. This is especially important if you are taking diabetes medicines or blood thinners. °? Your pain management plan. This is especially important if you plan to breastfeed your baby. °? How long you will be in the hospital after the procedure. °? Any concerns you may have about receiving blood products if you need them during the procedure. °? Cord blood banking, if you plan to collect your baby’s umbilical cord blood. °· You may also want to ask your  health care provider: °? Whether you will be able to hold or breastfeed your baby while you are still in the operating room. °? Whether your baby can stay with you immediately after the procedure and during your recovery. °? Whether a family member or a person of your choice can go with you into the operating room and stay with you during the procedure, immediately after the procedure, and during your recovery. °· Plan to have someone drive you home when you are discharged from the hospital. °What happens during the procedure? °· Fetal monitors will be placed on your abdomen to monitor your heart rate and your baby's heart rate. °· Depending on the reason for your cesarean delivery, you may have a physical exam or additional testing, such as an ultrasound. °· An IV tube will be inserted into one of your veins. °· You may have your blood or urine tested. °· You will be given antibiotic medicine to help prevent infection. °· You may be given a special warming gown to wear to keep your temperature stable. °· Hair may be removed from your pubic area. °· The skin of your pubic area and lower abdomen will be cleaned with a germ-killing solution (antiseptic). °· A catheter may be inserted into your bladder through your urethra. This drains your urine during the procedure. °· You may be given one or more of the following: °? A medicine to numb the area (local anesthetic). °? A medicine to make you fall asleep (general anesthetic). °? A medicine (regional anesthetic) that is injected into your back or through a small   thin tube placed in your back (spinal anesthetic or epidural anesthetic). This numbs everything below the injection site and allows you to stay awake during your procedure. If this makes you feel nauseous, tell your health care provider. Medicines will be available to help reduce any nausea you may feel.  An incision will be made in your abdomen, and then in your uterus.  If you are awake during your  procedure, you may feel tugging and pulling in your abdomen, but you should not feel pain. If you feel pain, tell your health care provider immediately.  Your baby will be removed from your uterus. You may feel more pressure or pushing while this happens.  Immediately after birth, your baby will be dried and kept warm. You may be able to hold and breastfeed your baby. The umbilical cord may be clamped and cut during this time.  Your placenta will be removed from your uterus.  Your incisions will be closed with stitches (sutures). Staples, skin glue, or adhesive strips may also be applied to the incision in your abdomen.  Bandages (dressings) will be placed over the incision in your abdomen. The procedure may vary among health care providers and hospitals. What happens after the procedure?  Your blood pressure, heart rate, breathing rate, and blood oxygen level will be monitored often until the medicines you were given have worn off.  You may continue to receive fluids and medicines through an IV tube.  You will have some pain. Medicines will be available to help control your pain.  To help prevent blood clots: ? You may be given medicines. ? You may have to wear compression stockings or devices. ? You will be encouraged to walk around when you are able.  Hospital staff will encourage and support bonding with your baby. Your hospital may allow you and your baby to stay in the same room (rooming in) during your hospital stay to encourage successful breastfeeding.  You may be encouraged to cough and breathe deeply often. This helps to prevent lung problems.  If you have a catheter draining your urine, it will be removed as soon as possible after your procedure. This information is not intended to replace advice given to you by your health care provider. Make sure you discuss any questions you have with your health care provider. Document Released: 11/12/2005 Document Revised: 04/19/2016  Document Reviewed: 08/23/2015 Elsevier Interactive Patient Education  2018 [employer].  Breastfeeding Choosing to breastfeed is one of the best decisions you can make for yourself and your baby. A change in hormones during pregnancy causes your breasts to make breast milk in your milk-producing glands. Hormones prevent breast milk from being released before your baby is born. They also prompt milk flow after birth. Once breastfeeding has begun, thoughts of your baby, as well as his or her sucking or crying, can stimulate the release of milk from your milk-producing glands. Benefits of breastfeeding Research shows that breastfeeding offers many health benefits for infants and mothers. It also offers a cost-free and convenient way to feed your baby. For your baby  Your first milk (colostrum) helps your baby's digestive system to function better.  Special cells in your milk (antibodies) help your baby to fight off infections.  Breastfed babies are less likely to develop asthma, allergies, obesity, or type 2 diabetes. They are also at lower risk for sudden infant death syndrome (SIDS).  Nutrients in breast milk are better able to meet your babys needs compared to infant formula.  Breast milk improves your baby's brain development. For you  Breastfeeding helps to create a very special bond between you and your baby.  Breastfeeding is convenient. Breast milk costs nothing and is always available at the correct temperature.  Breastfeeding helps to burn calories. It helps you to lose the weight that you gained during pregnancy.  Breastfeeding makes your uterus return faster to its size before pregnancy. It also slows bleeding (lochia) after you give birth.  Breastfeeding helps to lower your risk of developing type 2 diabetes, osteoporosis, rheumatoid arthritis, cardiovascular disease, and breast, ovarian, uterine, and endometrial cancer later in life. Breastfeeding basics Starting  breastfeeding  Find a comfortable place to sit or lie down, with your neck and back well-supported.  Place a pillow or a rolled-up blanket under your baby to bring him or her to the level of your breast (if you are seated). Nursing pillows are specially designed to help support your arms and your baby while you breastfeed.  Make sure that your baby's tummy (abdomen) is facing your abdomen.  Gently massage your breast. With your fingertips, massage from the outer edges of your breast inward toward the nipple. This encourages milk flow. If your milk flows slowly, you may need to continue this action during the feeding.  Support your breast with 4 fingers underneath and your thumb above your nipple (make the letter "C" with your hand). Make sure your fingers are well away from your nipple and your babys mouth.  Stroke your baby's lips gently with your finger or nipple.  When your baby's mouth is open wide enough, quickly bring your baby to your breast, placing your entire nipple and as much of the areola as possible into your baby's mouth. The areola is the colored area around your nipple. ? More areola should be visible above your baby's upper lip than below the lower lip. ? Your baby's lips should be opened and extended outward (flanged) to ensure an adequate, comfortable latch. ? Your baby's tongue should be between his or her lower gum and your breast.  Make sure that your baby's mouth is correctly positioned around your nipple (latched). Your baby's lips should create a seal on your breast and be turned out (everted).  It is common for your baby to suck about 2-3 minutes in order to start the flow of breast milk. Latching Teaching your baby how to latch onto your breast properly is very important. An improper latch can cause nipple pain, decreased milk supply, and poor weight gain in your baby. Also, if your baby is not latched onto your nipple properly, he or she may swallow some air  during feeding. This can make your baby fussy. Burping your baby when you switch breasts during the feeding can help to get rid of the air. However, teaching your baby to latch on properly is still the best way to prevent fussiness from swallowing air while breastfeeding. Signs that your baby has successfully latched onto your nipple  Silent tugging or silent sucking, without causing you pain. Infant's lips should be extended outward (flanged).  Swallowing heard between every 3-4 sucks once your milk has started to flow (after your let-down milk reflex occurs).  Muscle movement above and in front of his or her ears while sucking.  Signs that your baby has not successfully latched onto your nipple  Sucking sounds or smacking sounds from your baby while breastfeeding.  Nipple pain.  If you think your baby has not latched on correctly, slip  your finger into the corner of your babys mouth to break the suction and place it between your baby's gums. Attempt to start breastfeeding again. Signs of successful breastfeeding Signs from your baby  Your baby will gradually decrease the number of sucks or will completely stop sucking.  Your baby will fall asleep.  Your baby's body will relax.  Your baby will retain a small amount of milk in his or her mouth.  Your baby will let go of your breast by himself or herself.  Signs from you  Breasts that have increased in firmness, weight, and size 1-3 hours after feeding.  Breasts that are softer immediately after breastfeeding.  Increased milk volume, as well as a change in milk consistency and color by the fifth day of breastfeeding.  Nipples that are not sore, cracked, or bleeding.  Signs that your baby is getting enough milk  Wetting at least 1-2 diapers during the first 24 hours after birth.  Wetting at least 5-6 diapers every 24 hours for the first week after birth. The urine should be clear or pale yellow by the age of 5  days.  Wetting 6-8 diapers every 24 hours as your baby continues to grow and develop.  At least 3 stools in a 24-hour period by the age of 5 days. The stool should be soft and yellow.  At least 3 stools in a 24-hour period by the age of 7 days. The stool should be seedy and yellow.  No loss of weight greater than 10% of birth weight during the first 3 days of life.  Average weight gain of 4-7 oz (113-198 g) per week after the age of 4 days.  Consistent daily weight gain by the age of 5 days, without weight loss after the age of 2 weeks. After a feeding, your baby may spit up a small amount of milk. This is normal. Breastfeeding frequency and duration Frequent feeding will help you make more milk and can prevent sore nipples and extremely full breasts (breast engorgement). Breastfeed when you feel the need to reduce the fullness of your breasts or when your baby shows signs of hunger. This is called "breastfeeding on demand." Signs that your baby is hungry include:  Increased alertness, activity, or restlessness.  Movement of the head from side to side.  Opening of the mouth when the corner of the mouth or cheek is stroked (rooting).  Increased sucking sounds, smacking lips, cooing, sighing, or squeaking.  Hand-to-mouth movements and sucking on fingers or hands.  Fussing or crying.  Avoid introducing a pacifier to your baby in the first 4-6 weeks after your baby is born. After this time, you may choose to use a pacifier. Research has shown that pacifier use during the first year of a baby's life decreases the risk of sudden infant death syndrome (SIDS). Allow your baby to feed on each breast as long as he or she wants. When your baby unlatches or falls asleep while feeding from the first breast, offer the second breast. Because newborns are often sleepy in the first few weeks of life, you may need to awaken your baby to get him or her to feed. Breastfeeding times will vary from baby to  baby. However, the following rules can serve as a guide to help you make sure that your baby is properly fed:  Newborns (babies 81 weeks of age or younger) may breastfeed every 1-3 hours.  Newborns should not go without breastfeeding for longer than 3 hours  during the day or 5 hours during the night.  You should breastfeed your baby a minimum of 8 times in a 24-hour period.  Breast milk pumping Pumping and storing breast milk allows you to make sure that your baby is exclusively fed your breast milk, even at times when you are unable to breastfeed. This is especially important if you go back to work while you are still breastfeeding, or if you are not able to be present during feedings. Your lactation consultant can help you find a method of pumping that works best for you and give you guidelines about how long it is safe to store breast milk. Caring for your breasts while you breastfeed Nipples can become dry, cracked, and sore while breastfeeding. The following recommendations can help keep your breasts moisturized and healthy:  Avoid using soap on your nipples.  Wear a supportive bra designed especially for nursing. Avoid wearing underwire-style bras or extremely tight bras (sports bras).  Air-dry your nipples for 3-4 minutes after each feeding.  Use only cotton bra pads to absorb leaked breast milk. Leaking of breast milk between feedings is normal.  Use lanolin on your nipples after breastfeeding. Lanolin helps to maintain your skin's normal moisture barrier. Pure lanolin is not harmful (not toxic) to your baby. You may also hand express a few drops of breast milk and gently massage that milk into your nipples and allow the milk to air-dry.  In the first few weeks after giving birth, some women experience breast engorgement. Engorgement can make your breasts feel heavy, warm, and tender to the touch. Engorgement peaks within 3-5 days after you give birth. The following recommendations can  help to ease engorgement:  Completely empty your breasts while breastfeeding or pumping. You may want to start by applying warm, moist heat (in the shower or with warm, water-soaked hand towels) just before feeding or pumping. This increases circulation and helps the milk flow. If your baby does not completely empty your breasts while breastfeeding, pump any extra milk after he or she is finished.  Apply ice packs to your breasts immediately after breastfeeding or pumping, unless this is too uncomfortable for you. To do this: ? Put ice in a plastic bag. ? Place a towel between your skin and the bag. ? Leave the ice on for 20 minutes, 2-3 times a day.  Make sure that your baby is latched on and positioned properly while breastfeeding.  If engorgement persists after 48 hours of following these recommendations, contact your health care provider or a Advertising copywriter. Overall health care recommendations while breastfeeding  Eat 3 healthy meals and 3 snacks every day. Well-nourished mothers who are breastfeeding need an additional 450-500 calories a day. You can meet this requirement by increasing the amount of a balanced diet that you eat.  Drink enough water to keep your urine pale yellow or clear.  Rest often, relax, and continue to take your prenatal vitamins to prevent fatigue, stress, and low vitamin and mineral levels in your body (nutrient deficiencies).  Do not use any products that contain nicotine or tobacco, such as cigarettes and e-cigarettes. Your baby may be harmed by chemicals from cigarettes that pass into breast milk and exposure to secondhand smoke. If you need help quitting, ask your health care provider.  Avoid alcohol.  Do not use illegal drugs or marijuana.  Talk with your health care provider before taking any medicines. These include over-the-counter and prescription medicines as well as vitamins and herbal supplements.  Some medicines that may be harmful to your baby  can pass through breast milk.  It is possible to become pregnant while breastfeeding. If birth control is desired, ask your health care provider about options that will be safe while breastfeeding your baby. Where to find more information: [employer]: www.llli.org Contact a health care provider if:  You feel like you want to stop breastfeeding or have become frustrated with breastfeeding.  Your nipples are cracked or bleeding.  Your breasts are red, tender, or warm.  You have: ? Painful breasts or nipples. ? A swollen area on either breast. ? A fever or chills. ? Nausea or vomiting. ? Drainage other than breast milk from your nipples.  Your breasts do not become full before feedings by the fifth day after you give birth.  You feel sad and depressed.  Your baby is: ? Too sleepy to eat well. ? Having trouble sleeping. ? More than 77 week old and wetting fewer than 6 diapers in a 24-hour period. ? Not gaining weight by 44 days of age.  Your baby has fewer than 3 stools in a 24-hour period.  Your baby's skin or the white parts of his or her eyes become yellow. Get help right away if:  Your baby is overly tired (lethargic) and does not want to wake up and feed.  Your baby develops an unexplained fever. Summary  Breastfeeding offers many health benefits for infant and mothers.  Try to breastfeed your infant when he or she shows early signs of hunger.  Gently tickle or stroke your baby's lips with your finger or nipple to allow the baby to open his or her mouth. Bring the baby to your breast. Make sure that much of the areola is in your baby's mouth. Offer one side and burp the baby before you offer the other side.  Talk with your health care provider or lactation consultant if you have questions or you face problems as you breastfeed. This information is not intended to replace advice given to you by your health care provider. Make sure you discuss any  questions you have with your health care provider. Document Released: 11/12/2005 Document Revised: 12/14/2016 Document Reviewed: 12/14/2016 Elsevier Interactive Patient Education  [employer].

## 2018-02-28 NOTE — Progress Notes (Signed)
Mother viewed the DVD, "The Period of Purple Cry" prior to discharge home and understands all. DVD copy given to pt to take home for other family members as well.

## 2018-03-07 ENCOUNTER — Ambulatory Visit (INDEPENDENT_AMBULATORY_CARE_PROVIDER_SITE_OTHER): Payer: Medicaid Other | Admitting: Certified Nurse Midwife

## 2018-03-07 VITALS — BP 119/78 | HR 73 | Wt 175.0 lb

## 2018-03-07 DIAGNOSIS — Z4889 Encounter for other specified surgical aftercare: Secondary | ICD-10-CM

## 2018-03-07 NOTE — Progress Notes (Signed)
    OBSTETRICS/GYNECOLOGY POST-OPERATIVE CLINIC VISIT  Subjective:     Terri Holt is a 27 y.o. female who presents to the clinic 1 weeks status post repeat cesarean section  for incision check . Eating a regular diet without difficulty. Bowel movements are normal. pain controlled well with current medications  The following portions of the patient's history were reviewed and updated as appropriate: allergies, current medications, past family history, past medical history, past social history, past surgical history and problem list.  Review of Systems Pertinent items are noted in HPI.    Objective:    BP 119/78   Pulse 73   Wt 175 lb (79.4 kg)   LMP 04/26/2017 (Approximate) Comment: spotted, hx irreg. menses  Breastfeeding? Yes   BMI 27.41 kg/m  General:  alert and no distress  Heart RRR  Lungs Clear bilaterally   Abdomen: soft, bowel sounds active, non-tender  Incision:   healing well, no drainage, no erythema, no hernia, no seroma, no swelling, no dehiscence, incision well approximated    Pathology:    Assessment:    Doing well postoperatively.   Plan:   1. Continue any current medications. 2. Wound care discussed. 3. Operative findings again reviewed. Pathology report discussed. 4. Activity restrictions: continue with post cesarean section restrictions 5. Anticipated return to work: 2-3 weeks. 6. Follow up: 5 weeks for PPV    Doreene Burkehompson, Terri Holt, CNM [facility]

## 2018-03-07 NOTE — Progress Notes (Signed)
Pt is here for an incision check. C/o painful urination.

## 2018-03-07 NOTE — Patient Instructions (Signed)
Postpartum Depression and Baby Blues The postpartum period begins right after the birth of a baby. During this time, there is often a great amount of joy and excitement. It is also a time of many changes in the life of the parents. Regardless of how many times a mother gives birth, each child brings new challenges and dynamics to the family. It is not unusual to have feelings of excitement along with confusing shifts in moods, emotions, and thoughts. All mothers are at risk of developing postpartum depression or the "baby blues." These mood changes can occur right after giving birth, or they may occur many months after giving birth. The baby blues or postpartum depression can be mild or severe. Additionally, postpartum depression can go away rather quickly, or it can be a long-term condition. What are the causes? Raised hormone levels and the rapid drop in those levels are thought to be a main cause of postpartum depression and the baby blues. A number of hormones change during and after pregnancy. Estrogen and progesterone usually decrease right after the delivery of your baby. The levels of thyroid hormone and various cortisol steroids also rapidly drop. Other factors that play a role in these mood changes include major life events and genetics. What increases the risk? If you have any of the following risks for the baby blues or postpartum depression, know what symptoms to watch out for during the postpartum period. Risk factors that may increase the likelihood of getting the baby blues or postpartum depression include:  Having a personal or family history of depression.  Having depression while being pregnant.  Having premenstrual mood issues or mood issues related to oral contraceptives.  Having a lot of life stress.  Having marital conflict.  Lacking a social support network.  Having a baby with special needs.  Having health problems, such as diabetes.  What are the signs or  symptoms? Symptoms of baby blues include:  Brief changes in mood, such as going from extreme happiness to sadness.  Decreased concentration.  Difficulty sleeping.  Crying spells, tearfulness.  Irritability.  Anxiety.  Symptoms of postpartum depression typically begin within the first month after giving birth. These symptoms include:  Difficulty sleeping or excessive sleepiness.  Marked weight loss.  Agitation.  Feelings of worthlessness.  Lack of interest in activity or food.  Postpartum psychosis is a very serious condition and can be dangerous. Fortunately, it is rare. Displaying any of the following symptoms is cause for immediate medical attention. Symptoms of postpartum psychosis include:  Hallucinations and delusions.  Bizarre or disorganized behavior.  Confusion or disorientation.  How is this diagnosed? A diagnosis is made by an evaluation of your symptoms. There are no medical or lab tests that lead to a diagnosis, but there are various questionnaires that a health care provider may use to identify those with the baby blues, postpartum depression, or psychosis. Often, a screening tool called the Edinburgh Postnatal Depression Scale is used to diagnose depression in the postpartum period. How is this treated? The baby blues usually goes away on its own in 1-2 weeks. Social support is often all that is needed. You will be encouraged to get adequate sleep and rest. Occasionally, you may be given medicines to help you sleep. Postpartum depression requires treatment because it can last several months or longer if it is not treated. Treatment may include individual or group therapy, medicine, or both to address any social, physiological, and psychological factors that may play a role in the   depression. Regular exercise, a healthy diet, rest, and social support may also be strongly recommended. Postpartum psychosis is more serious and needs treatment right away.  Hospitalization is often needed. Follow these instructions at home:  Get as much rest as you can. Nap when the baby sleeps.  Exercise regularly. Some women find yoga and walking to be beneficial.  Eat a balanced and nourishing diet.  Do little things that you enjoy. Have a cup of tea, take a bubble bath, read your favorite magazine, or listen to your favorite music.  Avoid alcohol.  Ask for help with household chores, cooking, grocery shopping, or running errands as needed. Do not try to do everything.  Talk to people close to you about how you are feeling. Get support from your partner, family members, friends, or other new moms.  Try to stay positive in how you think. Think about the things you are grateful for.  Do not spend a lot of time alone.  Only take over-the-counter or prescription medicine as directed by your health care provider.  Keep all your postpartum appointments.  Let your health care provider know if you have any concerns. Contact a health care provider if: You are having a reaction to or problems with your medicine. Get help right away if:  You have suicidal feelings.  You think you may harm the baby or someone else. This information is not intended to replace advice given to you by your health care provider. Make sure you discuss any questions you have with your health care provider. Document Released: 08/16/2004 Document Revised: 04/19/2016 Document Reviewed: 08/24/2013 Elsevier Interactive Patient Education  2017 Elsevier Inc.  

## 2018-04-14 ENCOUNTER — Ambulatory Visit (INDEPENDENT_AMBULATORY_CARE_PROVIDER_SITE_OTHER): Payer: Medicaid Other | Admitting: Certified Nurse Midwife

## 2018-04-14 ENCOUNTER — Encounter: Payer: Medicaid Other | Admitting: Certified Nurse Midwife

## 2018-04-14 ENCOUNTER — Encounter: Payer: Self-pay | Admitting: Certified Nurse Midwife

## 2018-04-14 MED ORDER — FLUCONAZOLE 150 MG PO TABS: 150.0000 mg | ORAL_TABLET | Freq: Once | ORAL | 0 refills | Status: AC

## 2018-04-14 NOTE — Progress Notes (Unsigned)
Subjective:    Terri Holt is a 27 y.o. G3P2002 Caucasian female who presents for a postpartum visit. She is 6 weeks postpartum following a repeat cesarean section, low transverse incision at 39  gestational weeks. Anesthesia: spinal. I have fully reviewed the prenatal and intrapartum course. Postpartum course has been WNL. Baby's course has been WNL. Baby is feeding by breast. Bleeding no bleeding. Bowel function is normal. Bladder function is normal. Patient {is/is not:9024} sexually active. Last sexual activity: ***. Contraception method is tubal ligation. Postpartum depression screening: {neg default:13464::"negative"}. Score ***.  Last pap 08/21/18 and was negative.  The following portions of the patient's history were reviewed and updated as appropriate: allergies, current medications, past medical history, past surgical history and problem list.  Review of Systems Pertinent items are noted in HPI.   There were no vitals filed for this visit. Patient's last menstrual period was 04/26/2017 (approximate).  Objective:   General:  alert, cooperative and no distress   Breasts:  deferred, no complaints  Lungs: clear to auscultation bilaterally  Heart:  regular rate and rhythm  Abdomen: soft, nontender   Vulva: normal  Vagina: normal vagina  Cervix:  closed  Corpus: Well-involuted  Adnexa:  Non-palpable  Rectal Exam: no hemorrhoids        Assessment:   Postpartum exam 6 wks s/p R c/s Breastfeeding Depression screening Contraception counseling -tubal  Plan:  : tubal ligation Follow up in: 6 months for annual exam or earlier if needed  Doreene Burke, CNM

## 2018-04-14 NOTE — Progress Notes (Signed)
Subjective:    Terri Holt is a 27 y.o. G17P2002 Caucasian female who presents for a postpartum visit. She is 6 weeks postpartum following a repeat cesarean section, low transverse incision at 39 gestational weeks. Anesthesia: spinal. I have fully reviewed the prenatal and intrapartum course. Postpartum course has been WNL. Baby's course has been WNL Baby is feeding by bottle. Bleeding no bleeding. Bowel function is normal. Bladder function is normal. Patient is sexually active. . Contraception method is tubal ligation. Postpartum depression screening: negative. Score 7.  Last pap 08/18/17 and was negative.  The following portions of the patient's history were reviewed and updated as appropriate: allergies, current medications, past medical history, past surgical history and problem list.  Review of Systems Pertinent items are noted in HPI.   Vitals:   04/14/18 1125  BP: 120/77  Pulse: 68  Weight: 168 lb 7 oz (76.4 kg)  Height:  (1.702 m)   Patient's last menstrual period was 04/26/2017 (approximate).  Objective:   General:  alert, cooperative and no distress   Breasts:  deferred, no complaints  Lungs: clear to auscultation bilaterally  Heart:  regular rate and rhythm  Abdomen: soft, nontender   Vulva: Normal, red and irritated  Vagina: normal vagina  Cervix:  closed  Corpus: Well-involuted  Adnexa:  Non-palpable  Rectal Exam: no hemorrhoids        Assessment:   Postpartum exam 6 wks s/p repeat c/s Bottle feeding Depression screening-negative Contraception counseling   Plan:  : tubal ligation Follow up in: 6 months for annual exam or earlier if needed Pt complains of itching from Yeast. Diflucan ordered, pt instructed to use monistat externally to irritated areas. She verbalizes and agrees to plan.    Doreene Burke, CNM

## 2018-04-14 NOTE — Progress Notes (Signed)
Pt is here for a post partum visit. Bottle feeding. Had a T.L., has not had a period,has resumed intercourse with no problem. Would like a refill on percocet.Screening 15.

## 2018-04-14 NOTE — Patient Instructions (Signed)
Preventive Care 18-39 Years, Female Preventive care refers to lifestyle choices and visits with your health care provider that can promote health and wellness. What does preventive care include?  A yearly physical exam. This is also called an annual well check.  Dental exams once or twice a year.  Routine eye exams. Ask your health care provider how often you should have your eyes checked.  Personal lifestyle choices, including: ? Daily care of your teeth and gums. ? Regular physical activity. ? Eating a healthy diet. ? Avoiding tobacco and drug use. ? Limiting alcohol use. ? Practicing safe sex. ? Taking vitamin and mineral supplements as recommended by your health care provider. What happens during an annual well check? The services and screenings done by your health care provider during your annual well check will depend on your age, overall health, lifestyle risk factors, and family history of disease. Counseling Your health care provider may ask you questions about your:  Alcohol use.  Tobacco use.  Drug use.  Emotional well-being.  Home and relationship well-being.  Sexual activity.  Eating habits.  Work and work Statistician.  Method of birth control.  Menstrual cycle.  Pregnancy history.  Screening You may have the following tests or measurements:  Height, weight, and BMI.  Diabetes screening. This is done by checking your blood sugar (glucose) after you have not eaten for a while (fasting).  Blood pressure.  Lipid and cholesterol levels. These may be checked every 5 years starting at age 66.  Skin check.  Hepatitis C blood test.  Hepatitis B blood test.  Sexually transmitted disease (STD) testing.  BRCA-related cancer screening. This may be done if you have a family history of breast, ovarian, tubal, or peritoneal cancers.  Pelvic exam and Pap test. This may be done every 3 years starting at age 40. Starting at age 59, this may be done every 5  years if you have a Pap test in combination with an HPV test.  Discuss your test results, treatment options, and if necessary, the need for more tests with your health care provider. Vaccines Your health care provider may recommend certain vaccines, such as:  Influenza vaccine. This is recommended every year.  Tetanus, diphtheria, and acellular pertussis (Tdap, Td) vaccine. You may need a Td booster every 10 years.  Varicella vaccine. You may need this if you have not been vaccinated.  HPV vaccine. If you are 69 or younger, you may need three doses over 6 months.  Measles, mumps, and rubella (MMR) vaccine. You may need at least one dose of MMR. You may also need a second dose.  Pneumococcal 13-valent conjugate (PCV13) vaccine. You may need this if you have certain conditions and were not previously vaccinated.  Pneumococcal polysaccharide (PPSV23) vaccine. You may need one or two doses if you smoke cigarettes or if you have certain conditions.  Meningococcal vaccine. One dose is recommended if you are age 27-21 years and a first-year college student living in a residence hall, or if you have one of several medical conditions. You may also need additional booster doses.  Hepatitis A vaccine. You may need this if you have certain conditions or if you travel or work in places where you may be exposed to hepatitis A.  Hepatitis B vaccine. You may need this if you have certain conditions or if you travel or work in places where you may be exposed to hepatitis B.  Haemophilus influenzae type b (Hib) vaccine. You may need this if  you have certain risk factors.  Talk to your health care provider about which screenings and vaccines you need and how often you need them. This information is not intended to replace advice given to you by your health care provider. Make sure you discuss any questions you have with your health care provider. Document Released: 01/08/2002 Document Revised: 08/01/2016  Document Reviewed: 09/13/2015 Elsevier Interactive Patient Education  [employer].

## 2018-10-15 ENCOUNTER — Encounter: Payer: Medicaid Other | Admitting: Certified Nurse Midwife

## 2019-01-29 ENCOUNTER — Encounter: Payer: Self-pay | Admitting: Emergency Medicine

## 2019-01-29 ENCOUNTER — Observation Stay
Admission: EM | Admit: 2019-01-29 | Discharge: 2019-01-31 | Disposition: A | Discharge status: Other Acute Inpatient Hospital | Payer: Medicaid Other | Attending: Internal Medicine | Admitting: Internal Medicine

## 2019-01-29 ENCOUNTER — Other Ambulatory Visit: Payer: Self-pay

## 2019-01-29 DIAGNOSIS — K219 Gastro-esophageal reflux disease without esophagitis: Secondary | ICD-10-CM | POA: Diagnosis not present

## 2019-01-29 DIAGNOSIS — Z880 Allergy status to penicillin: Secondary | ICD-10-CM | POA: Diagnosis not present

## 2019-01-29 DIAGNOSIS — F419 Anxiety disorder, unspecified: Secondary | ICD-10-CM | POA: Diagnosis not present

## 2019-01-29 DIAGNOSIS — Z888 Allergy status to other drugs, medicaments and biological substances status: Secondary | ICD-10-CM | POA: Diagnosis not present

## 2019-01-29 DIAGNOSIS — T50902A Poisoning by unspecified drugs, medicaments and biological substances, intentional self-harm, initial encounter: Secondary | ICD-10-CM

## 2019-01-29 DIAGNOSIS — F332 Major depressive disorder, recurrent severe without psychotic features: Secondary | ICD-10-CM | POA: Diagnosis not present

## 2019-01-29 DIAGNOSIS — F1910 Other psychoactive substance abuse, uncomplicated: Secondary | ICD-10-CM | POA: Diagnosis not present

## 2019-01-29 DIAGNOSIS — T50901A Poisoning by unspecified drugs, medicaments and biological substances, accidental (unintentional), initial encounter: Secondary | ICD-10-CM | POA: Diagnosis present

## 2019-01-29 DIAGNOSIS — Z8673 Personal history of transient ischemic attack (TIA), and cerebral infarction without residual deficits: Secondary | ICD-10-CM | POA: Insufficient documentation

## 2019-01-29 DIAGNOSIS — F1310 Sedative, hypnotic or anxiolytic abuse, uncomplicated: Secondary | ICD-10-CM | POA: Insufficient documentation

## 2019-01-29 DIAGNOSIS — Z91040 Latex allergy status: Secondary | ICD-10-CM | POA: Insufficient documentation

## 2019-01-29 DIAGNOSIS — F1510 Other stimulant abuse, uncomplicated: Secondary | ICD-10-CM | POA: Insufficient documentation

## 2019-01-29 DIAGNOSIS — T1491XA Suicide attempt, initial encounter: Secondary | ICD-10-CM

## 2019-01-29 DIAGNOSIS — R05 Cough: Secondary | ICD-10-CM

## 2019-01-29 DIAGNOSIS — F17210 Nicotine dependence, cigarettes, uncomplicated: Secondary | ICD-10-CM | POA: Diagnosis not present

## 2019-01-29 DIAGNOSIS — M25531 Pain in right wrist: Secondary | ICD-10-CM

## 2019-01-29 DIAGNOSIS — R059 Cough, unspecified: Secondary | ICD-10-CM

## 2019-01-29 DIAGNOSIS — T424X2A Poisoning by benzodiazepines, intentional self-harm, initial encounter: Secondary | ICD-10-CM | POA: Diagnosis not present

## 2019-01-29 LAB — CBC WITH DIFFERENTIAL/PLATELET
Abs Immature Granulocytes: 0.06 10*3/uL (ref 0.00–0.07)
BASOS ABS: 0 10*3/uL (ref 0.0–0.1)
Basophils Relative: 0 %
EOS PCT: 0 %
Eosinophils Absolute: 0 10*3/uL (ref 0.0–0.5)
HEMATOCRIT: 52.6 % — AB (ref 36.0–46.0)
Hemoglobin: 17.4 g/dL — ABNORMAL HIGH (ref 12.0–15.0)
IMMATURE GRANULOCYTES: 1 %
LYMPHS ABS: 1.1 10*3/uL (ref 0.7–4.0)
Lymphocytes Relative: 10 %
MCH: 30.6 pg (ref 26.0–34.0)
MCHC: 33.1 g/dL (ref 30.0–36.0)
MCV: 92.4 fL (ref 80.0–100.0)
Monocytes Absolute: 0.3 10*3/uL (ref 0.1–1.0)
Monocytes Relative: 3 %
NRBC: 0 % (ref 0.0–0.2)
Neutro Abs: 9.2 10*3/uL — ABNORMAL HIGH (ref 1.7–7.7)
Neutrophils Relative %: 86 %
Platelets: 257 10*3/uL (ref 150–400)
RBC: 5.69 MIL/uL — ABNORMAL HIGH (ref 3.87–5.11)
RDW: 13.1 % (ref 11.5–15.5)
WBC: 10.8 10*3/uL — ABNORMAL HIGH (ref 4.0–10.5)

## 2019-01-29 LAB — URINE DRUG SCREEN, QUALITATIVE (ARMC ONLY)
Amphetamines, Ur Screen: POSITIVE — AB
Barbiturates, Ur Screen: NOT DETECTED
Benzodiazepine, Ur Scrn: POSITIVE — AB
Cannabinoid 50 Ng, Ur ~~LOC~~: NOT DETECTED
Cocaine Metabolite,Ur ~~LOC~~: NOT DETECTED
MDMA (Ecstasy)Ur Screen: NOT DETECTED
METHADONE SCREEN, URINE: NOT DETECTED
Opiate, Ur Screen: NOT DETECTED
Phencyclidine (PCP) Ur S: NOT DETECTED
TRICYCLIC, UR SCREEN: NOT DETECTED

## 2019-01-29 LAB — URINALYSIS, COMPLETE (UACMP) WITH MICROSCOPIC
BACTERIA UA: NONE SEEN
Bilirubin Urine: NEGATIVE
Glucose, UA: NEGATIVE mg/dL
Hgb urine dipstick: NEGATIVE
Ketones, ur: NEGATIVE mg/dL
Leukocytes,Ua: NEGATIVE
Nitrite: NEGATIVE
PROTEIN: NEGATIVE mg/dL
Specific Gravity, Urine: 1.01 (ref 1.005–1.030)
pH: 6 (ref 5.0–8.0)

## 2019-01-29 LAB — ETHANOL: Alcohol, Ethyl (B): <10 mg/dL (ref <10)

## 2019-01-29 LAB — COMPREHENSIVE METABOLIC PANEL
ALBUMIN: 4.6 g/dL (ref 3.5–5.0)
ALT: 16 U/L (ref 0–44)
AST: 26 U/L (ref 15–41)
Alkaline Phosphatase: 67 U/L (ref 38–126)
Anion gap: 7 (ref 5–15)
BUN: 7 mg/dL (ref 6–20)
CHLORIDE: 110 mmol/L (ref 98–111)
CO2: 25 mmol/L (ref 22–32)
CREATININE: 0.69 mg/dL (ref 0.44–1.00)
Calcium: 9.6 mg/dL (ref 8.9–10.3)
GFR calc non Af Amer: >60 mL/min (ref >60)
GLUCOSE: 96 mg/dL (ref 70–99)
Potassium: 4.9 mmol/L (ref 3.5–5.1)
SODIUM: 142 mmol/L (ref 135–145)
Total Bilirubin: 0.9 mg/dL (ref 0.3–1.2)
Total Protein: 7.8 g/dL (ref 6.5–8.1)

## 2019-01-29 LAB — ACETAMINOPHEN LEVEL: Acetaminophen (Tylenol), Serum: <10 ug/mL — ABNORMAL LOW (ref 10–30)

## 2019-01-29 LAB — MAGNESIUM: Magnesium: 2 mg/dL (ref 1.7–2.4)

## 2019-01-29 LAB — SALICYLATE LEVEL: Salicylate Lvl: <7 mg/dL (ref 2.8–30.0)

## 2019-01-29 LAB — PREGNANCY, URINE: Preg Test, Ur: NEGATIVE

## 2019-01-29 MED ORDER — ENOXAPARIN SODIUM 30 MG/0.3ML ~~LOC~~ SOLN
30.0000 mg | SUBCUTANEOUS | Status: DC
  Filled 2019-01-29: qty 0.3

## 2019-01-29 MED ORDER — POLYETHYLENE GLYCOL 3350 17 G PO PACK: 17.0000 g | PACK | Freq: Every day | ORAL | Status: DC | PRN

## 2019-01-29 MED ORDER — ONDANSETRON HCL 4 MG/2ML IJ SOLN: 4.0000 mg | Freq: Four times a day (QID) | INTRAMUSCULAR | Status: DC | PRN

## 2019-01-29 MED ORDER — ONDANSETRON HCL 4 MG PO TABS: 4.0000 mg | ORAL_TABLET | Freq: Four times a day (QID) | ORAL | Status: DC | PRN

## 2019-01-29 MED ORDER — ACETAMINOPHEN 650 MG RE SUPP: 650.0000 mg | Freq: Four times a day (QID) | RECTAL | Status: DC | PRN

## 2019-01-29 MED ORDER — ACETAMINOPHEN 325 MG PO TABS
650.0000 mg | ORAL_TABLET | Freq: Four times a day (QID) | ORAL | Status: DC | PRN
  Administered 2019-01-30: 650 mg via ORAL
  Filled 2019-01-29: qty 2

## 2019-01-29 NOTE — ED Triage Notes (Addendum)
Pt from home via EMS. Pt's mother called EMS at 1230pm; per mother pt lives with grandparents, pt ingested multiple drugs that are not prescribed to pt; per mother this is not pt's first time ingested "multiple drugs". Per EMS pt was found in bed, confused; EMS VS: BP 130/70; HR 74; SpO2 98%; CBG 171. Upon arrival pt is confused, disoriented. NAD noted at this time. VSS. Family at bedside.

## 2019-01-29 NOTE — ED Notes (Signed)
Med bottles w/ various medications that pt is reported to have taken sent to pharmacy for count and identification of medications. The bottles do not have lids that fit securely and one bottle labeled as prednisone had no lid at all. Bottles and pills secured in clear plastic lab bag and all contents and outside of bag labeled w/ pt's sticker for ID. Pharmacy called to inform.

## 2019-01-29 NOTE — H&P (Signed)
[facility] [place] [facility]   PATIENT NAME: Terri Holt    MR#:  [id number]  DATE OF BIRTH:  June 07, 1991  DATE OF ADMISSION:  01/29/2019  PRIMARY CARE PHYSICIAN: Anola Gurney, PA   REQUESTING/REFERRING PHYSICIAN: Dorothea Glassman, MD  CHIEF COMPLAINT:   Chief Complaint  Patient presents with  . Drug Overdose    HISTORY OF PRESENT ILLNESS:  Milca Mcerlean  is a 28 y.o. female with a known history of anxiety and migraines who presents to the ED with a drug overdose and suicide attempt.  History obtained from chart, as patient is asleep and difficult to awaken.  She was found next to suicide notes and a bottle of amiodarone and citalopram.  Poison control was called and recommended cardiac monitoring for 24 hours.  In the ED, vitals were unremarkable.  Labs are significant for WBC 10.8, hemoglobin 17.4.  Tylenol level was negative.  UDS positive for benzos and amphetamines.  Hospitalists were called for admission.  PAST MEDICAL HISTORY:   Past Medical History:  Diagnosis Date  . Anxiety   . Constipation   . GERD (gastroesophageal reflux disease)   . Migraines   . Mini stroke (HCC)     PAST SURGICAL HISTORY:   Past Surgical History:  Procedure Laterality Date  . CESAREAN SECTION    . CESAREAN SECTION N/A 02/26/2018   Procedure: REPEAT CESAREAN SECTION;  Surgeon: Hildred Laser, MD;  Location: [facility] ORS;  Service: Obstetrics;  Laterality: N/A;  . WISDOM TOOTH EXTRACTION      SOCIAL HISTORY:   Social History   Tobacco Use  . Smoking status: Former Smoker    Types: Cigarettes    Last attempt to quit: 06/26/2017    Years since quitting: 1.5  . Smokeless tobacco: Never Used  Substance Use Topics  . Alcohol use: No    Alcohol/week: 0.0 standard drinks    Comment: wine at night, occasionally    FAMILY HISTORY:   Family History  Problem Relation Age of Onset  . Migraines Mother   . Asthma Brother   . Hyperlipidemia Brother   . Asthma  Maternal Grandmother   . Hyperlipidemia Maternal Grandmother   . Heart failure Maternal Grandfather   . Hyperlipidemia Maternal Grandfather   . Heart failure Paternal Grandmother   . Cancer Paternal Grandmother        tumor  . Asthma Son   . Thyroid disease Maternal Aunt   . Cancer Maternal Aunt        melanoma    DRUG ALLERGIES:   Allergies  Allergen Reactions  . Amoxicillin Hives and Rash  . Penicillins Hives and Rash    Has patient had a PCN reaction causing immediate rash, facial/tongue/throat swelling, SOB or lightheadedness with hypotension: Yes Has patient had a PCN reaction causing severe rash involving mucus membranes or skin necrosis: Yes Has patient had a PCN reaction that required hospitalization: No Has patient had a PCN reaction occurring within the last 10 years: Yes If all of the above answers are "NO", then may proceed with Cephalosporin use.   . Adhesive [Tape]     Pull skin off.  Both paper tape and tegaderm are ok  . Latex Rash  . Nickel Hives and Rash    REVIEW OF SYSTEMS:   ROS-unable to obtain due to sedation  MEDICATIONS AT HOME:   Prior to Admission medications   Medication Sig Start Date End Date Taking? Authorizing Provider  prenatal vitamin w/FE, FA (  NATACHEW) 29-1 MG CHEW chewable tablet Chew 2 tablets by mouth daily at 12 noon.     [provider]      VITAL SIGNS:  Blood pressure 110/81, pulse (!) 59, resp. rate 17, height 5\' 6"  (1.676 m), weight 68 kg, SpO2 98 %, not currently breastfeeding.  PHYSICAL EXAMINATION:  Physical Exam  GENERAL:  28 y.o.-year-old patient lying in the bed with no acute distress.  Sleeping comfortably. EYES: Pupils equal, round, reactive to light and accommodation. No scleral icterus. Extraocular muscles intact.  HEENT: Head atraumatic, normocephalic. Oropharynx and nasopharynx clear.  NECK:  Supple, no jugular venous distention. No thyroid enlargement, no tenderness.  LUNGS: Normal breath sounds  bilaterally, no wheezing, rales,rhonchi or crepitation. No use of accessory muscles of respiration.  CARDIOVASCULAR: RRR, S1, S2 normal. No murmurs, rubs, or gallops.  ABDOMEN: Soft, nontender, nondistended. Bowel sounds present. No organomegaly or mass.  EXTREMITIES: No pedal edema, cyanosis, or clubbing.  NEUROLOGIC: Unable to assess secondary to sedation PSYCHIATRIC: Unable to assess SKIN: No obvious rash, lesion, or ulcer.   LABORATORY PANEL:   CBC Recent Labs  Lab 01/29/19 1332  WBC 10.8*  HGB 17.4*  HCT 52.6*  PLT 257   ------------------------------------------------------------------------------------------------------------------  Chemistries  Recent Labs  Lab 01/29/19 1332  NA 142  K 4.9  CL 110  CO2 25  GLUCOSE 96  BUN 7  CREATININE 0.69  CALCIUM 9.6  MG 2.0  AST 26  ALT 16  ALKPHOS 67  BILITOT 0.9   ------------------------------------------------------------------------------------------------------------------  Cardiac Enzymes No results for input(s): TROPONINI in the last 168 hours. ------------------------------------------------------------------------------------------------------------------  RADIOLOGY:  No results found.    IMPRESSION AND PLAN:   Drug overdose/suicide attempt- patient found next to a bottle of amiodarone and citalopram.  Poison control recommending 24 hours of cardiac monitoring.  If she develops seizures, they recommend using benzos. Psychiatry consulted.  Polysubstance use- UDS positive for benzos and amphetamines.  Would benefit from substance abuse counseling when she awakens.  All the records are reviewed and case discussed with ED provider. Management plans discussed with the patient, family and they are in agreement.  CODE STATUS: full  TOTAL TIME TAKING CARE OF THIS PATIENT: 45 minutes.    Jinny Blossom Tatia Petrucci M.D on 01/29/2019 at 4:40 PM  Between 7am to 6pm - Pager - [phone]  After 6pm go to [email or web address] -  Social research officer, government  [facility]  Office  [phone]  CC: Primary care physician; Anola Gurney, PA   Note: This dictation was prepared with Dragon dictation along with smaller phrase technology. Any transcriptional errors that result from this process are unintentional.

## 2019-01-29 NOTE — ED Provider Notes (Signed)
[facility] Emergency Department Provider Note   ____________________________________________   First MD Initiated Contact with Patient 01/29/19 1303     (approximate)  I have reviewed the triage vital signs and the nursing notes.   HISTORY  Chief Complaint Drug Overdose  History limited by grogginess  HPI Terri Holt is a 28 y.o. female who was found in bed surrounded by multiple pill bottles very sleepy.  She comes in here she still very groggy unsteady but wants to leave.  She has multiple suicide notes with her and 3 pill bottles 1 of amiodarone 200 mg with 22 pills missing 1 of citalopram which is old and has multiple different pills than that and 1 of prednisone 10 mg there are also apparently other pill bottles around her in the bed.  Mom was looking for for a while and reports her car was in the garage with the door closed she does not know if the patient was in the car before she went up into bed.  We will get a carboxyhemoglobin level.  Just told me that at 207.  She herself is unable to provide much history at all and she is very groggy.        Past Medical History:  Diagnosis Date  . Anxiety   . Constipation   . GERD (gastroesophageal reflux disease)   . Migraines   . Mini stroke [facility])     Patient Active Problem List   Diagnosis Date Noted  . S/P cesarean section 02/26/2018  . Anxiety 08/03/2015  . History of migraine headaches 08/03/2015  . Abnormal involuntary movements 08/03/2015    Past Surgical History:  Procedure Laterality Date  . CESAREAN SECTION    . CESAREAN SECTION N/A 02/26/2018   Procedure: REPEAT CESAREAN SECTION;  Surgeon: Hildred Laser, MD;  Location: [facility] ORS;  Service: Obstetrics;  Laterality: N/A;  . WISDOM TOOTH EXTRACTION      Prior to Admission medications   Medication Sig Start Date End Date Taking? Authorizing Provider  ferrous sulfate 325 (65 FE) MG tablet Take 1 tablet (325 mg total) by mouth 2  (two) times daily with a meal. Patient not taking: Reported on 04/14/2018 02/28/18   Gunnar Bulla, CNM  ibuprofen (ADVIL,MOTRIN) 800 MG tablet Take 1 tablet (800 mg total) by mouth every 6 (six) hours. Patient not taking: Reported on 04/14/2018 02/28/18   Gunnar Bulla, CNM  oxyCODONE-acetaminophen (PERCOCET/ROXICET) 5-325 MG tablet Take 1 tablet by mouth every 4 (four) hours as needed (pain scale 4-7). Patient not taking: Reported on 04/14/2018 02/28/18   Gunnar Bulla, CNM  prenatal vitamin w/FE, FA (NATACHEW) 29-1 MG CHEW chewable tablet Chew 2 tablets by mouth daily at 12 noon.     [provider]    Allergies Amoxicillin; Penicillins; Adhesive [tape]; Latex; and Nickel  Family History  Problem Relation Age of Onset  . Migraines Mother   . Asthma Brother   . Hyperlipidemia Brother   . Asthma Maternal Grandmother   . Hyperlipidemia Maternal Grandmother   . Heart failure Maternal Grandfather   . Hyperlipidemia Maternal Grandfather   . Heart failure Paternal Grandmother   . Cancer Paternal Grandmother        tumor  . Asthma Son   . Thyroid disease Maternal Aunt   . Cancer Maternal Aunt        melanoma    Social History Social History   Tobacco Use  . Smoking status: Former Smoker  Types: Cigarettes    Last attempt to quit: 06/26/2017    Years since quitting: 1.5  . Smokeless tobacco: Never Used  Substance Use Topics  . Alcohol use: No    Alcohol/week: 0.0 standard drinks    Comment: wine at night, occasionally  . Drug use: No    Review of Systems Unobtainable due to grogginess ____________________________________________   PHYSICAL EXAM:  VITAL SIGNS: ED Triage Vitals [01/29/19 1329]  Enc Vitals Group     BP 120/80     Pulse Rate 66     Resp      Temp      Temp src      SpO2 100 %     Weight      Height      Head Circumference      Peak Flow      Pain Score      Pain Loc      Pain Edu?      Excl. in GC?      Constitutional: Groggy saying she wants to leave..  Patient does not smell like exhaust Eyes: Conjunctivae are normal. PERRL. EOMI. Head: Atraumatic. Nose: No congestion/rhinnorhea. Mouth/Throat: Mucous membranes are moist.  Oropharynx non-erythematous. Neck: No stridor. Cardiovascular: Normal rate, regular rhythm. Grossly normal heart sounds.  Good peripheral circulation. Respiratory: Normal respiratory effort.  No retractions. Lungs CTAB. Gastrointestinal: Soft and nontender. No distention. No abdominal bruits. No CVA tenderness. Musculoskeletal: No lower extremity tenderness nor edema.  Neurologic: Slurry speech very groggy patient is moving everything equally and well somewhat uncoordinated though. Skin:  Skin is warm, dry and intact. No rash noted. .  ____________________________________________   LABS (all labs ordered are listed, but only abnormal results are displayed)  Labs Reviewed  CBC WITH DIFFERENTIAL/PLATELET - Abnormal; Notable for the following components:      Result Value   WBC 10.8 (*)    RBC 5.69 (*)    Hemoglobin 17.4 (*)    HCT 52.6 (*)    Neutro Abs 9.2 (*)    All other components within normal limits  COMPREHENSIVE METABOLIC PANEL  ACETAMINOPHEN LEVEL  ETHANOL  SALICYLATE LEVEL  PREGNANCY, URINE  URINALYSIS, COMPLETE (UACMP) WITH MICROSCOPIC  URINE DRUG SCREEN, QUALITATIVE ([facility] ONLY)  MAGNESIUM  COOXEMETRY PANEL   ____________________________________________  EKG  EKG read interpreted by me shows normal sinus rhythm rate of 70 normal axis no acute ST-T wave changes QRS is 85 ms QTC is 418 ms ____________________________________________  RADIOLOGY  ED MD interpretation:   Official radiology report(s): No results found.  ____________________________________________   PROCEDURES  Procedure(s) performed (including Critical Care):  Procedures gust with poison control he would recommend monitoring for 24 hours or more because of the  citalopram and amiodarone.  Citalopram may cause seizures which would require benzodiazepines or barbiturates.   ____________________________________________   INITIAL IMPRESSION / ASSESSMENT AND PLAN / ED COURSE We are uncertain what she drug she actually did take.  We will get our Tylenol and aspirin levels etc. back and get her in the hospital for monitoring. .[facility] Tylenol and aspirin levels are negative.  We will get her in the hospital for her 24 hours of monitoring and psych can see them.             ____________________________________________   FINAL CLINICAL IMPRESSION(S) / ED DIAGNOSES  Final diagnoses:  Intentional drug overdose, initial encounter [facility])     ED Discharge Orders    None  Note:  This document was prepared using Dragon voice recognition software and may include unintentional dictation errors.    Arnaldo Natal, MD 01/29/19 [phone]

## 2019-01-29 NOTE — ED Notes (Signed)
Pt transported to [facility] via ED tech, Sarah and ODS officer.

## 2019-01-29 NOTE — ED Notes (Signed)
IVC/ Consult pending/ Admitted Medically  

## 2019-01-29 NOTE — Progress Notes (Signed)
Poison control called to ask about ED EKG.  Unable to find where an EKG was done in ED; Will have EKG performed at this time. Windy Carina, RN; 10:58 PM 01/29/2019

## 2019-01-29 NOTE — ED Notes (Signed)
ED TO INPATIENT HANDOFF REPORT  ED Nurse Name and Phone #: Apolonio Schneiders [phone]  S Name/Age/Gender Terri Holt 28 y.o. female Room/Bed: ED21A/ED21AA  Code Status   Code Status: Prior  Home/SNF/Other Home Patient oriented to: self Is this baseline? No   Triage Complete: Triage complete  Chief Complaint overdose  Triage Note Pt from home via EMS. Pt's mother called EMS at 1230pm; per mother pt lives with grandparents, pt ingested multiple drugs that are not prescribed to pt; per mother this is not pt's first time ingested "multiple drugs". Per EMS pt was found in bed, confused; EMS VS: BP 130/70; HR 74; SpO2 98%; CBG 171. Upon arrival pt is confused, disoriented. NAD noted at this time. VSS. Family at bedside.    Allergies Allergies  Allergen Reactions  . Amoxicillin Hives and Rash  . Penicillins Hives and Rash    Has patient had a PCN reaction causing immediate rash, facial/tongue/throat swelling, SOB or lightheadedness with hypotension: Yes Has patient had a PCN reaction causing severe rash involving mucus membranes or skin necrosis: Yes Has patient had a PCN reaction that required hospitalization: No Has patient had a PCN reaction occurring within the last 10 years: Yes If all of the above answers are "NO", then may proceed with Cephalosporin use.   . Adhesive [Tape]     Pull skin off.  Both paper tape and tegaderm are ok  . Latex Rash  . Nickel Hives and Rash    Level of Care/Admitting Diagnosis ED Disposition    ED Disposition Condition [facility] Area: [place] [100120]  Level of Care: Med-Surg [16]  Diagnosis: Drug overdose [601093]  Admitting Physician: Hyman Bible DODD [[id number]]  Attending Physician: Hyman Bible DODD [[id number]]  PT Class (Do Not Modify): Observation [104]  PT Acc Code (Do Not Modify): Observation [10022]       B Medical/Surgery History Past Medical History:  Diagnosis Date  . Anxiety   .  Constipation   . GERD (gastroesophageal reflux disease)   . Migraines   . Mini stroke [facility])    Past Surgical History:  Procedure Laterality Date  . CESAREAN SECTION    . CESAREAN SECTION N/A 02/26/2018   Procedure: REPEAT CESAREAN SECTION;  Surgeon: Rubie Maid, MD;  Location: [facility] ORS;  Service: Obstetrics;  Laterality: N/A;  . WISDOM TOOTH EXTRACTION       A IV Location/Drains/Wounds Patient Lines/Drains/Airways Status   Active Line/Drains/Airways    Name:   Placement date:   Placement time:   Site:   Days:   Incision (Closed) 02/26/18 Abdomen   02/26/18    1255     337          Intake/Output Last 24 hours No intake or output data in the 24 hours ending 01/29/19 1654  Labs/Imaging Results for orders placed or performed during the hospital encounter of 01/29/19 (from the past 48 hour(s))  Comprehensive metabolic panel     Status: None   Collection Time: 01/29/19  1:32 PM  Result Value Ref Range   Sodium 142 135 - 145 mmol/L   Potassium 4.9 3.5 - 5.1 mmol/L    Comment: HEMOLYSIS AT THIS LEVEL MAY AFFECT RESULT   Chloride 110 98 - 111 mmol/L   CO2 25 22 - 32 mmol/L   Glucose, Bld 96 70 - 99 mg/dL   BUN 7 6 - 20 mg/dL   Creatinine, Ser 0.69 0.44 - 1.00 mg/dL   Calcium 9.6  8.9 - 10.3 mg/dL   Total Protein 7.8 6.5 - 8.1 g/dL   Albumin 4.6 3.5 - 5.0 g/dL   AST 26 15 - 41 U/L   ALT 16 0 - 44 U/L   Alkaline Phosphatase 67 38 - 126 U/L   Total Bilirubin 0.9 0.3 - 1.2 mg/dL   GFR calc non Af Amer >60 >60 mL/min   GFR calc Af Amer >60 >60 mL/min   Anion gap 7 5 - 15    Comment: Performed at [facility], [address] [place], [place] [address]  CBC with Differential     Status: Abnormal   Collection Time: 01/29/19  1:32 PM  Result Value Ref Range   WBC 10.8 (H) 4.0 - 10.5 K/uL   RBC 5.69 (H) 3.87 - 5.11 MIL/uL   Hemoglobin 17.4 (H) 12.0 - 15.0 g/dL   HCT 52.6 (H) 36.0 - 46.0 %   MCV 92.4 80.0 - 100.0 fL   MCH 30.6 26.0 - 34.0 pg   MCHC 33.1 30.0 - 36.0  g/dL   RDW 13.1 11.5 - 15.5 %   Platelets 257 150 - 400 K/uL   nRBC 0.0 0.0 - 0.2 %   Neutrophils Relative % 86 %   Neutro Abs 9.2 (H) 1.7 - 7.7 K/uL   Lymphocytes Relative 10 %   Lymphs Abs 1.1 0.7 - 4.0 K/uL   Monocytes Relative 3 %   Monocytes Absolute 0.3 0.1 - 1.0 K/uL   Eosinophils Relative 0 %   Eosinophils Absolute 0.0 0.0 - 0.5 K/uL   Basophils Relative 0 %   Basophils Absolute 0.0 0.0 - 0.1 K/uL   Immature Granulocytes 1 %   Abs Immature Granulocytes 0.06 0.00 - 0.07 K/uL    Comment: Performed at [facility], [address] [place], [place] [address]  Urinalysis, Complete w Microscopic     Status: Abnormal   Collection Time: 01/29/19  1:32 PM  Result Value Ref Range   Color, Urine YELLOW (A) YELLOW   APPearance CLEAR (A) CLEAR   Specific Gravity, Urine 1.010 1.005 - 1.030   pH 6.0 5.0 - 8.0   Glucose, UA NEGATIVE NEGATIVE mg/dL   Hgb urine dipstick NEGATIVE NEGATIVE   Bilirubin Urine NEGATIVE NEGATIVE   Ketones, ur NEGATIVE NEGATIVE mg/dL   Protein, ur NEGATIVE NEGATIVE mg/dL   Nitrite NEGATIVE NEGATIVE   Leukocytes,Ua NEGATIVE NEGATIVE   RBC / HPF 0-5 0 - 5 RBC/hpf   WBC, UA 0-5 0 - 5 WBC/hpf   Bacteria, UA NONE SEEN NONE SEEN   Squamous Epithelial / LPF 0-5 0 - 5   Mucus PRESENT     Comment: Performed at [facility], [address] [place], [place] [address]  Urine Drug Screen, Qualitative     Status: Abnormal   Collection Time: 01/29/19  1:32 PM  Result Value Ref Range   Tricyclic, Ur Screen NONE DETECTED NONE DETECTED   Amphetamines, Ur Screen POSITIVE (A) NONE DETECTED   MDMA (Ecstasy)Ur Screen NONE DETECTED NONE DETECTED   Cocaine Metabolite,Ur [place] NONE DETECTED NONE DETECTED   Opiate, Ur Screen NONE DETECTED NONE DETECTED   Phencyclidine (PCP) Ur S NONE DETECTED NONE DETECTED   Cannabinoid 50 Ng, Ur [place] NONE DETECTED NONE DETECTED   Barbiturates, Ur Screen NONE DETECTED NONE DETECTED   Benzodiazepine, Ur Scrn POSITIVE (A) NONE  DETECTED   Methadone Scn, Ur NONE DETECTED NONE DETECTED    Comment: (NOTE) Tricyclics + metabolites, urine  Cutoff 1000 ng/mL Amphetamines + metabolites, urine  Cutoff 1000 ng/mL MDMA (Ecstasy), urine              Cutoff 500 ng/mL Cocaine Metabolite, urine          Cutoff 300 ng/mL Opiate + metabolites, urine        Cutoff 300 ng/mL Phencyclidine (PCP), urine         Cutoff 25 ng/mL Cannabinoid, urine                 Cutoff 50 ng/mL Barbiturates + metabolites, urine  Cutoff 200 ng/mL Benzodiazepine, urine              Cutoff 200 ng/mL Methadone, urine                   Cutoff 300 ng/mL The urine drug screen provides only a preliminary, unconfirmed analytical test result and should not be used for non-medical purposes. Clinical consideration and professional judgment should be applied to any positive drug screen result due to possible interfering substances. A more specific alternate chemical method must be used in order to obtain a confirmed analytical result. Gas chromatography / mass spectrometry (GC/MS) is the preferred confirmat ory method. Performed at [facility], [address] [place], [place] [address]   Magnesium     Status: None   Collection Time: 01/29/19  1:32 PM  Result Value Ref Range   Magnesium 2.0 1.7 - 2.4 mg/dL    Comment: Performed at [facility], [address] [place], [place] [address]  Cooxemetry Panel, hospital-performed (carboxy, met, total hgb, O2 sat)     Status: Abnormal (Preliminary result)   Collection Time: 01/29/19  2:20 PM  Result Value Ref Range   O2 Saturation 96.7 %   Carboxyhemoglobin 2.9 (H) 0.5 - 1.5 %   Methemoglobin 1.1 0.0 - 1.5 %    Comment: Performed at [facility], [address] [place], [place] [address]   Total oxygen content PENDING 15.0 - 23.0 mL/dL  Acetaminophen level     Status: Abnormal   Collection Time: 01/29/19  2:20 PM  Result Value Ref Range   Acetaminophen (Tylenol), Serum <10  (L) 10 - 30 ug/mL    Comment: (NOTE) Therapeutic concentrations vary significantly. A range of 10-30 ug/mL  may be an effective concentration for many patients. However, some  are best treated at concentrations outside of this range. Acetaminophen concentrations >150 ug/mL at 4 hours after ingestion  and >50 ug/mL at 12 hours after ingestion are often associated with  toxic reactions. Performed at [facility], [address] [place], [place] [address]   Ethanol     Status: None   Collection Time: 01/29/19  2:20 PM  Result Value Ref Range   Alcohol, Ethyl (B) <10 <10 mg/dL    Comment: (NOTE) Lowest detectable limit for serum alcohol is 10 mg/dL. For medical purposes only. Performed at [facility], [address] [place], [place] [address]   Salicylate level     Status: None   Collection Time: 01/29/19  2:20 PM  Result Value Ref Range   Salicylate Lvl <1.3 2.8 - 30.0 mg/dL    Comment: Performed at [facility], [address] [place], [place] [address]  Pregnancy, urine     Status: None   Collection Time: 01/29/19  3:32 PM  Result Value Ref Range   Preg Test, Ur NEGATIVE NEGATIVE    Comment: Performed at [facility], [address] [place], [place] [address]   No results found.  Pending Labs [employer] (From admission, onward)    Start     Ordered   Signed and Held  CBC  (enoxaparin (LOVENOX)    CrCl < 30 ml/min)  Once,   R    Comments:  Baseline for enoxaparin therapy IF NOT ALREADY DRAWN.  Notify MD if PLT < 100 K.    Signed and Held   Signed and Held  Creatinine, serum  (enoxaparin (LOVENOX)    CrCl < 30 ml/min)  Once,   R    Comments:  Baseline for enoxaparin therapy IF NOT ALREADY DRAWN.    Signed and Held   Signed and Held  Creatinine, serum  (enoxaparin (LOVENOX)    CrCl < 30 ml/min)  Weekly,   R    Comments:  while on enoxaparin therapy.    Signed and Held   Signed and Held  Basic metabolic panel  Tomorrow  morning,   R     Signed and Held   Signed and Held  CBC  Tomorrow morning,   R     Signed and Held          Vitals/Pain Today's Vitals   01/29/19 1329 01/29/19 1337 01/29/19 1620  BP: 120/80  110/81  Pulse: 66  (!) 59  Resp:   17  SpO2: 100%  98%  Weight:  68 kg   Height:  5' 6" (1.676 m)     Isolation Precautions No active isolations  Medications Medications - No data to display  Mobility non-ambulatory High fall risk   Focused Assessments Psychicatric   R Recommendations: See Admitting Provider Note  Report given to:   Additional Notes: Pt is somnolent and poorly responsive to pain stimuli. Pt does actively resist repositioning but has not verbally interacted w/ RN since assuming care at 1500. Pt was in and out cathed for urine w/o resistance or cooperation.

## 2019-01-29 NOTE — ED Notes (Signed)
IVC/ Consult pending/ Admitted Medically

## 2019-01-29 NOTE — ED Notes (Signed)
Mother POC: Bennetta Laos [phone]

## 2019-01-29 NOTE — Consult Note (Signed)
[facility] Face-to-Face Psychiatry Consult   Reason for Consult:  Intentional overdose in suicide attempt. Patient wrote multiple suicide letters. Referring Physician:  Dr. Roxan Hockey Patient Identification: Terri Holt MRN:  [id number] Principal Diagnosis: Drug overdose Diagnosis:  Active Problems:   Drug overdose  Patient is seen, chart is reviewed Total Time spent with patient: 20 minutes  Subjective:  Patient sedated and not verbal.  Will need further assessment once medically clear and awake and alert.  HPI:  Terri Holt is a 28 y.o. female patient who was found in bed surrounded by multiple pill bottles very sleepy.  She comes in here she still very groggy unsteady but wants to leave.  She has multiple suicide notes with her and 3 pill bottles 1 of amiodarone 200 mg with 22 pills missing 1 of citalopram which is old and has multiple different pills than that and 1 of prednisone 10 mg there are also apparently other pill bottles around her in the bed.  Mom was looking for a while and reports her car was in the garage with the door closed she does not know if the patient was in the car before she went up into bed.  Carboxyhemoglobin level was elevated at 2.9.  She herself is unable to provide much history at all and she is very groggy.  Past Psychiatric History: anxiety  Risk to Self:  yes Risk to Others:  no Prior Inpatient Therapy:  none known Prior Outpatient Therapy:  none known  Past Medical History:  Past Medical History:  Diagnosis Date  . Anxiety   . Constipation   . GERD (gastroesophageal reflux disease)   . Migraines   . Mini stroke [facility])     Past Surgical History:  Procedure Laterality Date  . CESAREAN SECTION    . CESAREAN SECTION N/A 02/26/2018   Procedure: REPEAT CESAREAN SECTION;  Surgeon: Hildred Laser, MD;  Location: [facility] ORS;  Service: Obstetrics;  Laterality: N/A;  . WISDOM TOOTH EXTRACTION     Family History:  Family History  Problem  Relation Age of Onset  . Migraines Mother   . Asthma Brother   . Hyperlipidemia Brother   . Asthma Maternal Grandmother   . Hyperlipidemia Maternal Grandmother   . Heart failure Maternal Grandfather   . Hyperlipidemia Maternal Grandfather   . Heart failure Paternal Grandmother   . Cancer Paternal Grandmother        tumor  . Asthma Son   . Thyroid disease Maternal Aunt   . Cancer Maternal Aunt        melanoma   Family Psychiatric  History: unknown  Social History:  Social History   Substance and Sexual Activity  Alcohol Use No  . Alcohol/week: 0.0 standard drinks   Comment: wine at night, occasionally     Social History   Substance and Sexual Activity  Drug Use No    Social History   Socioeconomic History  . Marital status: Married    Spouse name: Not on file  . Number of children: Not on file  . Years of education: Not on file  . Highest education level: Not on file  Occupational History  . Not on file  Social Needs  . Financial resource strain: Not on file  . Food insecurity:    Worry: Not on file    Inability: Not on file  . Transportation needs:    Medical: Not on file    Non-medical: Not on file  Tobacco Use  . Smoking  status: Former Smoker    Types: Cigarettes    Last attempt to quit: 06/26/2017    Years since quitting: 1.5  . Smokeless tobacco: Never Used  Substance and Sexual Activity  . Alcohol use: No    Alcohol/week: 0.0 standard drinks    Comment: wine at night, occasionally  . Drug use: No  . Sexual activity: Yes    Partners: Male    Birth control/protection: None, Surgical    Comment: Pregnant   Lifestyle  . Physical activity:    Days per week: Not on file    Minutes per session: Not on file  . Stress: Not on file  Relationships  . Social connections:    Talks on phone: Not on file    Gets together: Not on file    Attends religious service: Not on file    Active member of club or organization: Not on file    Attends meetings of  clubs or organizations: Not on file    Relationship status: Not on file  Other Topics Concern  . Not on file  Social History Narrative  . Not on file   Additional Social History: Lives with husband and children. Mother is a support person.    Allergies:   Allergies  Allergen Reactions  . Amoxicillin Hives and Rash  . Penicillins Hives and Rash    Has patient had a PCN reaction causing immediate rash, facial/tongue/throat swelling, SOB or lightheadedness with hypotension: Yes Has patient had a PCN reaction causing severe rash involving mucus membranes or skin necrosis: Yes Has patient had a PCN reaction that required hospitalization: No Has patient had a PCN reaction occurring within the last 10 years: Yes If all of the above answers are "NO", then may proceed with Cephalosporin use.   . Adhesive [Tape]     Pull skin off.  Both paper tape and tegaderm are ok  . Latex Rash  . Nickel Hives and Rash    Labs:  Results for orders placed or performed during the hospital encounter of 01/29/19 (from the past 48 hour(s))  Comprehensive metabolic panel     Status: None   Collection Time: 01/29/19  1:32 PM  Result Value Ref Range   Sodium 142 135 - 145 mmol/L   Potassium 4.9 3.5 - 5.1 mmol/L    Comment: HEMOLYSIS AT THIS LEVEL MAY AFFECT RESULT   Chloride 110 98 - 111 mmol/L   CO2 25 22 - 32 mmol/L   Glucose, Bld 96 70 - 99 mg/dL   BUN 7 6 - 20 mg/dL   Creatinine, Ser 4.09 0.44 - 1.00 mg/dL   Calcium 9.6 8.9 - 81.1 mg/dL   Total Protein 7.8 6.5 - 8.1 g/dL   Albumin 4.6 3.5 - 5.0 g/dL   AST 26 15 - 41 U/L   ALT 16 0 - 44 U/L   Alkaline Phosphatase 67 38 - 126 U/L   Total Bilirubin 0.9 0.3 - 1.2 mg/dL   GFR calc non Af Amer >60 >60 mL/min   GFR calc Af Amer >60 >60 mL/min   Anion gap 7 5 - 15    Comment: Performed at [facility], [address] [place], [place] [address]  CBC with Differential     Status: Abnormal   Collection Time: 01/29/19  1:32 PM  Result Value  Ref Range   WBC 10.8 (H) 4.0 - 10.5 K/uL   RBC 5.69 (H) 3.87 - 5.11 MIL/uL   Hemoglobin 17.4 (H) 12.0 -  15.0 g/dL   HCT 76.2 (H) 26.3 - 33.5 %   MCV 92.4 80.0 - 100.0 fL   MCH 30.6 26.0 - 34.0 pg   MCHC 33.1 30.0 - 36.0 g/dL   RDW 45.6 25.6 - 38.9 %   Platelets 257 150 - 400 K/uL   nRBC 0.0 0.0 - 0.2 %   Neutrophils Relative % 86 %   Neutro Abs 9.2 (H) 1.7 - 7.7 K/uL   Lymphocytes Relative 10 %   Lymphs Abs 1.1 0.7 - 4.0 K/uL   Monocytes Relative 3 %   Monocytes Absolute 0.3 0.1 - 1.0 K/uL   Eosinophils Relative 0 %   Eosinophils Absolute 0.0 0.0 - 0.5 K/uL   Basophils Relative 0 %   Basophils Absolute 0.0 0.0 - 0.1 K/uL   Immature Granulocytes 1 %   Abs Immature Granulocytes 0.06 0.00 - 0.07 K/uL    Comment: Performed at [facility], [address] [place], [place] [address]    No current facility-administered medications for this encounter.    Current Outpatient Medications  Medication Sig Dispense Refill  . ferrous sulfate 325 (65 FE) MG tablet Take 1 tablet (325 mg total) by mouth 2 (two) times daily with a meal. (Patient not taking: Reported on 04/14/2018) 60 tablet 3  . ibuprofen (ADVIL,MOTRIN) 800 MG tablet Take 1 tablet (800 mg total) by mouth every 6 (six) hours. (Patient not taking: Reported on 04/14/2018) 30 tablet 0  . oxyCODONE-acetaminophen (PERCOCET/ROXICET) 5-325 MG tablet Take 1 tablet by mouth every 4 (four) hours as needed (pain scale 4-7). (Patient not taking: Reported on 04/14/2018) 30 tablet 0  . prenatal vitamin w/FE, FA (NATACHEW) 29-1 MG CHEW chewable tablet Chew 2 tablets by mouth daily at 12 noon.       Musculoskeletal: Strength & Muscle Tone: NA Gait & Station: unable to assess Patient leans: N/A  Psychiatric Specialty Exam: Physical Exam  Nursing note and vitals reviewed. See Physical Exam by ED physician  Review of Systems  Unable to perform ROS: Acuity of condition    Blood pressure 120/80, pulse 66, height 5\' 6"  (1.676 m),  weight 68 kg, SpO2 100 %, not currently breastfeeding.Body mass index is 24.21 kg/m.  General Appearance: Disheveled  Eye Contact:  None  Speech:  NA  Volume:  no-verbal  Mood:  NA  Affect:  NA  Thought Process:  NA  Orientation:  NA  Thought Content:  NA  Suicidal Thoughts:  Yes.  with intent/plan  Homicidal Thoughts:  na  Memory:  NA  Judgement:  NA  Insight:  NA  Psychomotor Activity:  Psychomotor Retardation  Concentration:  Concentration: NA and Attention Span: NA  Recall:  NA  Fund of Knowledge:  NA  Language:  NA  Akathisia:  NA  Handed:  NA  AIMS (if indicated):     Assets:  Housing  ADL's:  Impaired  Cognition:  Impaired,  Severe  Sleep:   sedated from drug overdose at time of assessment     Treatment Plan Summary: Please see scanned suicide notes.  Recommend further collateral from family. Please place new consult order for psychiatry once patient is awake and alert.   Disposition: Recommend psychiatric Inpatient admission when medically cleared.  Mariel Craft, MD 01/29/2019 2:14 PM

## 2019-01-29 NOTE — ED Notes (Signed)
Hospitalist to bedside at this time 

## 2019-01-29 NOTE — ED Notes (Addendum)
Note entered in error; removed per this RN.

## 2019-01-30 ENCOUNTER — Observation Stay: Payer: Medicaid Other

## 2019-01-30 DIAGNOSIS — F1510 Other stimulant abuse, uncomplicated: Secondary | ICD-10-CM

## 2019-01-30 DIAGNOSIS — F332 Major depressive disorder, recurrent severe without psychotic features: Secondary | ICD-10-CM | POA: Diagnosis not present

## 2019-01-30 DIAGNOSIS — T1491XA Suicide attempt, initial encounter: Secondary | ICD-10-CM

## 2019-01-30 LAB — COOXEMETRY PANEL
CARBOXYHEMOGLOBIN: 2.9 % — AB (ref 0.5–1.5)
Methemoglobin: 1.1 % (ref 0.0–1.5)
O2 Saturation: 96.7 %

## 2019-01-30 LAB — CBC
HEMATOCRIT: 53.1 % — AB (ref 36.0–46.0)
Hemoglobin: 17.3 g/dL — ABNORMAL HIGH (ref 12.0–15.0)
MCH: 30.5 pg (ref 26.0–34.0)
MCHC: 32.6 g/dL (ref 30.0–36.0)
MCV: 93.5 fL (ref 80.0–100.0)
Platelets: 223 10*3/uL (ref 150–400)
RBC: 5.68 MIL/uL — ABNORMAL HIGH (ref 3.87–5.11)
RDW: 13.2 % (ref 11.5–15.5)
WBC: 13.2 10*3/uL — AB (ref 4.0–10.5)
nRBC: 0 % (ref 0.0–0.2)

## 2019-01-30 LAB — BASIC METABOLIC PANEL
Anion gap: 8 (ref 5–15)
BUN: 12 mg/dL (ref 6–20)
CO2: 24 mmol/L (ref 22–32)
Calcium: 9.1 mg/dL (ref 8.9–10.3)
Chloride: 111 mmol/L (ref 98–111)
Creatinine, Ser: 0.61 mg/dL (ref 0.44–1.00)
GFR calc non Af Amer: >60 mL/min (ref >60)
Glucose, Bld: 87 mg/dL (ref 70–99)
Potassium: 3.8 mmol/L (ref 3.5–5.1)
Sodium: 143 mmol/L (ref 135–145)

## 2019-01-30 MED ORDER — NICOTINE 21 MG/24HR TD PT24
21.0000 mg | MEDICATED_PATCH | Freq: Every day | TRANSDERMAL | Status: DC
  Administered 2019-01-30: 21 mg via TRANSDERMAL
  Filled 2019-01-30: qty 1

## 2019-01-30 MED ORDER — ENOXAPARIN SODIUM 40 MG/0.4ML ~~LOC~~ SOLN
40.0000 mg | SUBCUTANEOUS | Status: DC
  Administered 2019-01-30: 40 mg via SUBCUTANEOUS
  Filled 2019-01-30: qty 0.4

## 2019-01-30 MED ORDER — NICOTINE 21 MG/24HR TD PT24: MEDICATED_PATCH | TRANSDERMAL | 0 refills | Status: DC

## 2019-01-30 NOTE — Progress Notes (Signed)
Pt complained of tingling right arm. Pt stated that she cant grab her fork. Hand grip moderate to right side. Pt calm and laughing in room with family members. VSS; Primary nurse paged and spoke to Dr. Nancy Marus. MD to place orders for xray.

## 2019-01-30 NOTE — Discharge Summary (Signed)
[facility] - [place] [facility]   PATIENT NAME: Terri Holt    MR#:  [id number]  DATE OF BIRTH:  1990/11/29  DATE OF ADMISSION:  01/29/2019 ADMITTING PHYSICIAN: Campbell Stall, MD  DATE OF DISCHARGE: 01/30/2019  PRIMARY CARE PHYSICIAN: Anola Gurney, PA    ADMISSION DIAGNOSIS:  Intentional drug overdose, initial encounter (HCC) [T50.902A]  DISCHARGE DIAGNOSIS:  Principal Problem:   Drug overdose   SECONDARY DIAGNOSIS:   Past Medical History:  Diagnosis Date  . Anxiety   . Constipation   . GERD (gastroesophageal reflux disease)   . Migraines   . Mini stroke [facility])     HOSPITAL COURSE:   1.  Intentional drug overdose, suspected suicide attempt.  Poison control recommended 24 hours of cardiac monitoring.  Patient refusing telemetry monitoring as of this afternoon.  Medically stable to go to the psychiatric floor or to go home depending on psychiatric evaluation. 2.  Polysubstance abuse.  Urine drug toxicology positive for benzodiazepines and amphetamines. 3.  Tobacco abuse.  Requesting nicotine patch.   DISCHARGE CONDITIONS:   Satisfactory  CONSULTS OBTAINED:  Psychiatry  DRUG ALLERGIES:   Allergies  Allergen Reactions  . Amoxicillin Hives and Rash  . Penicillins Hives and Rash    Has patient had a PCN reaction causing immediate rash, facial/tongue/throat swelling, SOB or lightheadedness with hypotension: Yes Has patient had a PCN reaction causing severe rash involving mucus membranes or skin necrosis: Yes Has patient had a PCN reaction that required hospitalization: No Has patient had a PCN reaction occurring within the last 10 years: Yes If all of the above answers are "NO", then may proceed with Cephalosporin use.   . Adhesive [Tape]     Pull skin off.  Both paper tape and tegaderm are ok  . Latex Rash  . Nickel Hives and Rash    DISCHARGE MEDICATIONS:   Allergies as of 01/30/2019      Reactions   Amoxicillin Hives, Rash    Penicillins Hives, Rash   Has patient had a PCN reaction causing immediate rash, facial/tongue/throat swelling, SOB or lightheadedness with hypotension: Yes Has patient had a PCN reaction causing severe rash involving mucus membranes or skin necrosis: Yes Has patient had a PCN reaction that required hospitalization: No Has patient had a PCN reaction occurring within the last 10 years: Yes If all of the above answers are "NO", then may proceed with Cephalosporin use.   Adhesive [tape]    Pull skin off.  Both paper tape and tegaderm are ok   Latex Rash   Nickel Hives, Rash      Medication List    TAKE these medications   nicotine 21 mg/24hr patch Commonly known as:  NICODERM CQ - dosed in mg/24 hours One patch transdermal daily. Okay to substitute genric   prenatal vitamin w/FE, FA 29-1 MG Chew chewable tablet Chew 2 tablets by mouth daily at 12 noon.        DISCHARGE INSTRUCTIONS:   If discharged to the psychiatric floor follow-up with the psychiatry team 1 day If cleared to go home follow-up with PMD in 5 days  If you experience worsening of your admission symptoms, develop shortness of breath, life threatening emergency, suicidal or homicidal thoughts you must seek medical attention immediately by calling 911 or calling your MD immediately  if symptoms less severe.  You Must read complete instructions/literature along with all the possible adverse reactions/side effects for all the Medicines you take and that have been  prescribed to you. Take any new Medicines after you have completely understood and accept all the possible adverse reactions/side effects.   Please note  You were cared for by a hospitalist during your hospital stay. If you have any questions about your discharge medications or the care you received while you were in the hospital after you are discharged, you can call the unit and asked to speak with the hospitalist on call if the hospitalist that took care of you  is not available. Once you are discharged, your primary care physician will handle any further medical issues. Please note that NO REFILLS for any discharge medications will be authorized once you are discharged, as it is imperative that you return to your primary care physician (or establish a relationship with a primary care physician if you do not have one) for your aftercare needs so that they can reassess your need for medications and monitor your lab values.    Today   CHIEF COMPLAINT:   Chief Complaint  Patient presents with  . Drug Overdose    HISTORY OF PRESENT ILLNESS:  Terri Holt  is a 28 y.o. female came in with overdose   VITAL SIGNS:  Blood pressure 120/86, pulse 97, temperature 97.6 F (36.4 C), temperature source Oral, resp. rate 16, height  (1.676 m), weight 68 kg, SpO2 100 %, not currently breastfeeding.    PHYSICAL EXAMINATION:  GENERAL:  28 y.o.-year-old patient lying in the bed with no acute distress.  EYES: Pupils equal, round, reactive to light and accommodation. No scleral icterus. Extraocular muscles intact.  HEENT: Head atraumatic, normocephalic. Oropharynx and nasopharynx clear.  NECK:  Supple, no jugular venous distention. No thyroid enlargement, no tenderness.  LUNGS: Normal breath sounds bilaterally, no wheezing, rales,rhonchi or crepitation. No use of accessory muscles of respiration.  CARDIOVASCULAR: S1, S2 normal. No murmurs, rubs, or gallops.  ABDOMEN: Soft, non-tender, non-distended. Bowel sounds present. No organomegaly or mass.  EXTREMITIES: No pedal edema, cyanosis, or clubbing.  NEUROLOGIC: Cranial nerves II through XII are intact. Muscle strength 5/5 in all extremities. Sensation intact. Gait not checked.  PSYCHIATRIC: The patient is alert and answers questions.  Pressured speech.  SKIN: No obvious rash, lesion, or ulcer.   DATA REVIEW:   CBC Recent Labs  Lab 01/30/19 0606  WBC 13.2*  HGB 17.3*  HCT 53.1*  PLT 223     Chemistries  Recent Labs  Lab 01/29/19 1332 01/30/19 0606  NA 142 143  K 4.9 3.8  CL 110 111  CO2 25 24  GLUCOSE 96 87  BUN 7 12  CREATININE 0.69 0.61  CALCIUM 9.6 9.1  MG 2.0  --   AST 26  --   ALT 16  --   ALKPHOS 67  --   BILITOT 0.9  --       Management plans discussed with the patient.  Awaiting psychiatric evaluation for determination of whether she goes to the psychiatry floor or goes home.  CODE STATUS:     Code Status Orders  (From admission, onward)         Start     Ordered   01/29/19 1910  Full code  Continuous     01/29/19 1909        Code Status History    Date Active Date Inactive Code Status Order ID Comments User Context   02/26/2018 1544 02/28/2018 2055 Full Code [id number]  Hildred Laser, MD Inpatient      TOTAL TIME TAKING CARE  OF THIS PATIENT: 32 minutes.  The patient's nurse Raynelle Fanning was in the room with me the entire interview and exam.   Alford Highland M.D on 01/30/2019 at 2:16 PM  Between 7am to 6pm - Pager - [phone]  After 6pm go to [email or web address] - Social research officer, government  [facility] Office  [phone]  CC: Primary care physician; Anola Gurney, [place]
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
## 2019-01-30 NOTE — Progress Notes (Signed)
Pt complained of burning sensation during urination. Urinalysis is negative and spoke to Dr. Nancy Marus. Encouraged pt to increase fluid intake.

## 2019-01-30 NOTE — Progress Notes (Signed)
Patient ID: Terri Holt, female   DOB: Apr 03, 1991, 28 y.o.   MRN: [id number] Patient seen with Nurse Raynelle Fanning. Medically stable to go to psych floor.  Await psych evaluation. Dr Renae Gloss

## 2019-01-31 ENCOUNTER — Other Ambulatory Visit: Payer: Self-pay

## 2019-01-31 ENCOUNTER — Inpatient Hospital Stay
Admission: AD | Admit: 2019-01-31 | Discharge: 2019-02-02 | DRG: 885 | Disposition: A | Discharge status: Home / Self Care | Payer: No Typology Code available for payment source | Attending: Psychiatry | Admitting: Psychiatry

## 2019-01-31 DIAGNOSIS — Z825 Family history of asthma and other chronic lower respiratory diseases: Secondary | ICD-10-CM

## 2019-01-31 DIAGNOSIS — Z8249 Family history of ischemic heart disease and other diseases of the circulatory system: Secondary | ICD-10-CM | POA: Diagnosis not present

## 2019-01-31 DIAGNOSIS — Z91040 Latex allergy status: Secondary | ICD-10-CM | POA: Diagnosis not present

## 2019-01-31 DIAGNOSIS — Z881 Allergy status to other antibiotic agents status: Secondary | ICD-10-CM

## 2019-01-31 DIAGNOSIS — Z880 Allergy status to penicillin: Secondary | ICD-10-CM | POA: Diagnosis not present

## 2019-01-31 DIAGNOSIS — Z8349 Family history of other endocrine, nutritional and metabolic diseases: Secondary | ICD-10-CM | POA: Diagnosis not present

## 2019-01-31 DIAGNOSIS — Z8673 Personal history of transient ischemic attack (TIA), and cerebral infarction without residual deficits: Secondary | ICD-10-CM | POA: Diagnosis not present

## 2019-01-31 DIAGNOSIS — Z818 Family history of other mental and behavioral disorders: Secondary | ICD-10-CM

## 2019-01-31 DIAGNOSIS — F419 Anxiety disorder, unspecified: Secondary | ICD-10-CM | POA: Diagnosis present

## 2019-01-31 DIAGNOSIS — F1510 Other stimulant abuse, uncomplicated: Secondary | ICD-10-CM | POA: Diagnosis present

## 2019-01-31 DIAGNOSIS — T424X2A Poisoning by benzodiazepines, intentional self-harm, initial encounter: Secondary | ICD-10-CM | POA: Diagnosis not present

## 2019-01-31 DIAGNOSIS — F314 Bipolar disorder, current episode depressed, severe, without psychotic features: Secondary | ICD-10-CM | POA: Diagnosis present

## 2019-01-31 DIAGNOSIS — F3130 Bipolar disorder, current episode depressed, mild or moderate severity, unspecified: Principal | ICD-10-CM | POA: Diagnosis present

## 2019-01-31 DIAGNOSIS — Z91048 Other nonmedicinal substance allergy status: Secondary | ICD-10-CM

## 2019-01-31 DIAGNOSIS — F17210 Nicotine dependence, cigarettes, uncomplicated: Secondary | ICD-10-CM | POA: Diagnosis present

## 2019-01-31 DIAGNOSIS — T1491XA Suicide attempt, initial encounter: Secondary | ICD-10-CM | POA: Diagnosis present

## 2019-01-31 DIAGNOSIS — Z888 Allergy status to other drugs, medicaments and biological substances status: Secondary | ICD-10-CM | POA: Diagnosis not present

## 2019-01-31 DIAGNOSIS — K219 Gastro-esophageal reflux disease without esophagitis: Secondary | ICD-10-CM | POA: Diagnosis present

## 2019-01-31 DIAGNOSIS — T50901A Poisoning by unspecified drugs, medicaments and biological substances, accidental (unintentional), initial encounter: Secondary | ICD-10-CM | POA: Diagnosis present

## 2019-01-31 HISTORY — DX: Depression, unspecified: F32.A

## 2019-01-31 HISTORY — DX: Headache, unspecified: R51.9

## 2019-01-31 HISTORY — DX: Headache: R51

## 2019-01-31 HISTORY — DX: Major depressive disorder, single episode, unspecified: F32.9

## 2019-01-31 HISTORY — DX: Schizophrenia, unspecified: F20.9

## 2019-01-31 LAB — TROPONIN I: Troponin I: <0.03 ng/mL (ref <0.03)

## 2019-01-31 LAB — LIPID PANEL
Cholesterol: 120 mg/dL (ref 0–200)
HDL: 50 mg/dL (ref >40)
LDL CALC: 41 mg/dL (ref 0–99)
Total CHOL/HDL Ratio: 2.4 RATIO
Triglycerides: 147 mg/dL (ref <150)
VLDL: 29 mg/dL (ref 0–40)

## 2019-01-31 LAB — TSH: TSH: 1.135 u[IU]/mL (ref 0.350–4.500)

## 2019-01-31 LAB — HEMOGLOBIN A1C
HEMOGLOBIN A1C: 4.7 % — AB (ref 4.8–5.6)
Mean Plasma Glucose: 88.19 mg/dL

## 2019-01-31 MED ORDER — PANTOPRAZOLE SODIUM 40 MG PO TBEC
40.0000 mg | DELAYED_RELEASE_TABLET | Freq: Every day | ORAL | Status: DC
  Administered 2019-01-31 – 2019-02-02 (×3): 40 mg via ORAL
  Filled 2019-01-31 (×3): qty 1

## 2019-01-31 MED ORDER — NICOTINE 21 MG/24HR TD PT24
21.0000 mg | MEDICATED_PATCH | Freq: Every day | TRANSDERMAL | Status: DC
  Administered 2019-01-31 – 2019-02-02 (×3): 21 mg via TRANSDERMAL

## 2019-01-31 MED ORDER — HYDROXYZINE HCL 50 MG PO TABS: 50.0000 mg | ORAL_TABLET | Freq: Three times a day (TID) | ORAL | Status: DC | PRN

## 2019-01-31 MED ORDER — MAGNESIUM HYDROXIDE 400 MG/5ML PO SUSP: 30.0000 mL | Freq: Every day | ORAL | Status: DC | PRN

## 2019-01-31 MED ORDER — TRAZODONE HCL 100 MG PO TABS: 100.0000 mg | ORAL_TABLET | Freq: Every evening | ORAL | Status: DC | PRN

## 2019-01-31 MED ORDER — SERTRALINE HCL 25 MG PO TABS
50.0000 mg | ORAL_TABLET | Freq: Every day | ORAL | Status: DC
  Administered 2019-01-31 – 2019-02-01 (×2): 50 mg via ORAL
  Filled 2019-01-31 (×2): qty 2

## 2019-01-31 MED ORDER — BUPROPION HCL ER (SR) 100 MG PO TB12
100.0000 mg | ORAL_TABLET | Freq: Every day | ORAL | Status: DC
  Administered 2019-01-31 – 2019-02-02 (×3): 100 mg via ORAL
  Filled 2019-01-31 (×3): qty 1

## 2019-01-31 MED ORDER — ALUM & MAG HYDROXIDE-SIMETH 200-200-20 MG/5ML PO SUSP: 30.0000 mL | ORAL | Status: DC | PRN

## 2019-01-31 NOTE — [facility] Group Notes (Signed)
LCSW Group Therapy Note  01/31/2019 1:15pm  Type of Therapy and Topic:  Group Therapy:  Cognitive Distortions  Participation Level:  Did Not Attend   Description of Group:    Patients in this group will be introduced to the topic of cognitive distortions.  Patients will identify and describe cognitive distortions, describe the feelings these distortions create for them.  Patients will identify one or more situations in their personal life where they have cognitively distorted thinking and will verbalize challenging this cognitive distortion through positive thinking skills.  Patients will practice the skill of using positive affirmations to challenge cognitive distortions using affirmation cards.    Therapeutic Goals:  1. Patient will identify two or more cognitive distortions they have used 2. Patient will identify one or more emotions that stem from use of a cognitive distortion 3. Patient will demonstrate use of a positive affirmation to counter a cognitive distortion through discussion and/or role play. 4. Patient will describe one way cognitive distortions can be detrimental to wellness   Summary of Patient Progress: Pt was invited to attend group but chose not to attend. CSW will continue to encourage pt to attend group throughout their admission.      Therapeutic Modalities:   Cognitive Behavioral Therapy Motivational Interviewing   Sanita Estrada  CUEBAS-COLON, LCSW 01/31/2019 12:02 PM    

## 2019-01-31 NOTE — H&P (Signed)
Psychiatric Admission Assessment Adult  Patient Identification: Terri Holt MRN:  [id number] Date of Evaluation:  01/31/2019 Chief Complaint:  mAJOR dEPRESSION Principal Diagnosis: Bipolar I disorder, most recent episode depressed, severe without psychotic features (HCC) Diagnosis:  Principal Problem:   Bipolar I disorder, most recent episode depressed, severe without psychotic features (HCC) Active Problems:   Drug overdose   Suicide attempt (HCC)   Amphetamine abuse (HCC)  History of Present Illness:   Identifying data. [name] is a 28 year old female with a history of mood instability.  Chief complaint. "I was a mistake."  History f present illness. Informationwasobtained from the patient and techart. The patient was brought to the hospital after found unconscious with carefully written suicidl notes. Overdose on medication and carbon monoxide poisoning are suspected but te patient admits to pils only. She became increasingly restless, anxious and suicidal after her husband left her for her best friend. Reports poor sleep, no appetite, crying. Denies psychotic symptoms. Reports high anxiety. Positive for amphetamies and benzodiazepines. Patient denies curret use.  Past psychiatri history. No hospitalizations or suicide attemts. Treted with success with Zooft and Wellbutrin. Reports periods of mania.  Family psychiatric history. None reported.  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 Livves with wto sons ages 76 and 39 month with hergrndparents. They were out of town when she overdosed.    Total Time spent with patient: 1 hour  Is the patient at risk to self? Yes.    Has the patient been a risk to self in the past 6 months? No.  Has the patient been a risk to self within the distant past? No.  Is the patient a risk to others? No.  Has the patient been a risk to others in the past 6 months? No.  Has  the patient been a risk to others within the distant past? No.   Prior Inpatient Therapy:   Prior Outpatient Therapy:    Alcohol Screening: 1. How often do you have a drink containing alcohol?: Never 2. How many drinks containing alcohol do you have on a typical day when you are drinking?: 1 or 2 3. How often do you have six or more drinks on one occasion?: Never AUDIT-C Score: 0 4. How often during the last year have you found that you were not able to stop drinking once you had started?: Never 5. How often during the last year have you failed to do what was normally expected from you becasue of drinking?: Never 6. How often during the last year have you needed a first drink in the morning to get yourself going after a heavy drinking session?: Never 7. How often during the last year have you had a feeling of guilt of remorse after drinking?: Never 8. How often during the last year have you been unable to remember what happened the night before because you had been drinking?: Never 9. Have you or someone else been injured as a result of your drinking?: No 10. Has a relative or friend or a doctor or another health worker been concerned about your drinking or suggested you cut down?: No Alcohol Use Disorder Identification Test Final Score (AUDIT): 0 Alcohol Brief Interventions/Follow-up: Patient Refused Substance Abuse History in the last 12 months:  Yes.   Consequences of Substance Abuse: Negative Previous Psychotropic Medications: Yes  Psychological Evaluations: No  Past Medical History:  Past Medical History:  Diagnosis  Date  . Anxiety   . Constipation   . Depression   . Headache   . Mini stroke (HCC)   . Schizophrenia [facility])     Past Surgical History:  Procedure Laterality Date  . CESAREAN SECTION    . CESAREAN SECTION N/A 02/26/2018   Procedure: REPEAT CESAREAN SECTION;  Surgeon: Hildred Laser, MD;  Location: [facility] ORS;  Service: Obstetrics;  Laterality: N/A;  . WISDOM TOOTH  EXTRACTION     Family History:  Family History  Problem Relation Age of Onset  . Migraines Mother   . Asthma Brother   . Hyperlipidemia Brother   . Asthma Maternal Grandmother   . Hyperlipidemia Maternal Grandmother   . Heart failure Maternal Grandfather   . Hyperlipidemia Maternal Grandfather   . Heart failure Paternal Grandmother   . Cancer Paternal Grandmother        tumor  . Asthma Son   . Thyroid disease Maternal Aunt   . Cancer Maternal Aunt        melanoma    Tobacco Screening: Have you used any form of tobacco in the last 30 days? (Cigarettes, Smokeless Tobacco, Cigars, and/or Pipes): Yes Tobacco use, Select all that apply: 5 or more cigarettes per day Are you interested in Tobacco Cessation Medications?: Yes, will notify MD for an order Counseled patient on smoking cessation including recognizing danger situations, developing coping skills and basic information about quitting provided: Yes Social History:  Social History   Substance and Sexual Activity  Alcohol Use No  . Alcohol/week: 0.0 standard drinks   Comment: wine at night, occasionally     Social History   Substance and Sexual Activity  Drug Use Not Currently  . Types: Amphetamines    Additional Social History:      History of alcohol / drug use?: No history of alcohol / drug abuse Negative Consequences of Use: Personal relationships Withdrawal Symptoms: Fever / Chills, Nausea / Vomiting                    Allergies:   Allergies  Allergen Reactions  . Amoxicillin Hives and Rash  . Penicillins Hives and Rash    Has patient had a PCN reaction causing immediate rash, facial/tongue/throat swelling, SOB or lightheadedness with hypotension: Yes Has patient had a PCN reaction causing severe rash involving mucus membranes or skin necrosis: Yes Has patient had a PCN reaction that required hospitalization: No Has patient had a PCN reaction occurring within the last 10 years: Yes If all of the  above answers are "NO", then may proceed with Cephalosporin use.   . Adhesive [Tape]     Pull skin off.  Both paper tape and tegaderm are ok  . Latex Rash  . Nickel Hives and Rash   Lab Results:  Results for orders placed or performed during the hospital encounter of 01/31/19 (from the past 48 hour(s))  Hemoglobin A1c     Status: Abnormal   Collection Time: 01/31/19  6:29 AM  Result Value Ref Range   Hgb A1c MFr Bld 4.7 (L) 4.8 - 5.6 %    Comment: (NOTE) Pre diabetes:          5.7%-6.4% Diabetes:              >6.4% Glycemic control for   <7.0% adults with diabetes    Mean Plasma Glucose 88.19 mg/dL    Comment: Performed at [facility], [address], [place], [place] [address]  Lipid panel  Status: None   Collection Time: 01/31/19  6:29 AM  Result Value Ref Range   Cholesterol 120 0 - 200 mg/dL   Triglycerides 161 <096 mg/dL   HDL 50 >04 mg/dL   Total CHOL/HDL Ratio 2.4 RATIO   VLDL 29 0 - 40 mg/dL   LDL Cholesterol 41 0 - 99 mg/dL    Comment:        Total Cholesterol/HDL:CHD Risk Coronary Heart Disease Risk Table                     Men   Women  1/2 Average Risk   3.4   3.3  Average Risk       5.0   4.4  2 X Average Risk   9.6   7.1  3 X Average Risk  23.4   11.0        Use the calculated Patient Ratio above and the CHD Risk Table to determine the patient's CHD Risk.        ATP III CLASSIFICATION (LDL):  <100     mg/dL   Optimal  540-981  mg/dL   Near or Above                    Optimal  130-159  mg/dL   Borderline  191-478  mg/dL   High  >295     mg/dL   Very High Performed at [facility], [address] [place], [place] [address]   TSH     Status: None   Collection Time: 01/31/19  6:29 AM  Result Value Ref Range   TSH 1.135 0.350 - 4.500 uIU/mL    Comment: Performed by a 3rd Generation assay with a functional sensitivity of <=0.01 uIU/mL. Performed at [facility], [address] [place], [place] [address]     Blood  Alcohol level:  Lab Results  Component Value Date   [facility] <10 01/29/2019    Metabolic Disorder Labs:  Lab Results  Component Value Date   HGBA1C 4.7 (L) 01/31/2019   MPG 88.19 01/31/2019   No results found for: PROLACTIN Lab Results  Component Value Date   CHOL 120 01/31/2019   TRIG 147 01/31/2019   HDL 50 01/31/2019   CHOLHDL 2.4 01/31/2019   VLDL 29 01/31/2019   LDLCALC 41 01/31/2019    Current Medications: Current Facility-Administered Medications  Medication Dose Route Frequency Provider Last Rate Last Dose  . alum & mag hydroxide-simeth (MAALOX/MYLANTA) 200-200-20 MG/5ML suspension 30 mL  30 mL Oral Q4H PRN Clapacs, John T, MD      . buPROPion [facility] SR) 12 hr tablet 100 mg  100 mg Oral Daily Mahira Petras B, MD   100 mg at 01/31/19 1643  . hydrOXYzine (ATARAX/VISTARIL) tablet 50 mg  50 mg Oral TID PRN Clapacs, John T, MD      . magnesium hydroxide (MILK OF MAGNESIA) suspension 30 mL  30 mL Oral Daily PRN Clapacs, John T, MD      . nicotine (NICODERM CQ - dosed in mg/24 hours) patch 21 mg  21 mg Transdermal Daily Clapacs, John T, MD   21 mg at 01/31/19 1031  . pantoprazole (PROTONIX) EC tablet 40 mg  40 mg Oral Daily Rani Sisney B, MD   40 mg at 01/31/19 1643  . sertraline (ZOLOFT) tablet 50 mg  50 mg Oral Daily Million Maharaj B, MD   50 mg at 01/31/19 1643  . traZODone (DESYREL) tablet 100  mg  100 mg Oral QHS PRN Clapacs, Jackquline Denmark, MD       PTA Medications: Medications Prior to Admission  Medication Sig Dispense Refill Last Dose  . nicotine (NICODERM CQ - DOSED IN MG/24 HOURS) 21 mg/24hr patch One patch transdermal daily. Okay to substitute genric 28 patch 0   . prenatal vitamin w/FE, FA (NATACHEW) 29-1 MG CHEW chewable tablet Chew 2 tablets by mouth daily at 12 noon.    Not Taking at Unknown time    Musculoskeletal: Strength & Muscle Tone: within normal limits Gait & Station: normal Patient leans: Backward  Psychiatric Specialty Exam: I  reviewed physical exam erformed on medical floor and agree witte findings. Physical Exam  Nursing note and vitals reviewed. Psychiatric: Her speech is normal. Thought content normal. Her mood appears anxious. She is withdrawn. Cognition and memory are normal. She expresses impulsivity. She exhibits a depressed mood.    Review of Systems  Gastrointestinal: Positive for abdominal pain.  Neurological: Negative.   Psychiatric/Behavioral: Positive for depression and substance abuse. The patient has insomnia.   All other systems reviewed and are negative.   Blood pressure 109/72, pulse 88, temperature 98.3 F (36.8 C), temperature source Oral, resp. rate 18, height  (1.676 m), weight 72.6 kg, SpO2 98 %, not currently breastfeeding.Body mass index is 25.82 kg/m.  See SRA                                                  Sleep:       Treatment Plan Summary: Daily contact with patient to assess and evaluate symptoms and progress in treatment and Medication management   Ms. Emory s a 28 year old female with a history suggestive of bipolar disorder admitted after serious suicida attempt involving  Carbon monoxide poisoning, overdose and suicidal notes in the context of spousal infidelity.  #Suicda ideation -adamantly denies any thoughts, intention or plans to hurt herself r others  #Mood -responded well efore to combination of Zoloft and Wellbutrin -wil sart Lamictal 25 mg for mood stabilization -Trazodone 100 mg nightly  #GERD Protonix 40 mg daily  #Substance abuse -denies any and minimizes problems  #Disposition -discharge to home with family -follow up with [facility]   Observation Level/Precautions:  15 minute checks  Laboratory:  CBC Chemistry Profile UDS UA  Psychotherapy:    Medications:    Consultations:    Discharge Concerns:    Estimated LOS:  Other:     Physician Treatment Plan for Primary Diagnosis: Bipolar I disorder, most recent episode  depressed, severe without psychotic features (HCC) Long Term Goal(s): Improvement in symptoms so as ready for discharge  Short Term Goals: Ability to identify changes in lifestyle to reduce recurrence of condition will improve, Ability to verbalize feelings will improve, Ability to disclose and discuss suicidal ideas, Ability to demonstrate self-control will improve, Ability to identify and develop effective coping behaviors will improve, Ability to maintain clinical measurements within normal limits will improve, Compliance with prescribed medications will improve and Ability to identify triggers associated with substance abuse/mental health issues will improve  Physician Treatment Plan for Secondary Diagnosis: Principal Problem:   Bipolar I disorder, most recent episode depressed, severe without psychotic features (HCC) Active Problems:   Drug overdose   Suicide attempt (HCC)   Amphetamine abuse (HCC)  Long Term Goal(s): Improvement in symptoms so as  ready for discharge  Short Term Goals: Ability to identify changes in lifestyle to reduce recurrence of condition will improve, Ability to demonstrate self-control will improve and Ability to identify triggers associated with substance abuse/mental health issues will improve  I certify that inpatient services furnished can reasonably be expected to improve the patient's condition.    Kristine Linea, MD 3/7/20208:17 PM

## 2019-01-31 NOTE — Progress Notes (Signed)
Pt discharged to [facility] via wheelchair with police excort. Personal items given to care tech. Report given to [place], Charity fundraiser.

## 2019-01-31 NOTE — Progress Notes (Signed)
Patient ID: Terri Holt, female   DOB: [birth year], 28 y.o.   MRN: [id number]   D: Pt has been very flat and depressed on the unit today. Pt slept most of the morning, she get up at lunch. Pt reported that she was not having a good day and that she could not rate her anxiety, depression, or hopelessness at this time. Pt reported that she just had her kids on her mind. Pt reported no other issues or concerns, she has just been very guarded and isolative at times. Pt reported being negative SI/HI, no AH/VH noted. A: 15 min checks continued for patient safety. R: Pt safety maintained.

## 2019-01-31 NOTE — Plan of Care (Signed)
Patient received on unit after discharge from medical unit. She arrives s/p suicide attempt in the form of drug overdose and a suicide note. Vital signs stable but has numerous allergies: Amoxicillin, Penicillins, latex, Nickel, Adhesive tape. Skin assessment shows intact, nontender, without drainage, swelling nor bleeding. She is cooperative and pleasant though quite drowsy. She is encouraged to notify staff of concerns/problems. She is safe and will be monitored every 15 minutes. Her belongings arrived with her when transferred from medical unit.

## 2019-01-31 NOTE — Progress Notes (Signed)
D- Patient received on unit via wheelchair. Able to participate in admission process but was very drowsy and sad faced. She reports being very depressed . Support and encouragement provided.  Routine safety checks conducted every 15 minutes.  Patient informed to notify staff with problems or concerns. R- No medications provided this evening since she has multiple allergies. Will continue to monitor and support.

## 2019-01-31 NOTE — [facility] Suicide Risk Assessment (Signed)
[facility] Admission Suicide Risk Assessment   Nursing information obtained from:  Patient Demographic factors:  Caucasian, Unemployed Current Mental Status:  Self-harm thoughts, Self-harm behaviors Loss Factors:  Loss of significant relationship Historical Factors:  Family history of suicide Risk Reduction Factors:  Responsible for children under 30 years of age, Living with another person, especially a relative  Total Time spent with patient: 1 hour Principal Problem: Bipolar I disorder, most recent episode depressed, severe without psychotic features (HCC) Diagnosis:  Principal Problem:   Bipolar I disorder, most recent episode depressed, severe without psychotic features (HCC) Active Problems:   Drug overdose   Suicide attempt (HCC)   Amphetamine abuse (HCC)  Subjective Data: overdose  Continued Clinical Symptoms:  Alcohol Use Disorder Identification Test Final Score (AUDIT): 0 The "Alcohol Use Disorders Identification Test", Guidelines for Use in Primary Care, Second Edition.  World Science writer [facility]). Score between 0-7:  no or low risk or alcohol related problems. Score between 8-15:  moderate risk of alcohol related problems. Score between 16-19:  high risk of alcohol related problems. Score 20 or above:  warrants further diagnostic evaluation for alcohol dependence and treatment.   CLINICAL FACTORS:   Bipolar Disorder:   Depressive phase Depression:   Comorbid alcohol abuse/dependence Impulsivity Insomnia Alcohol/Substance Abuse/Dependencies   Musculoskeletal: Strength & Muscle Tone: within normal limits Gait & Station: normal Patient leans: N/A  Psychiatric Specialty Exam: Physical Exam  Nursing note and vitals reviewed. Psychiatric: Her speech is normal. Thought content normal. Her mood appears anxious. She is withdrawn. Cognition and memory are normal. She expresses impulsivity. She exhibits a depressed mood.    Review of Systems  Neurological: Negative.    Psychiatric/Behavioral: Positive for depression and substance abuse. The patient is nervous/anxious.   All other systems reviewed and are negative.   Blood pressure 109/72, pulse 88, temperature 98.3 F (36.8 C), temperature source Oral, resp. rate 18, height 5\' 6"  (1.676 m), weight 72.6 kg, SpO2 98 %, not currently breastfeeding.Body mass index is 25.82 kg/m.  General Appearance: Disheveled  Eye Contact:  Good  Speech:  Clear and Coherent  Volume:  Normal  Mood:  Anxious and Depressed  Affect:  Depressed  Thought Process:  Goal Directed and Descriptions of Associations: Intact  Orientation:  Full (Time, Place, and Person)  Thought Content:  WDL  Suicidal Thoughts:  No  Homicidal Thoughts:  No  Memory:  Immediate;   Fair Recent;   Fair Remote;   Fair  Judgement:  Poor  Insight:  Lacking  Psychomotor Activity:  Psychomotor Retardation  Concentration:  Concentration: Fair and Attention Span: Fair  Recall:  [employer] of Knowledge:  Fair  Language:  Fair  Akathisia:  No  Handed:  Right  AIMS (if indicated):     Assets:  Communication Skills Desire for Improvement Housing Physical Health Resilience Social Support Talents/Skills Transportation  ADL's:  Intact  Cognition:  WNL  Sleep:         COGNITIVE FEATURES THAT CONTRIBUTE TO RISK:  None    SUICIDE RISK:   Moderate:  Frequent suicidal ideation with limited intensity, and duration, some specificity in terms of plans, no associated intent, good self-control, limited dysphoria/symptomatology, some risk factors present, and identifiable protective factors, including available and accessible social support.  PLAN OF CARE: hospital admission, medication management, substance abuse counseling, dischare planning.  [name] s a 28 year old female with a history suggestive of bipolar disorder admitted after serious suicida attempt involving  Carbon monoxide poisoning, overdose  and suicidal notes in the context of spousal  infidelity.  #Suicda ideation -adamantly denies any thoughts, intention or plans to hurt herself r others  #Moo -responded well efore to combination of Zoloft and Wellbutrin -wil sart Lamictal 25 mg for mood stabilization  #Substance abuse -denies any and minimizes problems  #Disposition -discharge to home with family -follow up with [facility] -  I certify that inpatient services furnished can reasonably be expected to improve the patient's condition.   Kristine Linea, MD 01/31/2019, 8:01 PM

## 2019-01-31 NOTE — Progress Notes (Signed)
D- Patient alert and oriented. Very soft spoken and  sad faced. Currently denies SI/HI,AVH.. Goal.: wants to recover without complications, talk with psychiatrist. A-. Support and encouragement provided.  Routine safety checks conducted every 15 minutes.  Patient informed to notify staff with problems or concerns. R- Patient does not have medications scheduled for this evening. Will continue to monitor and notify MD of any untoward events.

## 2019-02-01 MED ORDER — CARBAMAZEPINE 200 MG PO TABS
200.0000 mg | ORAL_TABLET | Freq: Two times a day (BID) | ORAL | Status: DC
  Administered 2019-02-01 – 2019-02-02 (×2): 200 mg via ORAL
  Filled 2019-02-01 (×2): qty 1

## 2019-02-01 MED ORDER — TRAZODONE HCL 100 MG PO TABS: 100.0000 mg | ORAL_TABLET | Freq: Every evening | ORAL | 1 refills | Status: DC | PRN

## 2019-02-01 MED ORDER — HYDROXYZINE HCL 50 MG PO TABS: 50.0000 mg | ORAL_TABLET | Freq: Three times a day (TID) | ORAL | 1 refills | Status: DC | PRN

## 2019-02-01 MED ORDER — PANTOPRAZOLE SODIUM 40 MG PO TBEC: 40.0000 mg | DELAYED_RELEASE_TABLET | Freq: Every day | ORAL | 1 refills | Status: DC

## 2019-02-01 MED ORDER — SERTRALINE HCL 100 MG PO TABS
100.0000 mg | ORAL_TABLET | Freq: Every day | ORAL | Status: DC
  Administered 2019-02-02: 100 mg via ORAL
  Filled 2019-02-01: qty 1

## 2019-02-01 MED ORDER — BUPROPION HCL ER (SR) 100 MG PO TB12: 100.0000 mg | ORAL_TABLET | Freq: Every day | ORAL | 1 refills | Status: DC

## 2019-02-01 MED ORDER — SERTRALINE HCL 100 MG PO TABS: 100.0000 mg | ORAL_TABLET | Freq: Every day | ORAL | 1 refills | Status: DC

## 2019-02-01 MED ORDER — CARBAMAZEPINE 200 MG PO TABS: 200.0000 mg | ORAL_TABLET | Freq: Two times a day (BID) | ORAL | 1 refills | Status: DC

## 2019-02-01 NOTE — Progress Notes (Signed)
Patient is visibly  seeing in the day room with visitors interacting, mood is bright and affect is congruent with mood, states appetite is good and voice no concerns, denies any SI/HI/AVH, appear to be responding well in the unit, speech is appropriate , contract for safety of self and others , sleep is restful with out distress , 15 minutes safety checks is maintained at this time.

## 2019-02-01 NOTE — Progress Notes (Signed)
[facility] MD Progress Note  02/01/2019 7:58 PM Terri Holt  MRN:  [id number]  Subjective:    Terri Holt feels much better today. Still depressed but no longer suicidal. She has tremendous support from her family and friend with multiple visitors and phone calls. She fees "blessed". She was restarted on Zoloft and Wellbutrin that were helpful in the past. We added Tegretol today to address mood instability.  Principal Problem: Bipolar I disorder, most recent episode depressed, severe without psychotic features (HCC) Diagnosis: Principal Problem:   Bipolar I disorder, most recent episode depressed, severe without psychotic features (HCC) Active Problems:   Drug overdose   Suicide attempt (HCC)   Amphetamine abuse (HCC)  Total Time spent with patient: 20 minutes  Past Psychiatric History: depression  Past Medical History:  Past Medical History:  Diagnosis Date  . Anxiety   . Constipation   . Depression   . Headache   . Mini stroke (HCC)   . Schizophrenia [facility])     Past Surgical History:  Procedure Laterality Date  . CESAREAN SECTION    . CESAREAN SECTION N/A 02/26/2018   Procedure: REPEAT CESAREAN SECTION;  Surgeon: Hildred Laser, MD;  Location: [facility] ORS;  Service: Obstetrics;  Laterality: N/A;  . WISDOM TOOTH EXTRACTION     Family History:  Family History  Problem Relation Age of Onset  . Migraines Mother   . Asthma Brother   . Hyperlipidemia Brother   . Asthma Maternal Grandmother   . Hyperlipidemia Maternal Grandmother   . Heart failure Maternal Grandfather   . Hyperlipidemia Maternal Grandfather   . Heart failure Paternal Grandmother   . Cancer Paternal Grandmother        tumor  . Asthma Son   . Thyroid disease Maternal Aunt   . Cancer Maternal Aunt        melanoma   Family Psychiatric  History: depression Social History:  Social History   Substance and Sexual Activity  Alcohol Use No  . Alcohol/week: 0.0 standard drinks   Comment: wine at night,  occasionally     Social History   Substance and Sexual Activity  Drug Use Not Currently  . Types: Amphetamines    Social History   Socioeconomic History  . Marital status: Married    Spouse name: Not on file  . Number of children: Not on file  . Years of education: Not on file  . Highest education level: Not on file  Occupational History  . Not on file  Social Needs  . Financial resource strain: Not on file  . Food insecurity:    Worry: Not on file    Inability: Not on file  . Transportation needs:    Medical: Not on file    Non-medical: Not on file  Tobacco Use  . Smoking status: Former Smoker    Packs/day: 0.50    Years: 4.00    Pack years: 2.00    Types: Cigarettes    Last attempt to quit: 06/26/2017    Years since quitting: 1.6  . Smokeless tobacco: Never Used  Substance and Sexual Activity  . Alcohol use: No    Alcohol/week: 0.0 standard drinks    Comment: wine at night, occasionally  . Drug use: Not Currently    Types: Amphetamines  . Sexual activity: Yes    Partners: Male    Birth control/protection: None, Surgical    Comment: Pregnant   Lifestyle  . Physical activity:    Days per week: Not on  file    Minutes per session: Not on file  . Stress: Not on file  Relationships  . Social connections:    Talks on phone: Not on file    Gets together: Not on file    Attends religious service: Not on file    Active member of club or organization: Not on file    Attends meetings of clubs or organizations: Not on file    Relationship status: Not on file  Other Topics Concern  . Not on file  Social History Narrative  . Not on file   Additional Social History:    History of alcohol / drug use?: No history of alcohol / drug abuse Negative Consequences of Use: Personal relationships Withdrawal Symptoms: Fever / Chills, Nausea / Vomiting                    Sleep: Fair  Appetite:  Fair  Current Medications: Current Facility-Administered Medications   Medication Dose Route Frequency Provider Last Rate Last Dose  . alum & mag hydroxide-simeth (MAALOX/MYLANTA) 200-200-20 MG/5ML suspension 30 mL  30 mL Oral Q4H PRN Clapacs, John T, MD      . buPROPion [facility] SR) 12 hr tablet 100 mg  100 mg Oral Daily Kayla Deshaies B, MD   100 mg at 02/01/19 0829  . carbamazepine (TEGRETOL) tablet 200 mg  200 mg Oral BID AC & HS Haila Dena B, MD      . hydrOXYzine (ATARAX/VISTARIL) tablet 50 mg  50 mg Oral TID PRN Clapacs, John T, MD      . magnesium hydroxide (MILK OF MAGNESIA) suspension 30 mL  30 mL Oral Daily PRN Clapacs, John T, MD      . nicotine (NICODERM CQ - dosed in mg/24 hours) patch 21 mg  21 mg Transdermal Daily Clapacs, John T, MD   21 mg at 02/01/19 0830  . pantoprazole (PROTONIX) EC tablet 40 mg  40 mg Oral Daily Kamdyn Colborn B, MD   40 mg at 02/01/19 0830  . [START ON 02/02/2019] sertraline (ZOLOFT) tablet 100 mg  100 mg Oral Daily Joshua Soulier B, MD      . traZODone (DESYREL) tablet 100 mg  100 mg Oral QHS PRN Clapacs, Jackquline Denmark, MD        Lab Results:  Results for orders placed or performed during the hospital encounter of 01/31/19 (from the past 48 hour(s))  Hemoglobin A1c     Status: Abnormal   Collection Time: 01/31/19  6:29 AM  Result Value Ref Range   Hgb A1c MFr Bld 4.7 (L) 4.8 - 5.6 %    Comment: (NOTE) Pre diabetes:          5.7%-6.4% Diabetes:              >6.4% Glycemic control for   <7.0% adults with diabetes    Mean Plasma Glucose 88.19 mg/dL    Comment: Performed at [facility], [address] [place], [place] [address]  Lipid panel     Status: None   Collection Time: 01/31/19  6:29 AM  Result Value Ref Range   Cholesterol 120 0 - 200 mg/dL   Triglycerides 854 <627 mg/dL   HDL 50 >03 mg/dL   Total CHOL/HDL Ratio 2.4 RATIO   VLDL 29 0 - 40 mg/dL   LDL Cholesterol 41 0 - 99 mg/dL    Comment:        Total Cholesterol/HDL:CHD Risk Coronary Heart Disease Risk Table  Men   Women  1/2 Average Risk   3.4   3.3  Average Risk       5.0   4.4  2 X Average Risk   9.6   7.1  3 X Average Risk  23.4   11.0        Use the calculated Patient Ratio above and the CHD Risk Table to determine the patient's CHD Risk.        ATP III CLASSIFICATION (LDL):  <100     mg/dL   Optimal  277-824  mg/dL   Near or Above                    Optimal  130-159  mg/dL   Borderline  235-361  mg/dL   High  >443     mg/dL   Very High Performed at [facility], [address] [place], [place] [address]   TSH     Status: None   Collection Time: 01/31/19  6:29 AM  Result Value Ref Range   TSH 1.135 0.350 - 4.500 uIU/mL    Comment: Performed by a 3rd Generation assay with a functional sensitivity of <=0.01 uIU/mL. Performed at [facility], [address] [place], [place] [address]     Blood Alcohol level:  Lab Results  Component Value Date   [facility] <10 01/29/2019    Metabolic Disorder Labs: Lab Results  Component Value Date   HGBA1C 4.7 (L) 01/31/2019   MPG 88.19 01/31/2019   No results found for: PROLACTIN Lab Results  Component Value Date   CHOL 120 01/31/2019   TRIG 147 01/31/2019   HDL 50 01/31/2019   CHOLHDL 2.4 01/31/2019   VLDL 29 01/31/2019   LDLCALC 41 01/31/2019    Physical Findings: AIMS: Facial and Oral Movements Muscles of Facial Expression: None, normal Lips and Perioral Area: None, normal Jaw: None, normal Tongue: None, normal,Extremity Movements Upper (arms, wrists, hands, fingers): None, normal Lower (legs, knees, ankles, toes): None, normal, Trunk Movements Neck, shoulders, hips: None, normal, Overall Severity Severity of abnormal movements (highest score from questions above): None, normal Incapacitation due to abnormal movements: None, normal Patient's awareness of abnormal movements (rate only patient's report): No Awareness, Dental Status Current problems with teeth and/or dentures?: No Does patient usually  wear dentures?: No  CIWA:  CIWA-Ar Total: 0 COWS:     Musculoskeletal: Strength & Muscle Tone: within normal limits Gait & Station: normal Patient leans: N/A  Psychiatric Specialty Exam: Physical Exam  Nursing note and vitals reviewed. Psychiatric: Her speech is normal. Thought content normal. She is withdrawn. Cognition and memory are normal. She expresses impulsivity. She exhibits a depressed mood.    Review of Systems  Neurological: Negative.   Psychiatric/Behavioral: Positive for depression.  All other systems reviewed and are negative.   Blood pressure 116/85, pulse 81, temperature 97.9 F (36.6 C), temperature source Oral, resp. rate 18, height 5\' 6"  (1.676 m), weight 72.6 kg, SpO2 100 %, not currently breastfeeding.Body mass index is 25.82 kg/m.  General Appearance: Casual  Eye Contact:  Good  Speech:  Clear and Coherent  Volume:  Decreased  Mood:  Depressed  Affect:  Appropriate  Thought Process:  Goal Directed and Descriptions of Associations: Intact  Orientation:  Full (Time, Place, and Person)  Thought Content:  WDL  Suicidal Thoughts:  No  Homicidal Thoughts:  No  Memory:  Immediate;   Fair Recent;   Fair Remote;   Fair  Judgement:  Impaired  Insight:  Present  Psychomotor Activity:  Normal  Concentration:  Concentration: Fair and Attention Span: Fair  Recall:  [employer] of Knowledge:  Fair  Language:  Fair  Akathisia:  No  Handed:  Right  AIMS (if indicated):     Assets:  Communication Skills Desire for Improvement Housing Physical Health Resilience Social Support Transportation Vocational/Educational  ADL's:  Intact  Cognition:  WNL  Sleep:  Number of Hours: 6     Treatment Plan Summary: Daily contact with patient to assess and evaluate symptoms and progress in treatment and Medication management   Terri Holt s a 28 year old female with a history suggestive of bipolar disorder admitted after serious suicida attempt involving  Carbon  monoxide poisoning, overdose and suicidal notes in the context of spousal infidelity.  #Suicda ideation, resolved -adamantly denies any thoughts, intention or plans to hurt herself r others  #Mood -increase Zoloft to 100 mg daily -Wellbutrin 100 mg daily -start Tegretol 200 mg BID for mood stabilization -Trazodone 100 mg nightly  #GERD Protonix 40 mg daily  #Substance abuse -denies any and minimizes problems  #Disposition -discharge to home with family -follow up with Loletta Parish, MD 02/01/2019, 7:58 PM

## 2019-02-01 NOTE — BHH Group Notes (Signed)
LCSW Group Therapy Note 02/01/2019 1:15pm  Type of Therapy and Topic: Group Therapy: Feelings Around Returning Home & Establishing a Supportive Framework and Supporting Oneself When Supports Not Available  Participation Level: Active  Description of Group:  Patients first processed thoughts and feelings about upcoming discharge. These included fears of upcoming changes, lack of change, new living environments, judgements and expectations from others and overall stigma of mental health issues. The group then discussed the definition of a supportive framework, what that looks and feels like, and how do to discern it from an unhealthy non-supportive network. The group identified different types of supports as well as what to do when your family/friends are less than helpful or unavailable  Therapeutic Goals  1. Patient will identify one healthy supportive network that they can use at discharge. 2. Patient will identify one factor of a supportive framework and how to tell it from an unhealthy network. 3. Patient able to identify one coping skill to use when they do not have positive supports from others. 4. Patient will demonstrate ability to communicate their needs through discussion and/or role plays.  Summary of Patient Progress:  The patient reported she feels "anxious." Pt engaged during group session. As patients processed their anxiety about discharge and described healthy supports patient shared she is ready to be discharge.  Patients identified at least one self-care tool they were willing to use after discharge.   Therapeutic Modalities Cognitive Behavioral Therapy Motivational Interviewing   [place]  CUEBAS-COLON, LCSW 02/01/2019 9:42 AM

## 2019-02-01 NOTE — Plan of Care (Signed)
Data: Patient is appropriate and cooperative to assessment. Patient denies SI/HI/AVH. Patient has completed daily self inventory worksheet. Patient reports depression. Patient reports good sleep quality, appetite is fair for the last 24 hours. Patient rates depression "6/10" , feelings of hopelessness "2/10" and anxiety "8/10" Patients goal for today is "a new daily routine." Patient is minimal during assessment.   Action:  Q x 15 minute observation checks were completed for safety. Patient was provided with education on medications. Patient was offered support and encouragement. Patient was given scheduled medications. Patient  was encourage to attend groups, participate in unit activities and continue with plan of care.    Response: Patient is adherent with scheduled medication. Patient has no complaints at this time. Patient is receptive to treatment and safety maintained on unit.    Problem: Education: Goal: Knowledge of [place] [facility] Education information/materials will improve Outcome: Progressing   Problem: Self-Concept: Goal: Ability to disclose and discuss suicidal ideas will improve Outcome: Progressing   Problem: Education: Goal: Mental status will improve Outcome: Not Progressing

## 2019-02-01 NOTE — BHH Counselor (Signed)
Adult Comprehensive Assessment  Patient ID: Terri Holt, female   DOB: May 15, 1991, 28 y.o.   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: "my husband left me for my best friend" Are you sexually active?: No What is your sexual orientation?: hetersosexual Has your sexual activity been affected by drugs, alcohol, medication, or emotional stress?: no Does patient have children?: No  Childhood History:  By whom was/is the patient raised?: Both parents Description of patient's relationship with caregiver when they were a child: good Patient's description of current relationship with people who raised him/her: good How were you disciplined when you got in trouble as a child/adolescent?: "really strict parents" Does patient have siblings?: Yes Number of Siblings: 1 Description of patient's current relationship with siblings: pt reports she has one brther and the y get  along well Did patient suffer any verbal/emotional/physical/sexual abuse as a child?: No Did patient suffer from severe childhood neglect?: No Has patient ever been sexually abused/assaulted/raped as an adolescent or adult?: No Was the patient ever a victim of a crime or a disaster?: No Witnessed domestic violence?: No Has patient been effected by domestic violence as an adult?: No  Education:  Highest grade of school patient has completed: college degree Currently a student?: No Learning disability?: No  Employment/Work Situation:   Employment situation: (P) Employed Where is patient currently employed?: [employer] long has patient been employed?: a few days Patient's job has been impacted by current illness: No What is the longest time patient has a held a job?: 3 years Where was the patient employed at that time?: [employer] Did You Receive Any Psychiatric Treatment/Services While in the Military?: No Are There Guns or Other Weapons in Your Home?: No  Financial Resources:   Financial resources: Income from employment Does patient have a representative payee or guardian?: (P) No  Alcohol/Substance Abuse:   What has been your use of drugs/alcohol within the last 12 months?: pt denies use If attempted suicide, did drugs/alcohol play a role in this?: No Alcohol/Substance Abuse Treatment Hx: Denies past history Has alcohol/substance abuse ever caused legal problems?: Yes(DUI)  Social Support System:   Patient's Community Support System: Good Describe Community Support System: family Type of faith/religion: Ephriam Knuckles How does patient's faith help to cope with current illness?: "I have a hard time understanding what He is doing right now"  Leisure/Recreation:   Leisure and Hobbies: paint  Strengths/Needs:   What is the patient's perception of their strengths?: paint, math, good with people Patient states these barriers may affect/interfere with their treatment:  no Patient states these barriers may affect their return to the community: no  Discharge  Plan:   Currently receiving community mental health services: No Patient states concerns and preferences for aftercare planning are: TBD with CSW Does patient have access to transportation?: Yes Does patient have financial barriers related to discharge medications?: No Will patient be returning to same living situation after discharge?: Yes  Summary/Recommendations:  Patient is a 28 year old female admitted involuntarily and diagnosed with Bipolar I disorder, most recent episode depressed, severe without psychotic features.  The patient was brought to the hospital after found unconscious with carefully written suicidal notes. Reports poor sleep, no appetite, crying. Denies psychotic symptoms. Reports high anxiety. Patient recently separated from husband. Patient denies current use. Patient will benefit from crisis stabilization, medication evaluation, group therapy and psychoeducation. In addition to case management for discharge planning. At discharge it is recommended that patient adhere to the established discharge plan and continue treatment.     Al Gagen  CUEBAS-COLON. 02/01/2019

## 2019-02-01 NOTE — BHH Suicide Risk Assessment (Signed)
[facility] Discharge Suicide Risk Assessment   Principal Problem: Bipolar I disorder, most recent episode depressed, severe without psychotic features [facility]) Discharge Diagnoses: Principal Problem:   Bipolar I disorder, most recent episode depressed, severe without psychotic features (HCC) Active Problems:   Drug overdose   Suicide attempt (HCC)   Amphetamine abuse (HCC)   Total Time spent with patient: 20 minutes  Musculoskeletal: Strength & Muscle Tone: within normal limits Gait & Station: normal Patient leans: N/A  Psychiatric Specialty Exam: Review of Systems  Neurological: Negative.   Psychiatric/Behavioral: Positive for depression.  All other systems reviewed and are negative.   Blood pressure 123/82, pulse 64, temperature 97.8 F (36.6 C), temperature source Oral, resp. rate 18, height 5\' 6"  (1.676 m), weight 72.6 kg, SpO2 99 %, not currently breastfeeding.Body mass index is 25.82 kg/m.  General Appearance: Casual  Eye Contact::  Good  Speech:  Clear and Coherent409  Volume:  Normal  Mood:  Depressed  Affect:  Appropriate  Thought Process:  Goal Directed and Descriptions of Associations: Intact  Orientation:  Full (Time, Place, and Person)  Thought Content:  WDL  Suicidal Thoughts:  No  Homicidal Thoughts:  No  Memory:  Immediate;   Fair Recent;   Fair Remote;   Fair  Judgement:  Impaired  Insight:  Present  Psychomotor Activity:  Normal  Concentration:  Fair  Recall:  [employer] of Knowledge:Fair  Language: Fair  Akathisia:  No  Handed:  Right  AIMS (if indicated):     Assets:  Communication Skills Desire for Improvement Housing Physical Health Resilience Social Support Transportation Vocational/Educational  Sleep:  Number of Hours: 6.15  Cognition: WNL  ADL's:  Intact   Mental Status Per Nursing Assessment::   On Admission:  Self-harm thoughts, Self-harm behaviors  Demographic Factors:  Divorced or widowed and Caucasian  Loss Factors: Loss of  significant relationship  Historical Factors: Family history of mental illness or substance abuse and Impulsivity  Risk Reduction Factors:   Responsible for children under 67 years of age, Sense of responsibility to family, Employed, Living with another person, especially a relative and Positive social support  Continued Clinical Symptoms:  Bipolar Disorder:   Depressive phase Depression:   Comorbid alcohol abuse/dependence Impulsivity Alcohol/Substance Abuse/Dependencies  Cognitive Features That Contribute To Risk:  None    Suicide Risk:  Minimal: No identifiable suicidal ideation.  Patients presenting with no risk factors but with morbid ruminations; may be classified as minimal risk based on the severity of the depressive symptoms    Plan Of Care/Follow-up recommendations:  Activity:  AS TOLERATED Diet:  REGULAR Other:  KEEPFOLLOW UP APPOINTMENTS  Kristine Linea, MD 02/02/2019, 8:19 AM

## 2019-02-02 NOTE — Progress Notes (Signed)
  [facility] Adult Case Management Discharge Plan :  Will you be returning to the same living situation after discharge:  Yes,  to her mother's house At discharge, do you have transportation home?: Yes,  father Do you have the ability to pay for your medications: Yes,  mental health  Release of information consent forms completed and in the chart;   Patient to Follow up at: Follow-up Information    [facility] Follow up.   Why:  Walk in hours are Monday-Friday from 9:00AM-4:00PM.  Contact information: [address] [place] [address] [phone]           Next level of care provider has access to [facility] Link:no  Safety Planning and Suicide Prevention discussed: Yes,  SPE completed with pts mother  Have you used any form of tobacco in the last 30 days? (Cigarettes, Smokeless Tobacco, Cigars, and/or Pipes): Yes  Has patient been referred to the Quitline?: Patient refused referral  Patient has been referred for addiction treatment: N/A  Mechele Dawley, LCSW 02/02/2019, 9:18 AM

## 2019-02-02 NOTE — Tx Team (Deleted)
Pt is scheduled for discharge prior to schedule treatment team meeting.  Iris Pert, MSW, LCSW Clinical Social Work 02/02/2019 9:23 AM

## 2019-02-02 NOTE — Progress Notes (Signed)
Patient denies SI/HI, denies A/V hallucinations. Patient verbalizes understanding of discharge instructions, follow up care and prescriptions.7 days medicines given to patient.Patient refused to take back her home medicine since she overdosed on that.Medicines returned to pharmacy.Patient given all belongings from [facility] locker. Patient escorted out by staff, transported by family.

## 2019-02-02 NOTE — Discharge Summary (Signed)
Physician Discharge Summary Note  Patient:  Orland DecKatherine Gupton Owusu is an 28 y.o., female MRN:  [id number] DOB:  07/14/1991 Patient phone:  [phone] (home)  Patient address:   [address] [place] [id number],  Total Time spent with patient: 20 minutes plus 15 min on care coordination and documentation  Date of Admission:  01/31/2019 Date of Discharge: 02/02/2019  Reason for Admission:  Overdose.  History of Present Illness:   Identifying data. Ms. Jana Halfmory is a 28 year old female with a history of mood instability.  Chief complaint. "I was a mistake."  History f present illness. Informationwasobtained from the patient and techart. The patient was brought to the hospital after found unconscious with carefully written suicidl notes. Overdose on medication and carbon monoxide poisoning are suspected but te patient admits to pils only. She became increasingly restless, anxious and suicidal after her husband left her for her best friend. Reports poor sleep, no appetite, crying. Denies psychotic symptoms. Reports high anxiety. Positive for amphetamies and benzodiazepines. Patient denies curret use.  Past psychiatri history. No hospitalizations or suicide attemts. Treted with success with Zooft and Wellbutrin. Reports periods of mania.  Family psychiatric history. None reported.  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 Livves with wto sons ages 815 and 5611 month with hergrndparents. They were out of town when she overdosed.    Principal Problem: Bipolar I disorder, most recent episode depressed, severe without psychotic features [facility](HCC) Discharge Diagnoses: Principal Problem:   Bipolar I disorder, most recent episode depressed, severe without psychotic features (HCC) Active Problems:   Drug overdose   Suicide attempt (HCC)   Amphetamine abuse (HCC)  Past Medical History:  Past Medical History:  Diagnosis  Date  . Anxiety   . Constipation   . Depression   . Headache   . Mini stroke (HCC)   . Schizophrenia [facility](HCC)     Past Surgical History:  Procedure Laterality Date  . CESAREAN SECTION    . CESAREAN SECTION N/A 02/26/2018   Procedure: REPEAT CESAREAN SECTION;  Surgeon: Hildred Laserherry, Anika, MD;  Location: [facility] ORS;  Service: Obstetrics;  Laterality: N/A;  . WISDOM TOOTH EXTRACTION     Family History:  Family History  Problem Relation Age of Onset  . Migraines Mother   . Asthma Brother   . Hyperlipidemia Brother   . Asthma Maternal Grandmother   . Hyperlipidemia Maternal Grandmother   . Heart failure Maternal Grandfather   . Hyperlipidemia Maternal Grandfather   . Heart failure Paternal Grandmother   . Cancer Paternal Grandmother        tumor  . Asthma Son   . Thyroid disease Maternal Aunt   . Cancer Maternal Aunt        melanoma   Social History:  Social History   Substance and Sexual Activity  Alcohol Use No  . Alcohol/week: 0.0 standard drinks   Comment: wine at night, occasionally     Social History   Substance and Sexual Activity  Drug Use Not Currently  . Types: Amphetamines    Social History   Socioeconomic History  . Marital status: Married    Spouse name: Not on file  . Number of children: Not on file  . Years of education: Not on file  . Highest education level: Not on file  Occupational History  . Not on file  Social Needs  . Financial resource strain: Not on file  .  Food insecurity:    Worry: Not on file    Inability: Not on file  . Transportation needs:    Medical: Not on file    Non-medical: Not on file  Tobacco Use  . Smoking status: Former Smoker    Packs/day: 0.50    Years: 4.00    Pack years: 2.00    Types: Cigarettes    Last attempt to quit: 06/26/2017    Years since quitting: 1.6  . Smokeless tobacco: Never Used  Substance and Sexual Activity  . Alcohol use: No    Alcohol/week: 0.0 standard drinks    Comment: wine at night, occasionally   . Drug use: Not Currently    Types: Amphetamines  . Sexual activity: Yes    Partners: Male    Birth control/protection: None, Surgical    Comment: Pregnant   Lifestyle  . Physical activity:    Days per week: Not on file    Minutes per session: Not on file  . Stress: Not on file  Relationships  . Social connections:    Talks on phone: Not on file    Gets together: Not on file    Attends religious service: Not on file    Active member of club or organization: Not on file    Attends meetings of clubs or organizations: Not on file    Relationship status: Not on file  Other Topics Concern  . Not on file  Social History Narrative  . Not on file    Hospital Course:    Ms. Offutt s a 28 year old female with a history suggestive of bipolar disorder admitted after serious suicida attempt involving Carbon monoxide poisoning, overdose and suicidal notes in the context of spousal infidelity. She was started on medications and tolerated them well. There was an outpour of support from friends and family. At the time of discharge, the patient is no longer suicidal. She is able to contract for safety. She is forward thinking and optimistic about the future. She is a loving mother, daughter and granddaughter. She will stay with her children and grandparents.  #Mood, improved -continue  Zoloft 100 mg daily -Wellbutrin 100 mg daily -start Tegretol 200 mg BID for mood stabilization -Trazodone 100 mg nightly  #GERD Protonix 40 mg daily  #Substance abuse -denies any and minimizes problems  #Disposition -discharge to home with family -follow up with [facility]  Physical Findings: AIMS: Facial and Oral Movements Muscles of Facial Expression: None, normal Lips and Perioral Area: None, normal Jaw: None, normal Tongue: None, normal,Extremity Movements Upper (arms, wrists, hands, fingers): None, normal Lower (legs, knees, ankles, toes): None, normal, Trunk Movements Neck, shoulders, hips:  None, normal, Overall Severity Severity of abnormal movements (highest score from questions above): None, normal Incapacitation due to abnormal movements: None, normal Patient's awareness of abnormal movements (rate only patient's report): No Awareness, Dental Status Current problems with teeth and/or dentures?: No Does patient usually wear dentures?: No  CIWA:  CIWA-Ar Total: 0 COWS:     Musculoskeletal: Strength & Muscle Tone: within normal limits Gait & Station: normal Patient leans: N/A  Psychiatric Specialty Exam: Physical Exam  Nursing note and vitals reviewed. Psychiatric: She has a normal mood and affect. Her speech is normal and behavior is normal. Thought content normal. Cognition and memory are normal. She expresses impulsivity.    Review of Systems  Neurological: Negative.   Psychiatric/Behavioral: Positive for depression.  All other systems reviewed and are negative.   Blood pressure 123/82, pulse 64, temperature  97.8 F (36.6 C), temperature source Oral, resp. rate 18, height 5\' 6"  (1.676 m), weight 72.6 kg, SpO2 99 %, not currently breastfeeding.Body mass index is 25.82 kg/m.  General Appearance: Casual  Eye Contact:  Good  Speech:  Clear and Coherent  Volume:  Normal  Mood:  Depressed  Affect:  Appropriate  Thought Process:  Goal Directed and Descriptions of Associations: Intact  Orientation:  Full (Time, Place, and Person)  Thought Content:  WDL  Suicidal Thoughts:  No  Homicidal Thoughts:  No  Memory:  Immediate;   Fair Recent;   Fair Remote;   Fair  Judgement:  Impaired  Insight:  Shallow  Psychomotor Activity:  Normal  Concentration:  Concentration: Fair and Attention Span: Fair  Recall:  [employer] of Knowledge:  Fair  Language:  Fair  Akathisia:  No  Handed:  Right  AIMS (if indicated):     Assets:  Communication Skills Desire for Improvement Housing Physical Health Resilience Social Support Transportation Vocational/Educational  ADL's:   Intact  Cognition:  WNL  Sleep:  Number of Hours: 6.15     Have you used any form of tobacco in the last 30 days? (Cigarettes, Smokeless Tobacco, Cigars, and/or Pipes): Yes  Has this patient used any form of tobacco in the last 30 days? (Cigarettes, Smokeless Tobacco, Cigars, and/or Pipes) Yes, Yes, A prescription for an FDA-approved tobacco cessation medication was offered at discharge and the patient refused  Blood Alcohol level:  Lab Results  Component Value Date   ETH <10 01/29/2019    Metabolic Disorder Labs:  Lab Results  Component Value Date   HGBA1C 4.7 (L) 01/31/2019   MPG 88.19 01/31/2019   No results found for: PROLACTIN Lab Results  Component Value Date   CHOL 120 01/31/2019   TRIG 147 01/31/2019   HDL 50 01/31/2019   CHOLHDL 2.4 01/31/2019   VLDL 29 01/31/2019   LDLCALC 41 01/31/2019    See Psychiatric Specialty Exam and Suicide Risk Assessment completed by Attending Physician prior to discharge.  Discharge destination:  Home  Is patient on multiple antipsychotic therapies at discharge:  No   Has Patient had three or more failed trials of antipsychotic monotherapy by history:  No  Recommended Plan for Multiple Antipsychotic Therapies: NA  Discharge Instructions    Diet - low sodium heart healthy   Complete by:  As directed    Increase activity slowly   Complete by:  As directed      Allergies as of 02/02/2019      Reactions   Amoxicillin Hives, Rash   Penicillins Hives, Rash   Has patient had a PCN reaction causing immediate rash, facial/tongue/throat swelling, SOB or lightheadedness with hypotension: Yes Has patient had a PCN reaction causing severe rash involving mucus membranes or skin necrosis: Yes Has patient had a PCN reaction that required hospitalization: No Has patient had a PCN reaction occurring within the last 10 years: Yes If all of the above answers are "NO", then may proceed with Cephalosporin use.   Adhesive [tape]    Pull skin  off.  Both paper tape and tegaderm are ok   Latex Rash   Nickel Hives, Rash      Medication List    STOP taking these medications   nicotine 21 mg/24hr patch Commonly known as:  NICODERM CQ - dosed in mg/24 hours     TAKE these medications     Indication  buPROPion 100 MG 12 hr tablet Commonly known  as:  WELLBUTRIN SR Take 1 tablet (100 mg total) by mouth daily.  Indication:  Major Depressive Disorder   carbamazepine 200 MG tablet Commonly known as:  TEGRETOL Take 1 tablet (200 mg total) by mouth 2 (two) times daily at 8 am and 10 pm.  Indication:  Manic-Depression   hydrOXYzine 50 MG tablet Commonly known as:  ATARAX/VISTARIL Take 1 tablet (50 mg total) by mouth 3 (three) times daily as needed for anxiety.  Indication:  Feeling Anxious   pantoprazole 40 MG tablet Commonly known as:  PROTONIX Take 1 tablet (40 mg total) by mouth daily.  Indication:  Gastroesophageal Reflux Disease   prenatal vitamin w/FE, FA 29-1 MG Chew chewable tablet Chew 2 tablets by mouth daily at 12 noon.  Indication:  GENERAL HEALTH   sertraline 100 MG tablet Commonly known as:  ZOLOFT Take 1 tablet (100 mg total) by mouth daily.  Indication:  Major Depressive Disorder   traZODone 100 MG tablet Commonly known as:  DESYREL Take 1 tablet (100 mg total) by mouth at bedtime as needed for sleep.  Indication:  Trouble Sleeping        Follow-up recommendations:  Activity:  as tolerated Diet:  regular Other:  keep follow up appointments  Comments:     Signed: Kristine Linea, MD 02/02/2019, 8:50 AM

## 2019-02-02 NOTE — Tx Team (Signed)
Interdisciplinary Treatment and Diagnostic Plan Update  02/02/2019 Time of Session: 1030AM Terri Holt MRN: [id number]  Principal Diagnosis: Bipolar I disorder, most recent episode depressed, severe without psychotic features (HCC)  Secondary Diagnoses: Principal Problem:   Bipolar I disorder, most recent episode depressed, severe without psychotic features (HCC) Active Problems:   Drug overdose   Suicide attempt (HCC)   Amphetamine abuse (HCC)   Current Medications:  Current Facility-Administered Medications  Medication Dose Route Frequency Provider Last Rate Last Dose  . alum & mag hydroxide-simeth (MAALOX/MYLANTA) 200-200-20 MG/5ML suspension 30 mL  30 mL Oral Q4H PRN Clapacs, John T, MD      . buPROPion [facility] SR) 12 hr tablet 100 mg  100 mg Oral Daily Pucilowska, Jolanta B, MD   100 mg at 02/02/19 0827  . carbamazepine (TEGRETOL) tablet 200 mg  200 mg Oral BID AC & HS Pucilowska, Jolanta B, MD   200 mg at 02/02/19 0826  . hydrOXYzine (ATARAX/VISTARIL) tablet 50 mg  50 mg Oral TID PRN Clapacs, John T, MD      . magnesium hydroxide (MILK OF MAGNESIA) suspension 30 mL  30 mL Oral Daily PRN Clapacs, John T, MD      . nicotine (NICODERM CQ - dosed in mg/24 hours) patch 21 mg  21 mg Transdermal Daily Clapacs, Jackquline Denmark, MD   21 mg at 02/02/19 0826  . pantoprazole (PROTONIX) EC tablet 40 mg  40 mg Oral Daily Pucilowska, Jolanta B, MD   40 mg at 02/02/19 0826  . sertraline (ZOLOFT) tablet 100 mg  100 mg Oral Daily Pucilowska, Jolanta B, MD   100 mg at 02/02/19 0826  . traZODone (DESYREL) tablet 100 mg  100 mg Oral QHS PRN Clapacs, Jackquline Denmark, MD       Current Outpatient Medications  Medication Sig Dispense Refill  . buPROPion (WELLBUTRIN SR) 100 MG 12 hr tablet Take 1 tablet (100 mg total) by mouth daily. 30 tablet 1  . carbamazepine (TEGRETOL) 200 MG tablet Take 1 tablet (200 mg total) by mouth 2 (two) times daily at 8 am and 10 pm. 60 tablet 1  . hydrOXYzine (ATARAX/VISTARIL)  50 MG tablet Take 1 tablet (50 mg total) by mouth 3 (three) times daily as needed for anxiety. 90 tablet 1  . pantoprazole (PROTONIX) 40 MG tablet Take 1 tablet (40 mg total) by mouth daily. 30 tablet 1  . prenatal vitamin w/FE, FA (NATACHEW) 29-1 MG CHEW chewable tablet Chew 2 tablets by mouth daily at 12 noon.     . sertraline (ZOLOFT) 100 MG tablet Take 1 tablet (100 mg total) by mouth daily. 30 tablet 1  . traZODone (DESYREL) 100 MG tablet Take 1 tablet (100 mg total) by mouth at bedtime as needed for sleep. 30 tablet 1   PTA Medications: No medications prior to admission.    Patient Stressors:    Patient Strengths:    Treatment Modalities: Medication Management, Group therapy, Case management,  1 to 1 session with clinician, Psychoeducation, Recreational therapy.   Physician Treatment Plan for Primary Diagnosis: Bipolar I disorder, most recent episode depressed, severe without psychotic features (HCC) Long Term Goal(s): Improvement in symptoms so as ready for discharge Improvement in symptoms so as ready for discharge   Short Term Goals: Ability to identify changes in lifestyle to reduce recurrence of condition will improve Ability to verbalize feelings will improve Ability to disclose and discuss suicidal ideas Ability to demonstrate self-control will improve Ability to identify and develop effective  coping behaviors will improve Ability to maintain clinical measurements within normal limits will improve Compliance with prescribed medications will improve Ability to identify triggers associated with substance abuse/mental health issues will improve Ability to identify changes in lifestyle to reduce recurrence of condition will improve Ability to demonstrate self-control will improve Ability to identify triggers associated with substance abuse/mental health issues will improve  Medication Management: Evaluate patient's response, side effects, and tolerance of medication  regimen.  Therapeutic Interventions: 1 to 1 sessions, Unit Group sessions and Medication administration.  Evaluation of Outcomes: Adequate for Discharge  Physician Treatment Plan for Secondary Diagnosis: Principal Problem:   Bipolar I disorder, most recent episode depressed, severe without psychotic features (HCC) Active Problems:   Drug overdose   Suicide attempt (HCC)   Amphetamine abuse (HCC)  Long Term Goal(s): Improvement in symptoms so as ready for discharge Improvement in symptoms so as ready for discharge   Short Term Goals: Ability to identify changes in lifestyle to reduce recurrence of condition will improve Ability to verbalize feelings will improve Ability to disclose and discuss suicidal ideas Ability to demonstrate self-control will improve Ability to identify and develop effective coping behaviors will improve Ability to maintain clinical measurements within normal limits will improve Compliance with prescribed medications will improve Ability to identify triggers associated with substance abuse/mental health issues will improve Ability to identify changes in lifestyle to reduce recurrence of condition will improve Ability to demonstrate self-control will improve Ability to identify triggers associated with substance abuse/mental health issues will improve     Medication Management: Evaluate patient's response, side effects, and tolerance of medication regimen.  Therapeutic Interventions: 1 to 1 sessions, Unit Group sessions and Medication administration.  Evaluation of Outcomes: Adequate for Discharge   RN Treatment Plan for Primary Diagnosis: Bipolar I disorder, most recent episode depressed, severe without psychotic features (HCC) Long Term Goal(s): Knowledge of disease and therapeutic regimen to maintain health will improve  Short Term Goals: Ability to demonstrate self-control, Ability to participate in decision making will improve and Ability to verbalize  feelings will improve  Medication Management: RN will administer medications as ordered by provider, will assess and evaluate patient's response and provide education to patient for prescribed medication. RN will report any adverse and/or side effects to prescribing provider.  Therapeutic Interventions: 1 on 1 counseling sessions, Psychoeducation, Medication administration, Evaluate responses to treatment, Monitor vital signs and CBGs as ordered, Perform/monitor CIWA, COWS, AIMS and Fall Risk screenings as ordered, Perform wound care treatments as ordered.  Evaluation of Outcomes: Adequate for Discharge   LCSW Treatment Plan for Primary Diagnosis: Bipolar I disorder, most recent episode depressed, severe without psychotic features (HCC) Long Term Goal(s): Safe transition to appropriate next level of care at discharge, Engage patient in therapeutic group addressing interpersonal concerns.  Short Term Goals: Engage patient in aftercare planning with referrals and resources and Increase skills for wellness and recovery  Therapeutic Interventions: Assess for all discharge needs, 1 to 1 time with Social worker, Explore available resources and support systems, Assess for adequacy in community support network, Educate family and significant other(s) on suicide prevention, Complete Psychosocial Assessment, Interpersonal group therapy.  Evaluation of Outcomes: Adequate for Discharge   Progress in Treatment: Attending groups: Yes. Participating in groups: Yes. Taking medication as prescribed: Yes. Toleration medication: Yes. Family/Significant other contact made: Yes, individual(s) contacted:  Pts mother Patient understands diagnosis: Yes. Discussing patient identified problems/goals with staff: Yes. Medical problems stabilized or resolved: Yes. Denies suicidal/homicidal ideation: Yes. Issues/concerns per  patient self-inventory: No. Other: n/a  New problem(s) identified: No, Describe:   none  New Short Term/Long Term Goal(s): medication management for mood stabilization; development of comprehensive mental wellness/sobriety plan.   Patient Goals:  "To get back to being physically active"  Discharge Plan or Barriers: SPE pamphlet, Mobile Crisis information, and AA/NA information provided to patient for additional community support and resources. Pt is agreeable to [facility].  Reason for Continuation of Hospitalization: none  Estimated Length of Stay: Today 02/02/2019  Attendees: Patient: Terri Holt 02/02/2019 11:19 AM  Physician: Dr Jennet MaduroPucilowska MD 02/02/2019 11:19 AM  Nursing: Curt Bearsyawn Chisem RN 02/02/2019 11:19 AM  RN Care Manager: 02/02/2019 11:19 AM  Social Worker: Zollie Scalelivia Rodarius Kichline LCSW 02/02/2019 11:19 AM  Recreational Therapist: Garret ReddishShay Outlaw CTRS LRT 02/02/2019 11:19 AM  Other: Penni HomansMichaela Stanfield LCSW 02/02/2019 11:19 AM  Other: Lowella Dandyarren Livingston LCSW 02/02/2019 11:19 AM  Other: 02/02/2019 11:19 AM    Scribe for Treatment Team: Charlann Langelivia K Atalaya Zappia, LCSW 02/02/2019 11:19 AM

## 2019-02-02 NOTE — [facility] Suicide Risk Assessment (Signed)
[facility] INPATIENT:  Family/Significant Other Suicide Prevention Education  Suicide Prevention Education:  Education Completed; Bennetta Laos, mom [phone] has been identified by the patient as the family member/significant other with whom the patient will be residing, and identified as the person(s) who will aid the patient in the event of a mental health crisis (suicidal ideations/suicide attempt).  With written consent from the patient, the family member/significant other has been provided the following suicide prevention education, prior to the and/or following the discharge of the patient.  The suicide prevention education provided includes the following:  Suicide risk factors  Suicide prevention and interventions  National Suicide Hotline telephone number  [facility] assessment telephone number  [facility] Emergency Assistance 911  [facility] and/or Residential Mobile Crisis Unit telephone number  Request made of family/significant other to:  Remove weapons (e.g., guns, rifles, knives), all items previously/currently identified as safety concern.    Remove drugs/medications (over-the-counter, prescriptions, illicit drugs), all items previously/currently identified as a safety concern.  The family member/significant other verbalizes understanding of the suicide prevention education information provided.  The family member/significant other agrees to remove the items of safety concern listed above.  Harriett Sine reported she believes that pt ended up in the hospital due to her current separation and the whole process. Harriett Sine denies any guns/weapons in the home pt is currently staying at. Harriett Sine denies any concern of pt being suicidal once discharged from the hospital as long as she is on her medication.   Charlann Lange Kenadie Royce MSW LCSW 02/02/2019, 9:15 AM

## 2019-02-02 NOTE — Plan of Care (Signed)
  Problem: Education: Goal: Knowledge of [facility] Education information/materials will improve Outcome: Progressing   Problem: Self-Concept: Goal: Ability to disclose and discuss suicidal ideas will improve Outcome: Progressing  DAR NOTE: Pt present with flat affect and depressed mood in the unit.  Pt denies physical pain, took all her meds as scheduled. Pt's safety ensured with 15 minute and environmental checks. Pt currently denies SI/HI and A/V hallucinations. Pt verbally agrees to seek staff if SI/HI or A/VH occurs and to consult with staff before acting on these thoughts. Will continue POC.

## 2019-04-07 IMAGING — DX DG CHEST 1V PORT
1 series · 1 of 1 positions shown · non-contrast
Comparison: None.

CLINICAL DATA: Chest pain

EXAM:
PORTABLE CHEST 1 VIEW

[chest ap]
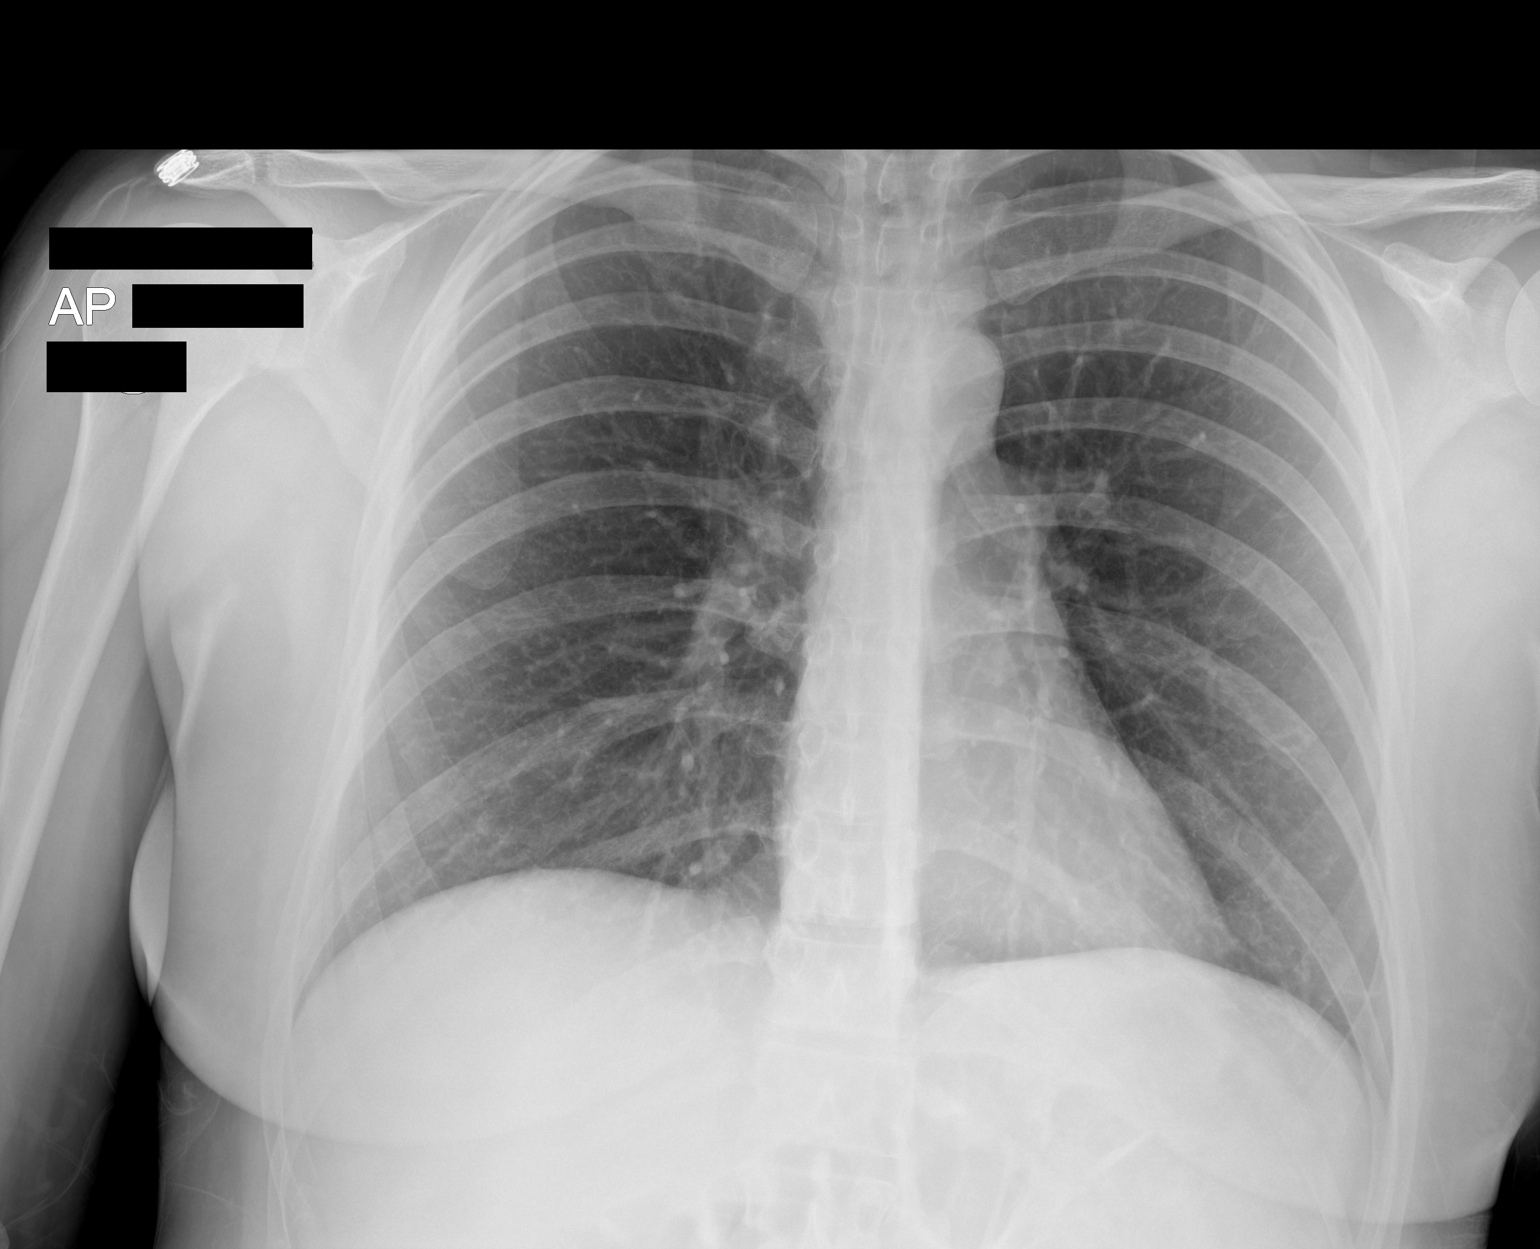

[1 of 1 positions shown; findings below may reference images not displayed]

FINDINGS: The heart size and mediastinal contours are within normal limits.
Both lungs are clear. The visualized skeletal structures are
unremarkable.
IMPRESSION: No active disease.

## 2019-04-07 IMAGING — DX DG WRIST 2V*R*
2 series · 2 of 2 positions shown · non-contrast
Comparison: None.

CLINICAL DATA: Right wrist pain with tingling.  No known injury.

EXAM:
RIGHT WRIST - 2 VIEW

[wrist ap]
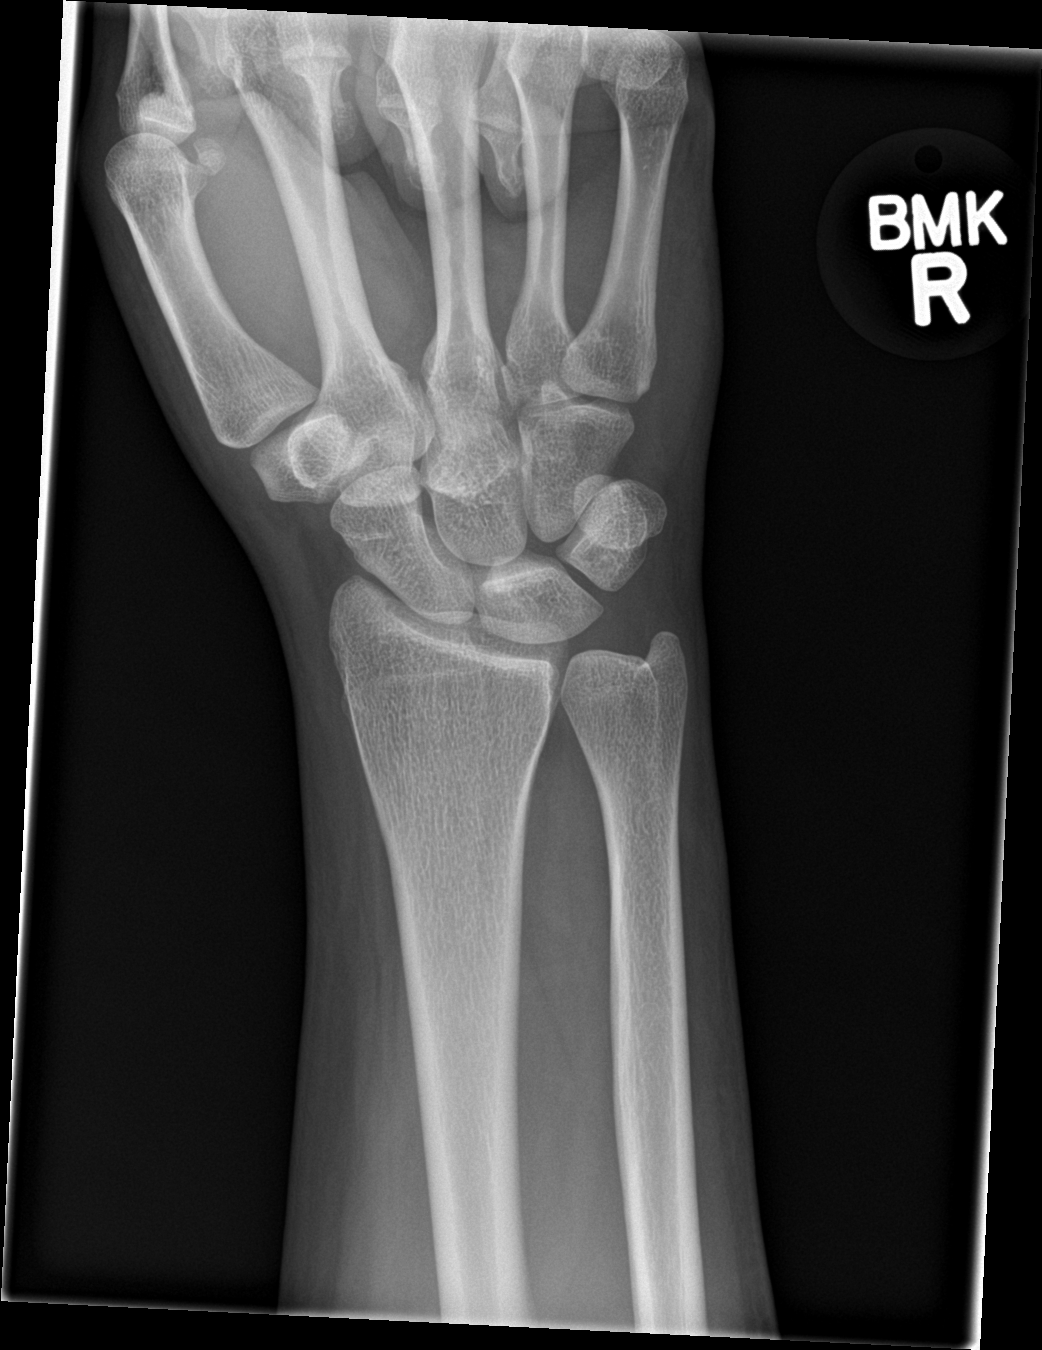

[wrist lat]
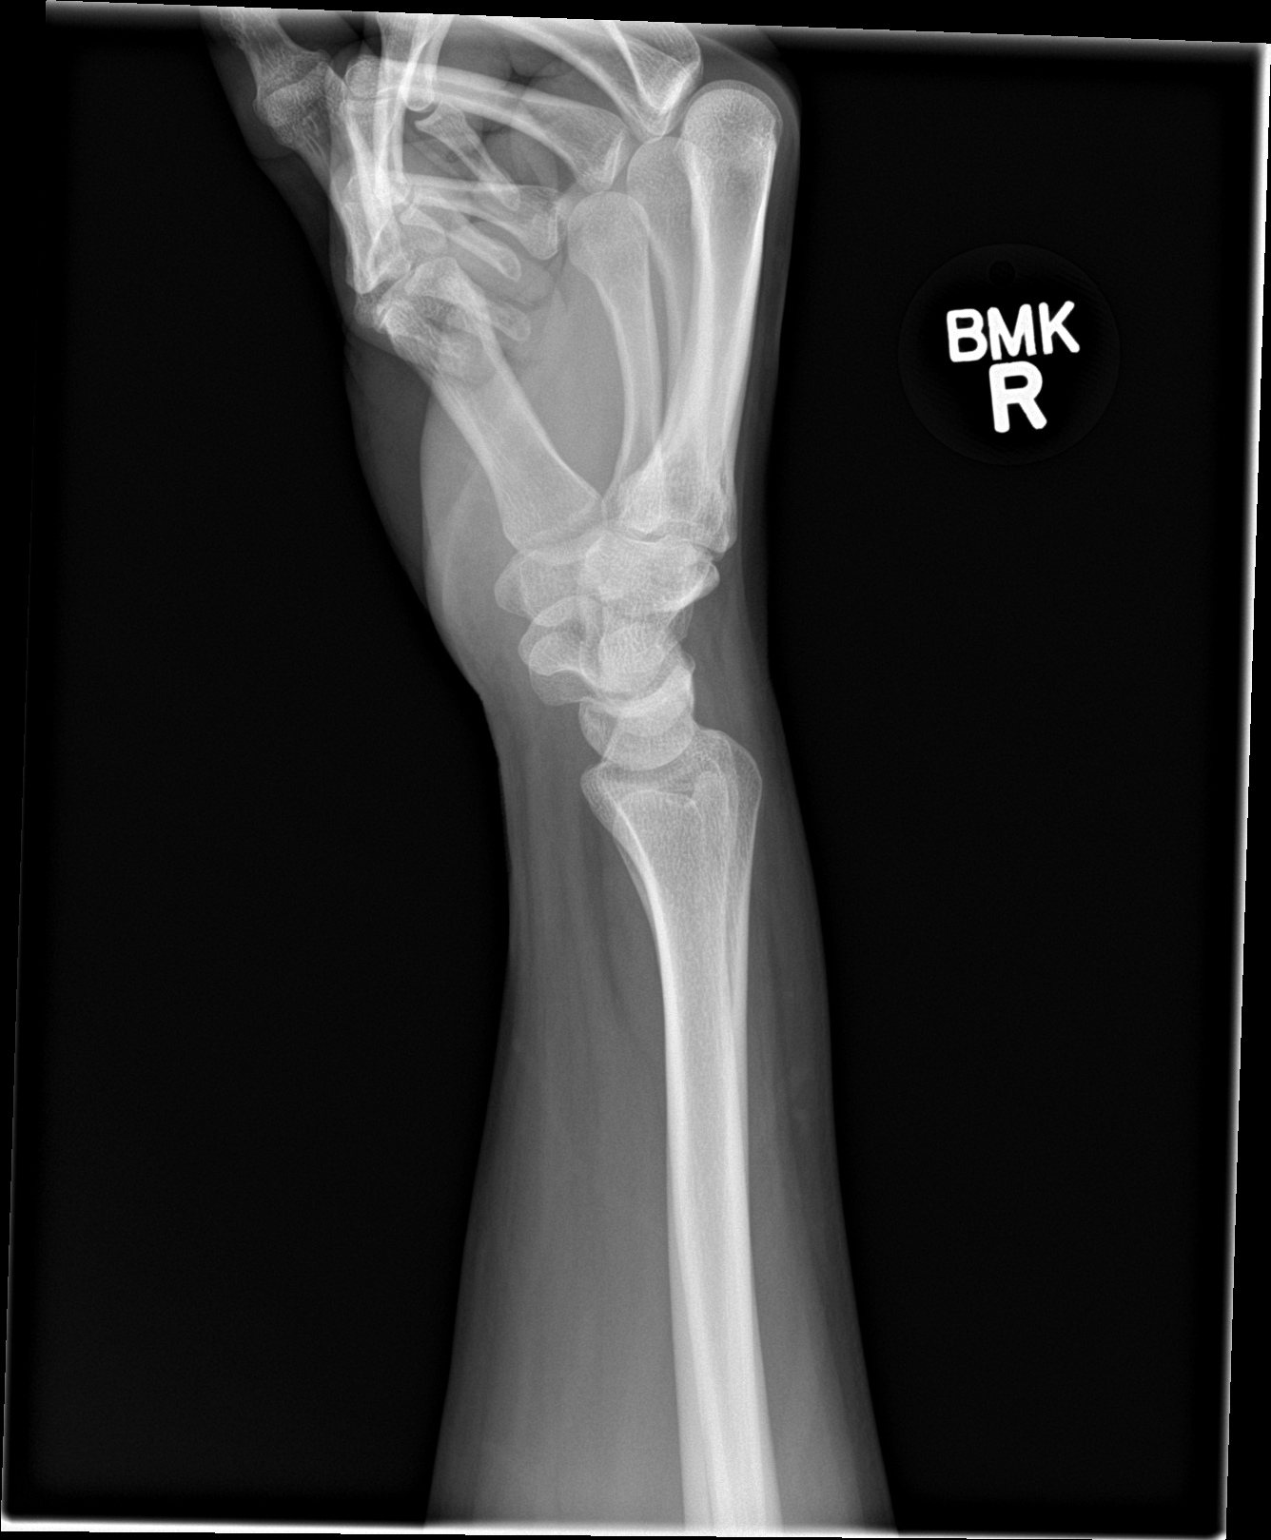

[2 of 2 positions shown; findings below may reference images not displayed]

FINDINGS: There is no evidence of fracture or dislocation. There is no
evidence of arthropathy or other focal bone abnormality. Soft
tissues are unremarkable.
IMPRESSION: Negative.

## 2019-06-09 NOTE — Progress Notes (Signed)
Patient: Terri DecKatherine Gupton Holt Female    DOB: 12-10-1990   28 y.o.   MRN: [id number] Visit Date: 06/10/2019  Today's Provider: Margaretann LovelessJennifer M Naina Sleeper, PA-C   Chief Complaint  Patient presents with  . Weight Gain  . Re Establish Care   Subjective:     Virtual Visit via Telephone Note  I connected with Terri Holt on 06/10/19 at  8:20 AM EDT by a video enabled telemedicine application and verified that I am speaking with the correct person using two identifiers.  Interactive audio and video communications were attempted, although failed due to patient's inability to connect to video. Continued visit with audio only interaction with patient agreement.  Location: Patient: home Provider: [facility]   I discussed the limitations of evaluation and management by telemedicine and the availability of in person appointments. The patient expressed understanding and agreed to proceed.   HPI  Patient establishing care with a new provider. Last time she seen was 11/16/2016. Patient also wants to discuss weight gain. Patient reports that she has gain about 25 lbs. Reports that she has tried Keto diet, cut down sugary drinks,counting calories for six months and is exercising.  Of note while discussing the hospitalization in March and going over medications, the patient realized the had 2 bottles of Carbamazepine 200mg  but the pills were different shapes and colors. She has been taking both twice daily, so 400mg  BID, without being aware.   Allergies  Allergen Reactions  . Amoxicillin Hives and Rash  . Penicillins Hives and Rash    Has patient had a PCN reaction causing immediate rash, facial/tongue/throat swelling, SOB or lightheadedness with hypotension: Yes Has patient had a PCN reaction causing severe rash involving mucus membranes or skin necrosis: Yes Has patient had a PCN reaction that required hospitalization: No Has patient had a PCN reaction occurring within the last 10 years:  Yes If all of the above answers are "NO", then may proceed with Cephalosporin use.   . Adhesive [Tape]     Pull skin off.  Both paper tape and tegaderm are ok  . Latex Rash  . Nickel Hives and Rash     Current Outpatient Medications:  .  buPROPion (WELLBUTRIN SR) 100 MG 12 hr tablet, Take 1 tablet (100 mg total) by mouth daily., Disp: 30 tablet, Rfl: 1 .  carbamazepine (TEGRETOL) 200 MG tablet, Take 1 tablet (200 mg total) by mouth 2 (two) times daily at 8 am and 10 pm., Disp: 60 tablet, Rfl: 1 .  hydrOXYzine (ATARAX/VISTARIL) 50 MG tablet, Take 1 tablet (50 mg total) by mouth 3 (three) times daily as needed for anxiety., Disp: 90 tablet, Rfl: 1 .  pantoprazole (PROTONIX) 40 MG tablet, Take 1 tablet (40 mg total) by mouth daily., Disp: 30 tablet, Rfl: 1 .  sertraline (ZOLOFT) 100 MG tablet, Take 1 tablet (100 mg total) by mouth daily., Disp: 30 tablet, Rfl: 1 .  traZODone (DESYREL) 100 MG tablet, Take 1 tablet (100 mg total) by mouth at bedtime as needed for sleep., Disp: 30 tablet, Rfl: 1  Review of Systems  Constitutional: Positive for unexpected weight change (25 pounds since March 2020).  HENT: Negative.   Respiratory: Negative.   Cardiovascular: Negative.   Psychiatric/Behavioral: Positive for agitation, dysphoric mood and sleep disturbance. The patient is nervous/anxious.     Social History   Tobacco Use  . Smoking status: Former Smoker    Packs/day: 0.50    Years: 4.00  Pack years: 2.00    Types: Cigarettes    Quit date: 06/26/2017    Years since quitting: 1.9  . Smokeless tobacco: Never Used  Substance Use Topics  . Alcohol use: No    Alcohol/week: 0.0 standard drinks    Comment: wine at night, occasionally      Objective:   Wt 175 lb (79.4 kg)   BMI 28.25 kg/m  Vitals:   06/10/19 0819  Weight: 175 lb (79.4 kg)     Physical Exam Constitutional:      General: She is not in acute distress. Pulmonary:     Effort: Pulmonary effort is normal. No  respiratory distress.  Neurological:     Mental Status: She is alert.      No results found for any visits on 06/10/19.     Assessment & Plan    1. High risk medication use Advised patient to only take one carbamazepine 200mg  BID. Will check labs and carbamazepine levels. Once we get these results we will then decide on the best option for weight loss. She is in agreement with plan.  - CBC w/Diff/Platelet - Comprehensive Metabolic Panel (CMET) - TSH - Vitamin D (25 hydroxy) - B12 - Carbamazepine Level (Tegretol), total  2. Weight gain 25 pounds since March but has been accidentally taking the carbamazepine double dose.  - CBC w/Diff/Platelet - Comprehensive Metabolic Panel (CMET) - TSH - Vitamin D (25 hydroxy) - B12 - Carbamazepine Level (Tegretol), total  3. Irritability See above medical treatment plan. - CBC w/Diff/Platelet - Comprehensive Metabolic Panel (CMET) - TSH - Vitamin D (25 hydroxy) - B12 - Carbamazepine Level (Tegretol), total  4. Fatigue, unspecified type See above medical treatment plan. - CBC w/Diff/Platelet - Comprehensive Metabolic Panel (CMET) - TSH - Vitamin D (25 hydroxy) - B12 - Carbamazepine Level (Tegretol), total  I discussed the assessment and treatment plan with the patient. The patient was provided an opportunity to ask questions and all were answered. The patient agreed with the plan and demonstrated an understanding of the instructions.   The patient was advised to call back or seek an in-person evaluation if the symptoms worsen or if the condition fails to improve as anticipated.  I provided 17 minutes of non-face-to-face time during this encounter.    Mar Daring, PA-C  [facility]

## 2019-06-10 ENCOUNTER — Encounter: Payer: Self-pay | Admitting: Physician Assistant

## 2019-06-10 ENCOUNTER — Telehealth (INDEPENDENT_AMBULATORY_CARE_PROVIDER_SITE_OTHER): Payer: Self-pay | Admitting: Physician Assistant

## 2019-06-10 ENCOUNTER — Telehealth: Payer: Self-pay | Admitting: Family

## 2019-06-10 VITALS — Wt 175.0 lb

## 2019-06-10 DIAGNOSIS — M546 Pain in thoracic spine: Secondary | ICD-10-CM

## 2019-06-10 DIAGNOSIS — Z79899 Other long term (current) drug therapy: Secondary | ICD-10-CM

## 2019-06-10 DIAGNOSIS — R635 Abnormal weight gain: Secondary | ICD-10-CM

## 2019-06-10 DIAGNOSIS — R454 Irritability and anger: Secondary | ICD-10-CM

## 2019-06-10 DIAGNOSIS — R5383 Other fatigue: Secondary | ICD-10-CM

## 2019-06-10 MED ORDER — NAPROXEN 500 MG PO TABS: 500.0000 mg | ORAL_TABLET | Freq: Two times a day (BID) | ORAL | 0 refills | Status: DC

## 2019-06-10 MED ORDER — BACLOFEN 10 MG PO TABS: 10.0000 mg | ORAL_TABLET | Freq: Three times a day (TID) | ORAL | 0 refills | Status: DC

## 2019-06-10 NOTE — Progress Notes (Signed)

## 2019-06-12 ENCOUNTER — Telehealth: Payer: Self-pay

## 2019-06-12 LAB — CBC WITH DIFFERENTIAL/PLATELET
Basophils Absolute: 0 10*3/uL (ref 0.0–0.2)
Basos: 1 %
EOS (ABSOLUTE): 0.3 10*3/uL (ref 0.0–0.4)
Eos: 4 %
Hematocrit: 44.2 % (ref 34.0–46.6)
Hemoglobin: 15 g/dL (ref 11.1–15.9)
Immature Grans (Abs): 0 10*3/uL (ref 0.0–0.1)
Immature Granulocytes: 0 %
Lymphocytes Absolute: 2.1 10*3/uL (ref 0.7–3.1)
Lymphs: 26 %
MCH: 31.2 pg (ref 26.6–33.0)
MCHC: 33.9 g/dL (ref 31.5–35.7)
MCV: 92 fL (ref 79–97)
Monocytes Absolute: 0.6 10*3/uL (ref 0.1–0.9)
Monocytes: 7 %
Neutrophils Absolute: 4.9 10*3/uL (ref 1.4–7.0)
Neutrophils: 62 %
Platelets: 214 10*3/uL (ref 150–450)
RBC: 4.81 x10E6/uL (ref 3.77–5.28)
RDW: 12.4 % (ref 11.7–15.4)
WBC: 8 10*3/uL (ref 3.4–10.8)

## 2019-06-12 LAB — COMPREHENSIVE METABOLIC PANEL
ALT: 17 IU/L (ref 0–32)
AST: 17 IU/L (ref 0–40)
Albumin/Globulin Ratio: 1.9 (ref 1.2–2.2)
Albumin: 4.3 g/dL (ref 3.9–5.0)
Alkaline Phosphatase: 63 IU/L (ref 39–117)
BUN/Creatinine Ratio: 15 (ref 9–23)
BUN: 12 mg/dL (ref 6–20)
Bilirubin Total: 0.3 mg/dL (ref 0.0–1.2)
CO2: 21 mmol/L (ref 20–29)
Calcium: 9.3 mg/dL (ref 8.7–10.2)
Chloride: 105 mmol/L (ref 96–106)
Creatinine, Ser: 0.81 mg/dL (ref 0.57–1.00)
GFR calc Af Amer: 113 mL/min/{1.73_m2} (ref >59)
GFR calc non Af Amer: 98 mL/min/{1.73_m2} (ref >59)
Globulin, Total: 2.3 g/dL (ref 1.5–4.5)
Glucose: 90 mg/dL (ref 65–99)
Potassium: 4 mmol/L (ref 3.5–5.2)
Sodium: 141 mmol/L (ref 134–144)
Total Protein: 6.6 g/dL (ref 6.0–8.5)

## 2019-06-12 LAB — VITAMIN B12: Vitamin B-12: 450 pg/mL (ref 232–1245)

## 2019-06-12 LAB — TSH: TSH: 2.47 u[IU]/mL (ref 0.450–4.500)

## 2019-06-12 LAB — CARBAMAZEPINE LEVEL, TOTAL: Carbamazepine (Tegretol), S: 3.4 ug/mL — ABNORMAL LOW (ref 4.0–12.0)

## 2019-06-12 LAB — VITAMIN D 25 HYDROXY (VIT D DEFICIENCY, FRACTURES): Vit D, 25-Hydroxy: 22.7 ng/mL — ABNORMAL LOW (ref 30.0–100.0)

## 2019-06-12 NOTE — Telephone Encounter (Signed)
Viewed by Berniece Pap on 06/12/2019 8:34 AM Written by Mar Daring, PA-C on 06/12/2019 8:29 AM All labs were normal with exception of Vit D which is borderline low. Recommend OTC Vit D supplement of 1000-2000 IU daily

## 2019-06-12 NOTE — Telephone Encounter (Signed)
-----   Message from Mar Daring, [place] sent at 06/12/2019  8:29 AM EDT ----- All labs were normal with exception of Vit D which is borderline low. Recommend OTC Vit D supplement of 1000-2000 IU daily.

## 2020-10-24 ENCOUNTER — Telehealth (INDEPENDENT_AMBULATORY_CARE_PROVIDER_SITE_OTHER): Payer: Medicaid Other | Admitting: Physician Assistant

## 2020-10-24 DIAGNOSIS — G43809 Other migraine, not intractable, without status migrainosus: Secondary | ICD-10-CM

## 2020-10-24 MED ORDER — PROMETHAZINE HCL 12.5 MG PO TABS: 12.5000 mg | ORAL_TABLET | Freq: Three times a day (TID) | ORAL | 0 refills | Status: DC | PRN

## 2020-10-24 MED ORDER — PREDNISONE 10 MG PO TABS: ORAL_TABLET | ORAL | 0 refills | Status: DC

## 2020-10-24 NOTE — Patient Instructions (Signed)

## 2020-10-24 NOTE — Progress Notes (Signed)
MyChart Video Visit    Virtual Visit via Video Note   This visit type was conducted due to national recommendations for restrictions regarding the COVID-19 Pandemic (e.g. social distancing) in an effort to limit this patient's exposure and mitigate transmission in our community. This patient is at least at moderate risk for complications without adequate follow up. This format is felt to be most appropriate for this patient at this time. Physical exam was limited by quality of the video and audio technology used for the visit.   Patient location: Home Provider location: Office   I discussed the limitations of evaluation and management by telemedicine and the availability of in person appointments. The patient expressed understanding and agreed to proceed.  Patient: Terri Holt   DOB: 19-Oct-1991   29 y.o. Female  MRN: [id number] Visit Date: 10/24/2020  Today's healthcare provider: Trey Sailors, PA-C   Chief Complaint  Patient presents with  . Headache  I,Johari Pinney M Phoenicia Pirie,acting as a scribe for Trey Sailors, PA-C.,have documented all relevant documentation on the behalf of Trey Sailors, PA-C,as directed by  Trey Sailors, PA-C while in the presence of Trey Sailors, PA-C.  Subjective    Headache  This is a new problem. The current episode started 1 to 4 weeks ago. The problem occurs intermittently. The problem has been unchanged. The pain is located in the frontal region. The pain does not radiate. The quality of the pain is described as throbbing and stabbing. The pain is at a severity of 4/10. The pain is moderate. Associated symptoms include blurred vision, dizziness, nausea, scalp tenderness, a visual change and weakness. Pertinent negatives include no sinus pressure or vomiting. The symptoms are aggravated by bright light and noise. She has tried Excedrin and darkened room for the symptoms. The treatment provided mild relief.    Patient with a history  of migraines reports a 1.5 week headache. She reports this feels like an early migraine though it never gets completely migraine level. She has some sensitivity to light though not sound currently. She has tried excedrin without success, ibuprofen without success. These normally make her headaches go away.     Medications: Outpatient Medications Prior to Visit  Medication Sig  . baclofen (LIORESAL) 10 MG tablet Take 1 tablet (10 mg total) by mouth 3 (three) times daily. (Patient not taking: Reported on 10/24/2020)  . buPROPion (WELLBUTRIN SR) 100 MG 12 hr tablet Take 1 tablet (100 mg total) by mouth daily. (Patient not taking: Reported on 10/24/2020)  . carbamazepine (TEGRETOL) 200 MG tablet Take 1 tablet (200 mg total) by mouth 2 (two) times daily at 8 am and 10 pm. (Patient not taking: Reported on 10/24/2020)  . hydrOXYzine (ATARAX/VISTARIL) 50 MG tablet Take 1 tablet (50 mg total) by mouth 3 (three) times daily as needed for anxiety. (Patient not taking: Reported on 10/24/2020)  . naproxen (NAPROSYN) 500 MG tablet Take 1 tablet (500 mg total) by mouth 2 (two) times daily with a meal. (Patient not taking: Reported on 10/24/2020)  . pantoprazole (PROTONIX) 40 MG tablet Take 1 tablet (40 mg total) by mouth daily. (Patient not taking: Reported on 10/24/2020)  . sertraline (ZOLOFT) 100 MG tablet Take 1 tablet (100 mg total) by mouth daily. (Patient not taking: Reported on 10/24/2020)  . traZODone (DESYREL) 100 MG tablet Take 1 tablet (100 mg total) by mouth at bedtime as needed for sleep. (Patient not taking: Reported on 10/24/2020)   No facility-administered medications  prior to visit.    Review of Systems  HENT: Negative for sinus pressure.   Eyes: Positive for blurred vision.  Gastrointestinal: Positive for nausea. Negative for vomiting.  Neurological: Positive for dizziness, weakness and headaches.      Objective    There were no vitals taken for this visit.   Physical  Exam Constitutional:      Appearance: Normal appearance.  Pulmonary:     Effort: Pulmonary effort is normal. No respiratory distress.  Neurological:     Mental Status: She is alert.  Psychiatric:        Mood and Affect: Mood normal.        Behavior: Behavior normal.        Assessment & Plan     1. Other migraine without status migrainosus, not intractable  Acute flare of chronic migraine headaches. Treat as below to try and break headache cycle. Return precautions counseled.  - predniSONE (DELTASONE) 10 MG tablet; Take 6 pills on day 1, 5 pills on day 2 and so on until complete.  Dispense: 21 tablet; Refill: 0 - promethazine (PHENERGAN) 12.5 MG tablet; Take 1 tablet (12.5 mg total) by mouth every 8 (eight) hours as needed for nausea or vomiting.  Dispense: 20 tablet; Refill: 0   Return if symptoms worsen or fail to improve.     I discussed the assessment and treatment plan with the patient. The patient was provided an opportunity to ask questions and all were answered. The patient agreed with the plan and demonstrated an understanding of the instructions.   The patient was advised to call back or seek an in-person evaluation if the symptoms worsen or if the condition fails to improve as anticipated.   ITrey Sailors, PA-C, have reviewed all documentation for this visit. The documentation on 10/24/20 for the exam, diagnosis, procedures, and orders are all accurate and complete.  The entirety of the information documented in the History of Present Illness, Review of Systems and Physical Exam were personally obtained by me. Portions of this information were initially documented by [facility] and reviewed by me for thoroughness and accuracy.    Maryella Shivers [facility] [phone] (phone) [phone] (fax)  [facility]

## 2021-07-25 ENCOUNTER — Encounter: Payer: Self-pay | Admitting: Family Medicine

## 2021-07-25 ENCOUNTER — Ambulatory Visit (INDEPENDENT_AMBULATORY_CARE_PROVIDER_SITE_OTHER): Payer: 59 | Admitting: Family Medicine

## 2021-07-25 ENCOUNTER — Other Ambulatory Visit: Payer: Self-pay

## 2021-07-25 VITALS — BP 128/87 | HR 95 | Temp 98.6°F | Ht 66.0 in | Wt 173.2 lb

## 2021-07-25 DIAGNOSIS — G43809 Other migraine, not intractable, without status migrainosus: Secondary | ICD-10-CM | POA: Diagnosis not present

## 2021-07-25 DIAGNOSIS — R635 Abnormal weight gain: Secondary | ICD-10-CM | POA: Diagnosis not present

## 2021-07-25 DIAGNOSIS — F332 Major depressive disorder, recurrent severe without psychotic features: Secondary | ICD-10-CM | POA: Diagnosis not present

## 2021-07-25 DIAGNOSIS — F314 Bipolar disorder, current episode depressed, severe, without psychotic features: Secondary | ICD-10-CM

## 2021-07-25 DIAGNOSIS — T1491XA Suicide attempt, initial encounter: Secondary | ICD-10-CM | POA: Diagnosis not present

## 2021-07-25 MED ORDER — PHENTERMINE HCL 30 MG PO CAPS: 30.0000 mg | ORAL_CAPSULE | ORAL | 3 refills | Status: DC

## 2021-07-25 NOTE — Progress Notes (Signed)
Established patient visit   Patient: Terri Holt   DOB: Sep 21, 1991   30 y.o. Female  MRN: [id number] Visit Date: 07/25/2021  Today's healthcare provider: Wilhemena Durie, MD   Chief Complaint  Patient presents with   Weight Gain   Subjective  -------------------------------------------------------------------------------------------------------------------- HPI  Patient states that she is gaining from 145 to 173 pounds over the past 2 years.  She states that she is exercising and has been working hard on dietary changes and nothing works. She is status post BTL and is gravida 2 para 21 with a 62-year-old and 43-year-old sons. Overall she feels well except for the weight gain.  She is doing better emotionally as she gets CBT weekly.  To her knowledge she has not taken diet pills. Follow up for Weight gain:  The patient was last seen for this 2 years ago.(06/10/2019)  Changes made at last visit include: had telemed visit with [facility]. Checked labs.  She reports good compliance with treatment. She feels that condition is Unchanged. She is not having side effects. None   Patient has tried intermittent fasting, changes in diet, no sugary food or drinks except maybe a Diet Dr.Pepper every blue moon. Does full body work out and walking at least 2 miles a day. Still can not seem to lose any weight more so sees it all keeps her weight constant instead of fluctuating. Currently only taking trazadone 100 mg as needed as well as hydroxyzine HCl 50 mg. Has not had either refilled in over a year and still has several available to her.  -----------------------------------------------------------------------------------------     Medications: Outpatient Medications Prior to Visit  Medication Sig   hydrOXYzine (ATARAX/VISTARIL) 50 MG tablet Take 1 tablet (50 mg total) by mouth 3 (three) times daily as needed for anxiety.   traZODone (DESYREL) 100 MG tablet Take 1 tablet  (100 mg total) by mouth at bedtime as needed for sleep.   baclofen (LIORESAL) 10 MG tablet Take 1 tablet (10 mg total) by mouth 3 (three) times daily. (Patient not taking: No sig reported)   buPROPion (WELLBUTRIN SR) 100 MG 12 hr tablet Take 1 tablet (100 mg total) by mouth daily. (Patient not taking: No sig reported)   carbamazepine (TEGRETOL) 200 MG tablet Take 1 tablet (200 mg total) by mouth 2 (two) times daily at 8 am and 10 pm. (Patient not taking: No sig reported)   naproxen (NAPROSYN) 500 MG tablet Take 1 tablet (500 mg total) by mouth 2 (two) times daily with a meal. (Patient not taking: No sig reported)   pantoprazole (PROTONIX) 40 MG tablet Take 1 tablet (40 mg total) by mouth daily. (Patient not taking: No sig reported)   predniSONE (DELTASONE) 10 MG tablet Take 6 pills on day 1, 5 pills on day 2 and so on until complete. (Patient not taking: Reported on 07/25/2021)   promethazine (PHENERGAN) 12.5 MG tablet Take 1 tablet (12.5 mg total) by mouth every 8 (eight) hours as needed for nausea or vomiting. (Patient not taking: Reported on 07/25/2021)   sertraline (ZOLOFT) 100 MG tablet Take 1 tablet (100 mg total) by mouth daily. (Patient not taking: No sig reported)   No facility-administered medications prior to visit.    Review of Systems  Constitutional:  Negative for appetite change, chills, fatigue and fever.  Respiratory:  Negative for chest tightness and shortness of breath.   Cardiovascular:  Negative for chest pain and palpitations.  Gastrointestinal:  Negative for abdominal pain,  nausea and vomiting.  Neurological:  Negative for dizziness and weakness.      Objective  -------------------------------------------------------------------------------------------------------------------- BP 128/87 (BP Location: Left Arm, Patient Position: Sitting, Cuff Size: Normal)   Pulse 95   Temp 98.6 F (37 C) (Oral)   Ht _0  (1.676 m)   Wt 173 lb 3.2 oz (78.6 kg)   SpO2 97%   BMI 27.96  kg/m  Physical Exam Vitals reviewed.  Constitutional:      Appearance: Normal appearance.  HENT:     Head: Normocephalic and atraumatic.     Right Ear: External ear normal.     Left Ear: External ear normal.  Eyes:     General: No scleral icterus.    Conjunctiva/sclera: Conjunctivae normal.  Cardiovascular:     Rate and Rhythm: Normal rate and regular rhythm.     Pulses: Normal pulses.     Heart sounds: Normal heart sounds.  Pulmonary:     Breath sounds: Normal breath sounds.  Abdominal:     Palpations: Abdomen is soft.  Skin:    General: Skin is warm and dry.  Neurological:     General: No focal deficit present.     Mental Status: She is alert and oriented to person, place, and time.  Psychiatric:        Mood and Affect: Mood normal.        Behavior: Behavior normal.        Thought Content: Thought content normal.      No results found for any visits on 07/25/21.  Assessment & Plan  -------------------------------------------------------------------------------------------------------------------- 1. Weight gain No signs of PCOS. Have started the patient on phentermine as more expensive prescriptions would not be covered without failing basics.  Follow-up 2 months. On reviewing her history this might be a mistake with her history of polysubstance abuse. - CBC with Differential/Platelet - TSH - Comprehensive Metabolic Panel (CMET)  2. Other migraine without status migrainosus, not intractable   3. Severe recurrent major depression without psychotic features ([place]) Severe depression which is met managed now by CBT only.  I am worried this will not be enough although today her mood seems to be good.  She had a history of intentional overdose as ago.    Return in about 3 months (around 10/25/2021).      I, Wilhemena Durie, MD, have reviewed all documentation for this visit. The documentation on 07/29/21 for the exam, diagnosis, procedures, and orders are all  accurate and complete.    Glady Ouderkirk Cranford Mon, MD  [facility] [phone] (phone) [phone] (fax)  [place]

## 2021-07-26 LAB — COMPREHENSIVE METABOLIC PANEL
ALT: 23 IU/L (ref 0–32)
AST: 22 IU/L (ref 0–40)
Albumin/Globulin Ratio: 1.9 (ref 1.2–2.2)
Albumin: 4.1 g/dL (ref 3.8–4.8)
Alkaline Phosphatase: 60 IU/L (ref 44–121)
BUN/Creatinine Ratio: 15 (ref 9–23)
BUN: 12 mg/dL (ref 6–20)
Bilirubin Total: 0.3 mg/dL (ref 0.0–1.2)
CO2: 28 mmol/L (ref 20–29)
Calcium: 9.1 mg/dL (ref 8.7–10.2)
Chloride: 101 mmol/L (ref 96–106)
Creatinine, Ser: 0.78 mg/dL (ref 0.57–1.00)
Globulin, Total: 2.2 g/dL (ref 1.5–4.5)
Glucose: 139 mg/dL — ABNORMAL HIGH (ref 65–99)
Potassium: 3.5 mmol/L (ref 3.5–5.2)
Sodium: 143 mmol/L (ref 134–144)
Total Protein: 6.3 g/dL (ref 6.0–8.5)
eGFR: 103 mL/min/{1.73_m2} (ref >59)

## 2021-07-26 LAB — CBC WITH DIFFERENTIAL/PLATELET
Basophils Absolute: 0 10*3/uL (ref 0.0–0.2)
Basos: 1 %
EOS (ABSOLUTE): 0.3 10*3/uL (ref 0.0–0.4)
Eos: 4 %
Hematocrit: 48.3 % — ABNORMAL HIGH (ref 34.0–46.6)
Hemoglobin: 16.3 g/dL — ABNORMAL HIGH (ref 11.1–15.9)
Immature Grans (Abs): 0 10*3/uL (ref 0.0–0.1)
Immature Granulocytes: 0 %
Lymphocytes Absolute: 1.7 10*3/uL (ref 0.7–3.1)
Lymphs: 28 %
MCH: 30.6 pg (ref 26.6–33.0)
MCHC: 33.7 g/dL (ref 31.5–35.7)
MCV: 91 fL (ref 79–97)
Monocytes Absolute: 0.4 10*3/uL (ref 0.1–0.9)
Monocytes: 8 %
Neutrophils Absolute: 3.5 10*3/uL (ref 1.4–7.0)
Neutrophils: 59 %
Platelets: 161 10*3/uL (ref 150–450)
RBC: 5.33 x10E6/uL — ABNORMAL HIGH (ref 3.77–5.28)
RDW: 12.1 % (ref 11.7–15.4)
WBC: 5.9 10*3/uL (ref 3.4–10.8)

## 2021-07-26 LAB — TSH: TSH: 1.94 u[IU]/mL (ref 0.450–4.500)

## 2021-08-21 ENCOUNTER — Emergency Department: Payer: No Typology Code available for payment source

## 2021-08-21 ENCOUNTER — Emergency Department
Admission: EM | Admit: 2021-08-21 | Discharge: 2021-08-21 | Disposition: A | Discharge status: Home / Self Care | Payer: No Typology Code available for payment source | Attending: Emergency Medicine | Admitting: Emergency Medicine

## 2021-08-21 ENCOUNTER — Ambulatory Visit: Payer: Self-pay | Admitting: *Deleted

## 2021-08-21 ENCOUNTER — Other Ambulatory Visit: Payer: Self-pay

## 2021-08-21 DIAGNOSIS — Z91040 Latex allergy status: Secondary | ICD-10-CM | POA: Insufficient documentation

## 2021-08-21 DIAGNOSIS — Y92410 Unspecified street and highway as the place of occurrence of the external cause: Secondary | ICD-10-CM | POA: Diagnosis not present

## 2021-08-21 DIAGNOSIS — F17210 Nicotine dependence, cigarettes, uncomplicated: Secondary | ICD-10-CM | POA: Insufficient documentation

## 2021-08-21 DIAGNOSIS — M25512 Pain in left shoulder: Secondary | ICD-10-CM | POA: Insufficient documentation

## 2021-08-21 DIAGNOSIS — M542 Cervicalgia: Secondary | ICD-10-CM | POA: Diagnosis not present

## 2021-08-21 MED ORDER — METHOCARBAMOL 500 MG PO TABS: 500.0000 mg | ORAL_TABLET | Freq: Three times a day (TID) | ORAL | 0 refills | Status: AC | PRN

## 2021-08-21 MED ORDER — MELOXICAM 15 MG PO TABS: 15.0000 mg | ORAL_TABLET | Freq: Every day | ORAL | 0 refills | Status: DC

## 2021-08-21 MED ORDER — KETOROLAC TROMETHAMINE 30 MG/ML IJ SOLN
30.0000 mg | Freq: Once | INTRAMUSCULAR | Status: AC
  Administered 2021-08-21: 30 mg via INTRAMUSCULAR
  Filled 2021-08-21: qty 1
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
## 2021-08-21 NOTE — Discharge Instructions (Signed)
Take meloxicam and Robaxin as directed. 
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
## 2021-08-25 ENCOUNTER — Telehealth: Payer: Self-pay

## 2021-08-25 NOTE — Telephone Encounter (Signed)
Patient reports that ED last night. KW
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
## 2021-08-25 NOTE — Telephone Encounter (Signed)
Please advise if you can possible see patient today for injection, please see note below and refer to ED note in chart. There is no other openings today in office if unable to fit in I will refer patient to [facility]. KW
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
## 2021-09-26 ENCOUNTER — Other Ambulatory Visit: Payer: Self-pay

## 2021-09-26 ENCOUNTER — Ambulatory Visit (INDEPENDENT_AMBULATORY_CARE_PROVIDER_SITE_OTHER): Payer: 59 | Admitting: Family Medicine

## 2021-09-26 DIAGNOSIS — G43809 Other migraine, not intractable, without status migrainosus: Secondary | ICD-10-CM | POA: Diagnosis not present

## 2021-09-26 DIAGNOSIS — F314 Bipolar disorder, current episode depressed, severe, without psychotic features: Secondary | ICD-10-CM

## 2021-09-26 DIAGNOSIS — R635 Abnormal weight gain: Secondary | ICD-10-CM

## 2021-09-26 NOTE — Progress Notes (Signed)
Established patient visit   Patient: Terri Holt   DOB: Apr 04, 1991   30 y.o. Female  MRN: [id number] Visit Date: 09/26/2021  Today's healthcare provider: Megan Mans, MD   No chief complaint on file.  Subjective    HPI   Weight Gain-Patient was seen 3 months ago.  She was at that time given Phentermine.  Patient states that for the first 2 weeks the medicine suppressed her appetite but after that it did not do anything and she has not lost any weight. Socially patient states she is doing okay but really wants to lose weight.  She states she is having back pain carrying the extra weight around.  She also has an 38-year-old and 35-year-old sons that keep her moving.  Depression, Follow-up  She  was last seen for this 3 months ago. Changes made at last visit include none.   Patient states she sees a Veterinary surgeon for this and is doing well.  Depression screen [facility] 2/9 09/26/2021 07/25/2021 04/14/2018  Decreased Interest 1 2 1   Down, Depressed, Hopeless 1 1 1   PHQ - 2 Score 2 3 2   Altered sleeping 1 1 3   Tired, decreased energy 1 2 2   Change in appetite 1 1 2   Feeling bad or failure about yourself  1 1 2   Trouble concentrating 1 1 1   Moving slowly or fidgety/restless 0 0 3  Suicidal thoughts 0 0 0  PHQ-9 Score 7 9 15   Difficult doing work/chores Somewhat difficult Somewhat difficult -    -----------------------------------------------------------------------------------------     Medications: Outpatient Medications Prior to Visit  Medication Sig   phentermine 30 MG capsule Take 1 capsule (30 mg total) by mouth every morning.   baclofen (LIORESAL) 10 MG tablet Take 1 tablet (10 mg total) by mouth 3 (three) times daily. (Patient not taking: Reported on 09/26/2021)   buPROPion (WELLBUTRIN SR) 100 MG 12 hr tablet Take 1 tablet (100 mg total) by mouth daily. (Patient not taking: Reported on 09/26/2021)   carbamazepine (TEGRETOL) 200 MG tablet Take 1 tablet (200 mg  total) by mouth 2 (two) times daily at 8 am and 10 pm. (Patient not taking: Reported on 09/26/2021)   hydrOXYzine (ATARAX/VISTARIL) 50 MG tablet Take 1 tablet (50 mg total) by mouth 3 (three) times daily as needed for anxiety. (Patient not taking: Reported on 09/26/2021)   meloxicam (MOBIC) 15 MG tablet Take 1 tablet (15 mg total) by mouth daily. (Patient not taking: Reported on 09/26/2021)   naproxen (NAPROSYN) 500 MG tablet Take 1 tablet (500 mg total) by mouth 2 (two) times daily with a meal. (Patient not taking: Reported on 09/26/2021)   pantoprazole (PROTONIX) 40 MG tablet Take 1 tablet (40 mg total) by mouth daily. (Patient not taking: Reported on 09/26/2021)   predniSONE (DELTASONE) 10 MG tablet Take 6 pills on day 1, 5 pills on day 2 and so on until complete. (Patient not taking: Reported on 09/26/2021)   promethazine (PHENERGAN) 12.5 MG tablet Take 1 tablet (12.5 mg total) by mouth every 8 (eight) hours as needed for nausea or vomiting. (Patient not taking: Reported on 09/26/2021)   sertraline (ZOLOFT) 100 MG tablet Take 1 tablet (100 mg total) by mouth daily. (Patient not taking: Reported on 09/26/2021)   traZODone (DESYREL) 100 MG tablet Take 1 tablet (100 mg total) by mouth at bedtime as needed for sleep. (Patient not taking: Reported on 09/26/2021)   No facility-administered medications prior to visit.  Review of Systems  Respiratory:  Negative for shortness of breath.   Cardiovascular:  Negative for chest pain.  Neurological:  Negative for dizziness, light-headedness and headaches.  Psychiatric/Behavioral:  Negative for confusion, decreased concentration, self-injury and sleep disturbance. The patient is not nervous/anxious and is not hyperactive.        Objective    There were no vitals taken for this visit. BP Readings from Last 3 Encounters:  08/21/21 133/88  07/25/21 128/87  01/31/19 103/62   Wt Readings from Last 3 Encounters:  08/21/21 165 lb (74.8 kg)  07/25/21 173 lb 3.2  oz (78.6 kg)  06/10/19 175 lb (79.4 kg)      Physical Exam Vitals reviewed.  Constitutional:      Appearance: Normal appearance.  HENT:     Head: Normocephalic and atraumatic.     Right Ear: External ear normal.     Left Ear: External ear normal.  Eyes:     General: No scleral icterus.    Conjunctiva/sclera: Conjunctivae normal.  Cardiovascular:     Rate and Rhythm: Normal rate and regular rhythm.     Pulses: Normal pulses.     Heart sounds: Normal heart sounds.  Pulmonary:     Breath sounds: Normal breath sounds.  Abdominal:     Palpations: Abdomen is soft.  Skin:    General: Skin is warm and dry.  Neurological:     General: No focal deficit present.     Mental Status: She is alert and oriented to person, place, and time.  Psychiatric:        Mood and Affect: Mood normal.        Behavior: Behavior normal.        Thought Content: Thought content normal.      No results found for any visits on 09/26/21.  Assessment & Plan     1. Weight gain Completely discontinue phentermine.  He is stopped this anyway. Refer for medical weight management. - Amb Ref to Medical Weight Management  2. Bipolar I disorder, most recent episode depressed, severe without psychotic features (HCC) I think patient psychiatric illness appears to be under control at this time.  Follow-up with psychiatry.  3. Other migraine without status migrainosus, not intractable Clinically stable. Follow-up with new provider in 2023  No follow-ups on file.      I, Megan Mans, MD, have reviewed all documentation for this visit. The documentation on 09/26/21 for the exam, diagnosis, procedures, and orders are all accurate and complete.    Marvina Danner Wendelyn Breslow, MD  [facility] [phone] (phone) [phone] (fax)  [facility]

## 2021-12-07 ENCOUNTER — Encounter: Payer: Self-pay | Admitting: Family Medicine

## 2021-12-07 ENCOUNTER — Other Ambulatory Visit: Payer: Self-pay

## 2021-12-07 ENCOUNTER — Ambulatory Visit (INDEPENDENT_AMBULATORY_CARE_PROVIDER_SITE_OTHER): Payer: 59 | Admitting: Family Medicine

## 2021-12-07 VITALS — BP 124/88 | HR 78 | Resp 16 | Wt 169.9 lb

## 2021-12-07 DIAGNOSIS — E663 Overweight: Secondary | ICD-10-CM

## 2021-12-07 DIAGNOSIS — Z713 Dietary counseling and surveillance: Secondary | ICD-10-CM | POA: Diagnosis not present

## 2021-12-07 DIAGNOSIS — Z716 Tobacco abuse counseling: Secondary | ICD-10-CM | POA: Diagnosis not present

## 2021-12-07 DIAGNOSIS — Z6826 Body mass index (BMI) 26.0-26.9, adult: Secondary | ICD-10-CM | POA: Diagnosis not present

## 2021-12-07 DIAGNOSIS — F314 Bipolar disorder, current episode depressed, severe, without psychotic features: Secondary | ICD-10-CM

## 2021-12-07 DIAGNOSIS — R69 Illness, unspecified: Secondary | ICD-10-CM | POA: Diagnosis not present

## 2021-12-07 MED ORDER — PHENTERMINE HCL 37.5 MG PO CAPS: 37.5000 mg | ORAL_CAPSULE | ORAL | 1 refills | Status: DC

## 2021-12-07 NOTE — Assessment & Plan Note (Signed)
Does not wish to quit at this time; education provided

## 2021-12-07 NOTE — Patient Instructions (Signed)
Check in at 3 months or sooner

## 2021-12-07 NOTE — Assessment & Plan Note (Addendum)
Off all medications  Seeks CBT 2 x per week Committed to safety; denies self harm, SI, HI Contracted to safety Single parent 2 boys Looking for work- has interview tomorrow with [employer]

## 2021-12-07 NOTE — Progress Notes (Signed)
Established patient visit   Patient: Terri Holt   DOB: 10-10-1991   31 y.o. Female  MRN: [id number] Visit Date: 12/07/2021  Today's healthcare provider: Jacky Kindle, FNP   Chief Complaint  Patient presents with   Weight Management    Patient presents in office today to discuss starting back on phentermine. She states that she has tried calorie counting and Keto diet without success. Patient states at last visit she was referred to weight management clinic in [place] and states that they turned her away because they said she was not overweight according to patient. Patient states that she is staying active walking 30 min on tredmill 6x a week and walking dog in the evening.    Subjective    HPI HPI     Weight Management    Additional comments: Patient presents in office today to discuss starting back on phentermine. She states that she has tried calorie counting and Keto diet without success. Patient states at last visit she was referred to weight management clinic in [place] and states that they turned her away because they said she was not overweight according to patient. Patient states that she is staying active walking 30 min on tredmill 6x a week and walking dog in the evening.       Last edited by Fonda Kinder, CMA on 12/07/2021  2:36 PM.       Medications: Outpatient Medications Prior to Visit  Medication Sig   [DISCONTINUED] baclofen (LIORESAL) 10 MG tablet Take 1 tablet (10 mg total) by mouth 3 (three) times daily.   [DISCONTINUED] buPROPion (WELLBUTRIN SR) 100 MG 12 hr tablet Take 1 tablet (100 mg total) by mouth daily.   [DISCONTINUED] carbamazepine (TEGRETOL) 200 MG tablet Take 1 tablet (200 mg total) by mouth 2 (two) times daily at 8 am and 10 pm.   [DISCONTINUED] hydrOXYzine (ATARAX/VISTARIL) 50 MG tablet Take 1 tablet (50 mg total) by mouth 3 (three) times daily as needed for anxiety.   [DISCONTINUED] meloxicam (MOBIC) 15 MG tablet Take  1 tablet (15 mg total) by mouth daily.   [DISCONTINUED] naproxen (NAPROSYN) 500 MG tablet Take 1 tablet (500 mg total) by mouth 2 (two) times daily with a meal.   [DISCONTINUED] pantoprazole (PROTONIX) 40 MG tablet Take 1 tablet (40 mg total) by mouth daily.   [DISCONTINUED] phentermine 30 MG capsule Take 1 capsule (30 mg total) by mouth every morning.   [DISCONTINUED] predniSONE (DELTASONE) 10 MG tablet Take 6 pills on day 1, 5 pills on day 2 and so on until complete.   [DISCONTINUED] promethazine (PHENERGAN) 12.5 MG tablet Take 1 tablet (12.5 mg total) by mouth every 8 (eight) hours as needed for nausea or vomiting.   [DISCONTINUED] sertraline (ZOLOFT) 100 MG tablet Take 1 tablet (100 mg total) by mouth daily.   [DISCONTINUED] traZODone (DESYREL) 100 MG tablet Take 1 tablet (100 mg total) by mouth at bedtime as needed for sleep.   No facility-administered medications prior to visit.    Review of Systems     Objective    BP 124/88    Pulse 78    Resp 16    Wt 169 lb 14.4 oz (77.1 kg)    SpO2 99%    BMI 26.61 kg/m    Physical Exam Vitals and nursing note reviewed.  Constitutional:      General: She is not in acute distress.    Appearance: Normal appearance. She is overweight. She is  not ill-appearing, toxic-appearing or diaphoretic.  HENT:     Head: Normocephalic and atraumatic.  Cardiovascular:     Rate and Rhythm: Normal rate and regular rhythm.     Pulses: Normal pulses.     Heart sounds: Normal heart sounds. No murmur heard.   No friction rub. No gallop.  Pulmonary:     Effort: Pulmonary effort is normal. No respiratory distress.     Breath sounds: Normal breath sounds. No stridor. No wheezing, rhonchi or rales.  Chest:     Chest wall: No tenderness.  Abdominal:     General: Bowel sounds are normal.     Palpations: Abdomen is soft.  Musculoskeletal:        General: No swelling, tenderness, deformity or signs of injury. Normal range of motion.     Right lower leg: No  edema.     Left lower leg: No edema.  Skin:    General: Skin is warm and dry.     Capillary Refill: Capillary refill takes less than 2 seconds.     Coloration: Skin is not jaundiced or pale.     Findings: No bruising, erythema, lesion or rash.  Neurological:     General: No focal deficit present.     Mental Status: She is alert and oriented to person, place, and time. Mental status is at baseline.     Cranial Nerves: No cranial nerve deficit.     Sensory: No sensory deficit.     Motor: No weakness.     Coordination: Coordination normal.  Psychiatric:        Mood and Affect: Mood normal.        Behavior: Behavior normal.        Thought Content: Thought content normal.        Judgment: Judgment normal.     No results found for any visits on 12/07/21.  Assessment & Plan     Patient seen and examined by Merita Norton,  FNP note scribed by Sheliah Hatch, NCMA   No follow-ups on file.      Problem List Items Addressed This Visit       Other   Bipolar I disorder, most recent episode depressed, severe without psychotic features (HCC)    Off all medications  Seeks CBT 2 x per week Committed to safety; denies self harm, SI, HI Contracted to safety Single parent 2 boys Looking for work- has interview tomorrow with [employer]      Overweight with body mass index (BMI) of 26 to 26.9 in adult - Primary    BMI close to 27 Advised close follow up required; require video or in person follow up        Encounter for weight loss counseling    Did not allow time to seek effects from medication Discussed hx of diet Needs at least 1600 cals Encouraged increased water intake Balanced meals Likely high stress d/t solo parent, out of work, two kids      Tobacco abuse counseling    Does not wish to quit at this time; education provided      3 month f/u or sooner   Jacky Kindle, FNP  [facility] [phone] (phone) [phone] (fax)  [facility]

## 2021-12-07 NOTE — Assessment & Plan Note (Signed)
BMI close to 27 Advised close follow up required; require video or in person follow up

## 2021-12-07 NOTE — Assessment & Plan Note (Signed)
Did not allow time to seek effects from medication Discussed hx of diet Needs at least 1600 cals Encouraged increased water intake Balanced meals Likely high stress d/t solo parent, out of work, two kids

## 2022-03-01 DIAGNOSIS — J020 Streptococcal pharyngitis: Secondary | ICD-10-CM | POA: Diagnosis not present

## 2022-03-01 DIAGNOSIS — Z20818 Contact with and (suspected) exposure to other bacterial communicable diseases: Secondary | ICD-10-CM | POA: Diagnosis not present

## 2022-03-01 DIAGNOSIS — J029 Acute pharyngitis, unspecified: Secondary | ICD-10-CM | POA: Diagnosis not present

## 2022-10-04 ENCOUNTER — Encounter: Payer: Self-pay | Admitting: Family Medicine

## 2022-10-04 ENCOUNTER — Ambulatory Visit (INDEPENDENT_AMBULATORY_CARE_PROVIDER_SITE_OTHER): Payer: 59 | Admitting: Family Medicine

## 2022-10-04 VITALS — BP 134/88 | HR 81 | Resp 16 | Ht 66.0 in | Wt 157.0 lb

## 2022-10-04 DIAGNOSIS — F5101 Primary insomnia: Secondary | ICD-10-CM | POA: Diagnosis not present

## 2022-10-04 DIAGNOSIS — R69 Illness, unspecified: Secondary | ICD-10-CM | POA: Diagnosis not present

## 2022-10-04 DIAGNOSIS — R5383 Other fatigue: Secondary | ICD-10-CM | POA: Insufficient documentation

## 2022-10-04 DIAGNOSIS — R002 Palpitations: Secondary | ICD-10-CM | POA: Insufficient documentation

## 2022-10-04 NOTE — Assessment & Plan Note (Signed)
Acute x 4 weeks, has tried OTC medication and previous Rx for trazodone Is able to rest some Denies external stressors Denies SI or HI Denies concern for depression flare

## 2022-10-04 NOTE — Assessment & Plan Note (Signed)
Describes palpitations similar to a "panic attack" can come at any time  Weight loss noted despite infrequent use of phentermine  Denies bleeding, bruising or chronic concerns

## 2022-10-04 NOTE — Assessment & Plan Note (Signed)
Acute x1 month; reports feeling like she could sleep at any point; however, when she lays down she cannot fall asleep Reports "life is good right now, I have a lot of positive things going for me" Unknown cause

## 2022-10-04 NOTE — Progress Notes (Signed)
Established patient visit   Patient: Terri Holt   DOB: 1991/09/27   31 y.o. Female  MRN: [id number] Visit Date: 10/04/2022  Today's healthcare provider: Jacky Kindle, FNP  Re Introduced to nurse practitioner role and practice setting.  All questions answered.  Discussed provider/patient relationship and expectations.   Chief Complaint  Patient presents with   Weight Loss    Patient states she has not noticed a difference since starting phentermine.    Fatigue    Complains of fatigue and insomnia for a month.    Subjective    HPI HPI     Weight Loss    Additional comments: Patient states she has not noticed a difference since starting phentermine.         Fatigue    Additional comments: Complains of fatigue and insomnia for a month.       Last edited by Marlana Salvage, CMA on 10/04/2022  4:13 PM.      Medications: Outpatient Medications Prior to Visit  Medication Sig   [DISCONTINUED] phentermine 37.5 MG capsule Take 1 capsule (37.5 mg total) by mouth every morning.   No facility-administered medications prior to visit.    Review of Systems    Objective    BP 134/88 (BP Location: Right Arm, Patient Position: Sitting, Cuff Size: Normal)   Pulse 81   Resp 16   Ht 5\' 6"  (1.676 m)   Wt 157 lb (71.2 kg)   SpO2 99%   BMI 25.34 kg/m   Physical Exam Vitals and nursing note reviewed.  Constitutional:      General: She is not in acute distress.    Appearance: Normal appearance. She is overweight. She is not ill-appearing, toxic-appearing or diaphoretic.  HENT:     Head: Normocephalic and atraumatic.  Cardiovascular:     Rate and Rhythm: Normal rate and regular rhythm.     Pulses: Normal pulses.     Heart sounds: Normal heart sounds. No murmur heard.    No friction rub. No gallop.  Pulmonary:     Effort: Pulmonary effort is normal. No respiratory distress.     Breath sounds: Normal breath sounds. No stridor. No wheezing, rhonchi or rales.   Chest:     Chest wall: No tenderness.  Musculoskeletal:        General: No swelling, tenderness, deformity or signs of injury. Normal range of motion.     Right lower leg: No edema.     Left lower leg: No edema.  Skin:    General: Skin is warm and dry.     Capillary Refill: Capillary refill takes less than 2 seconds.     Coloration: Skin is not jaundiced or pale.     Findings: No bruising, erythema, lesion or rash.  Neurological:     General: No focal deficit present.     Mental Status: She is alert and oriented to person, place, and time. Mental status is at baseline.     Cranial Nerves: No cranial nerve deficit.     Sensory: No sensory deficit.     Motor: No weakness.     Coordination: Coordination normal.  Psychiatric:        Mood and Affect: Mood normal.        Behavior: Behavior normal.        Thought Content: Thought content normal.        Judgment: Judgment normal.     No results found for any visits  on 10/04/22.  Assessment & Plan     Problem List Items Addressed This Visit       Other   Other fatigue    Acute x1 month; reports feeling like she could sleep at any point; however, when she lays down she cannot fall asleep Reports "life is good right now, I have a lot of positive things going for me" Unknown cause       Relevant Orders   TSH + free T4   CBC with Differential/Platelet   Comprehensive Metabolic Panel (CMET)   B12 and Folate Panel   Vitamin D (25 hydroxy)   Palpitations    Describes palpitations similar to a "panic attack" can come at any time  Weight loss noted despite infrequent use of phentermine  Denies bleeding, bruising or chronic concerns       Relevant Orders   TSH + free T4   CBC with Differential/Platelet   Comprehensive Metabolic Panel (CMET)   B12 and Folate Panel   Vitamin D (25 hydroxy)   Primary insomnia - Primary    Acute x 4 weeks, has tried OTC medication and previous Rx for trazodone Is able to rest some Denies  external stressors Denies SI or HI Denies concern for depression flare       Relevant Orders   TSH + free T4   CBC with Differential/Platelet   Comprehensive Metabolic Panel (CMET)   B12 and Folate Panel   Vitamin D (25 hydroxy)   Return if symptoms worsen or fail to improve.     Leilani Merl, FNP, have reviewed all documentation for this visit. The documentation on 10/04/22 for the exam, diagnosis, procedures, and orders are all accurate and complete.  Jacky Kindle, FNP  [facility] [phone] (phone) [phone] (fax)  [facility]

## 2022-10-05 ENCOUNTER — Other Ambulatory Visit: Payer: Self-pay | Admitting: Family Medicine

## 2022-10-05 LAB — CBC WITH DIFFERENTIAL/PLATELET
Basophils Absolute: 0.1 10*3/uL (ref 0.0–0.2)
Basos: 1 %
EOS (ABSOLUTE): 0.3 10*3/uL (ref 0.0–0.4)
Eos: 3 %
Hematocrit: 42.4 % (ref 34.0–46.6)
Hemoglobin: 14.4 g/dL (ref 11.1–15.9)
Immature Grans (Abs): 0 10*3/uL (ref 0.0–0.1)
Immature Granulocytes: 0 %
Lymphocytes Absolute: 2.7 10*3/uL (ref 0.7–3.1)
Lymphs: 30 %
MCH: 30.5 pg (ref 26.6–33.0)
MCHC: 34 g/dL (ref 31.5–35.7)
MCV: 90 fL (ref 79–97)
Monocytes Absolute: 0.5 10*3/uL (ref 0.1–0.9)
Monocytes: 6 %
Neutrophils Absolute: 5.6 10*3/uL (ref 1.4–7.0)
Neutrophils: 60 %
Platelets: 232 10*3/uL (ref 150–450)
RBC: 4.72 x10E6/uL (ref 3.77–5.28)
RDW: 11.9 % (ref 11.7–15.4)
WBC: 9.2 10*3/uL (ref 3.4–10.8)

## 2022-10-05 LAB — COMPREHENSIVE METABOLIC PANEL
ALT: 26 IU/L (ref 0–32)
AST: 18 IU/L (ref 0–40)
Albumin/Globulin Ratio: 2.2 (ref 1.2–2.2)
Albumin: 4.6 g/dL (ref 3.9–4.9)
Alkaline Phosphatase: 65 IU/L (ref 44–121)
BUN/Creatinine Ratio: 14 (ref 9–23)
BUN: 10 mg/dL (ref 6–20)
Bilirubin Total: 0.4 mg/dL (ref 0.0–1.2)
CO2: 21 mmol/L (ref 20–29)
Calcium: 9.2 mg/dL (ref 8.7–10.2)
Chloride: 101 mmol/L (ref 96–106)
Creatinine, Ser: 0.73 mg/dL (ref 0.57–1.00)
Globulin, Total: 2.1 g/dL (ref 1.5–4.5)
Glucose: 85 mg/dL (ref 70–99)
Potassium: 3.9 mmol/L (ref 3.5–5.2)
Sodium: 138 mmol/L (ref 134–144)
Total Protein: 6.7 g/dL (ref 6.0–8.5)
eGFR: 111 mL/min/{1.73_m2} (ref >59)

## 2022-10-05 LAB — B12 AND FOLATE PANEL
Folate: 7.8 ng/mL (ref >3.0)
Vitamin B-12: 638 pg/mL (ref 232–1245)

## 2022-10-05 LAB — VITAMIN D 25 HYDROXY (VIT D DEFICIENCY, FRACTURES): Vit D, 25-Hydroxy: 29.5 ng/mL — ABNORMAL LOW (ref 30.0–100.0)

## 2022-10-05 LAB — TSH+FREE T4
Free T4: 1.25 ng/dL (ref 0.82–1.77)
TSH: 1.54 u[IU]/mL (ref 0.450–4.500)

## 2022-10-05 MED ORDER — VITAMIN D (ERGOCALCIFEROL) 1.25 MG (50000 UNIT) PO CAPS: 50000.0000 [IU] | ORAL_CAPSULE | ORAL | 1 refills | Status: AC

## 2022-10-05 NOTE — Progress Notes (Signed)
Recommend Vit D supplementation. Otherwise, labs are otherwise stabilized.  Jacky Kindle, FNP  [facility] [address] [place], [place] [address] [phone] (phone) [phone] (fax) [facility]

## 2022-12-14 ENCOUNTER — Ambulatory Visit (INDEPENDENT_AMBULATORY_CARE_PROVIDER_SITE_OTHER): Payer: Managed Care, Other (non HMO) | Admitting: Family Medicine

## 2022-12-14 ENCOUNTER — Encounter: Payer: Self-pay | Admitting: Family Medicine

## 2022-12-14 VITALS — BP 111/81 | HR 93 | Temp 98.5°F | Ht 66.0 in | Wt 161.0 lb

## 2022-12-14 DIAGNOSIS — M25551 Pain in right hip: Secondary | ICD-10-CM

## 2022-12-14 DIAGNOSIS — F314 Bipolar disorder, current episode depressed, severe, without psychotic features: Secondary | ICD-10-CM | POA: Diagnosis not present

## 2022-12-14 DIAGNOSIS — Z6825 Body mass index (BMI) 25.0-25.9, adult: Secondary | ICD-10-CM | POA: Diagnosis not present

## 2022-12-14 DIAGNOSIS — E663 Overweight: Secondary | ICD-10-CM | POA: Diagnosis not present

## 2022-12-14 MED ORDER — CYCLOBENZAPRINE HCL 10 MG PO TABS: 10.0000 mg | ORAL_TABLET | Freq: Three times a day (TID) | ORAL | 0 refills | Status: DC | PRN

## 2022-12-14 MED ORDER — PHENTERMINE HCL 37.5 MG PO CAPS: 37.5000 mg | ORAL_CAPSULE | ORAL | 0 refills | Status: DC

## 2022-12-14 MED ORDER — MELOXICAM 15 MG PO TABS: 15.0000 mg | ORAL_TABLET | Freq: Every day | ORAL | 0 refills | Status: DC

## 2022-12-14 MED ORDER — BUPROPION HCL ER (XL) 150 MG PO TB24: 150.0000 mg | ORAL_TABLET | Freq: Every day | ORAL | 0 refills | Status: DC

## 2022-12-14 NOTE — Assessment & Plan Note (Addendum)
Chronic, stable Patient continues to use therapy to assist with mood disorders and previous drug abuse and hx of suicidal thoughts  Comfortable with restart of wellbutrin to assist phentermine

## 2022-12-14 NOTE — Progress Notes (Signed)
I,Connie R Striblin,acting as a Education administrator for Gwyneth Sprout, FNP.,have documented all relevant documentation on the behalf of Gwyneth Sprout, FNP,as directed by  Gwyneth Sprout, FNP while in the presence of Gwyneth Sprout, FNP.  Established patient visit  Patient: Terri Holt   DOB: 1991-10-10   32 y.o. Female  MRN: [id number] Visit Date: 12/14/2022  Today's healthcare provider: Gwyneth Sprout, FNP  Re Introduced to nurse practitioner role and practice setting.  All questions answered.  Discussed provider/patient relationship and expectations.  Chief Complaint  Patient presents with   Weight Loss   Subjective    HPI  Follow up for Weight Loss  The patient was last seen for this 1 years ago. Changes made at last visit include continuing phentermine. She feels that condition is Worse. She is not having side effects.   Pt stopped taking phentermine due to not seeing results, she has gained all of the weight back, and is requesting to be put on wegovey. Pt participates in regular exercise and eats a clean diet. Pt stopped phentermine 6 months ago.   -----------------------------------------------------------------------------------------   Medications: Outpatient Medications Prior to Visit  Medication Sig   Vitamin D, Ergocalciferol, (DRISDOL) 1.25 MG (50000 UNIT) CAPS capsule Take 1 capsule (50,000 Units total) by mouth every 7 (seven) days.   No facility-administered medications prior to visit.    Review of Systems    Objective    BP 111/81 (BP Location: Right Arm, Patient Position: Sitting, Cuff Size: Normal)   Pulse 93   Temp 98.5 F (36.9 C) (Oral)   Ht 5\' 6"  (1.676 m)   Wt 161 lb (73 kg)   SpO2 100%   BMI 25.99 kg/m   Physical Exam Vitals and nursing note reviewed.  Constitutional:      General: She is not in acute distress.    Appearance: Normal appearance. She is overweight. She is not ill-appearing, toxic-appearing or diaphoretic.  HENT:      Head: Normocephalic and atraumatic.  Cardiovascular:     Rate and Rhythm: Normal rate and regular rhythm.     Pulses: Normal pulses.     Heart sounds: Normal heart sounds. No murmur heard.    No friction rub. No gallop.  Pulmonary:     Effort: Pulmonary effort is normal. No respiratory distress.     Breath sounds: Normal breath sounds. No stridor. No wheezing, rhonchi or rales.  Chest:     Chest wall: No tenderness.  Musculoskeletal:        General: Tenderness present. No swelling, deformity or signs of injury. Normal range of motion.     Right lower leg: No edema.     Left lower leg: No edema.       Legs:     Comments: Deep R external hip base tenderness; denies recent trauma or injury  Skin:    General: Skin is warm and dry.     Capillary Refill: Capillary refill takes less than 2 seconds.     Coloration: Skin is not jaundiced or pale.     Findings: No bruising, erythema, lesion or rash.  Neurological:     General: No focal deficit present.     Mental Status: She is alert and oriented to person, place, and time. Mental status is at baseline.     Cranial Nerves: No cranial nerve deficit.     Sensory: No sensory deficit.     Motor: No weakness.     Coordination:  Coordination normal.  Psychiatric:        Mood and Affect: Mood normal. Affect is flat.        Behavior: Behavior normal.        Thought Content: Thought content normal.        Judgment: Judgment normal.     No results found for any visits on 12/14/22.  Assessment & Plan     Problem List Items Addressed This Visit       Other   Bipolar I disorder, most recent episode depressed, severe without psychotic features ([place])    Chronic, stable Patient continues to use therapy to assist with mood disorders and previous drug abuse and hx of suicidal thoughts  Comfortable with restart of wellbutrin to assist phentermine       Relevant Medications   buPROPion (WELLBUTRIN XL) 150 MG 24 hr tablet   Overweight with body  mass index (BMI) of 25 to 25.9 in adult - Primary    Body mass index is 25.99 kg/m. Endorses concern for worsening mood with weight at current weight "I feel like my body cannot carry this weight. My lower back hurts and I feel uncomfortable moving." Wishes to start phentermine again; common side effects and intolerance noted. Recommend morning dosing with meals to prevent/reduce side effects or intolerance Will use in combination with wellbutrin RTC 3 months      Relevant Medications   phentermine 37.5 MG capsule   buPROPion (WELLBUTRIN XL) 150 MG 24 hr tablet   Pain of right hip    Reports some popping and distal pain in R hip. Negative for sciatica, endorses low back pain, denies trauma or injury.  Slight improvement with NSAIDs Recommend titration from Ibuprofen to Mobic 15 mg; use in combination with OTC APAP at 1000 mg TID. Recommend use of flexeril and stretching exercises to assist Prefers conservative tx at this time; if not improved recommend radiology films and consult with ortho teams      Relevant Medications   cyclobenzaprine (FLEXERIL) 10 MG tablet   meloxicam (MOBIC) 15 MG tablet   Return in about 3 months (around 03/15/2023) for weight mgmt .     Vonna Kotyk, FNP, have reviewed all documentation for this visit. The documentation on 12/14/22 for the exam, diagnosis, procedures, and orders are all accurate and complete.  Gwyneth Sprout, [place] [phone] (phone) [phone] (fax)  [place]

## 2022-12-14 NOTE — Assessment & Plan Note (Signed)
Body mass index is 25.99 kg/m. Endorses concern for worsening mood with weight at current weight "I feel like my body cannot carry this weight. My lower back hurts and I feel uncomfortable moving." Wishes to start phentermine again; common side effects and intolerance noted. Recommend morning dosing with meals to prevent/reduce side effects or intolerance Will use in combination with wellbutrin RTC 3 months

## 2022-12-14 NOTE — Assessment & Plan Note (Signed)
Reports some popping and distal pain in R hip. Negative for sciatica, endorses low back pain, denies trauma or injury.  Slight improvement with NSAIDs Recommend titration from Ibuprofen to Mobic 15 mg; use in combination with OTC APAP at 1000 mg TID. Recommend use of flexeril and stretching exercises to assist Prefers conservative tx at this time; if not improved recommend radiology films and consult with ortho teams

## 2023-01-31 ENCOUNTER — Telehealth: Payer: Medicaid Other | Admitting: Emergency Medicine

## 2023-01-31 DIAGNOSIS — M549 Dorsalgia, unspecified: Secondary | ICD-10-CM

## 2023-01-31 NOTE — Progress Notes (Signed)
Because your symptoms have lasted so long and are worsening, I feel your condition warrants further evaluation and I recommend that you be seen in a face to face visit.  I'd recommend follow-up at one of the urgent care centers below.  You might also benefit from being seen by and orthopedic.  Several orthopedic groups in our area offer walk-in clinics. If you google orthopedics near me, you should be able to make an appointment with one that is convenient for you.   NOTE: There will be NO CHARGE for this eVisit   If you are having a true medical emergency please call 911.        For an urgent face to face visit, [facility] has eight urgent care centers for your convenience:   NEW!! [place] [facility] [place] [facility] [place] Get Driving Directions [id number] [address] [place], [place] [facility] [place] [facility] [place] Get Driving Directions [id number] [place], [place] [address]   [place] [facility] [place] [facility]) Get Driving Directions [id number] [address] [place], [place] [address]  [place] [facility] [place] ([place]) Get Driving Directions [id number] [address] [place],  [place]  [address]  [place] [facility] [place] [facility] at [place] Get Driving Directions  [id number] [phone] [address] [place],  [place] [address]   [facility] Get Driving Directions [id number] [place], [place], [place] [address]   [facility] Get Driving Directions  [id number] [address] [place], [place] [address]   [facility] [place] Get Driving Directions [id number] [address], [place], [place] [address]  Your MyChart E-visit questionnaire answers were reviewed by a board certified advanced  clinical practitioner to complete your personal care plan based on your specific symptoms.  Thank you for using e-Visits.    Approximately 5 minutes was used in reviewing the patient's chart, questionnaire, prescribing medications, and documentation.

## 2023-02-04 ENCOUNTER — Encounter: Payer: Self-pay | Admitting: Family Medicine

## 2023-03-19 NOTE — Progress Notes (Unsigned)
I,J'ya E Diarra Ceja,acting as a scribe for Jacky Kindle, FNP.,have documented all relevant documentation on the behalf of Jacky Kindle, FNP,as directed by  Jacky Kindle, FNP while in the presence of Jacky Kindle, FNP.  Established patient visit  Patient: Terri Holt   DOB: 08-Nov-1991   32 y.o. Female  MRN: [id number] Visit Date: 03/20/2023  Today's healthcare provider: Jacky Kindle, FNP  Re Introduced to nurse practitioner role and practice setting.  All questions answered.  Discussed provider/patient relationship and expectations.  Chief Complaint  Patient presents with   Follow-up   Subjective    HPI  Follow up for Overweight with BMI of 25 to 25.9  The patient was last seen for this 3 months ago. Changes made at last visit include phentermine 37.5 MG capsule and buPROPion (WELLBUTRIN XL) 150 MG 24 hr tablet.  She reports excellent compliance with treatment. She feels that condition is Unchanged. She is not having side effects.   -----------------------------------------------------------------------------------------  Patient says that she recently scheduled an upcoming OBGYN appointment.  Patient complains that she is having difficulty concentrating.  Medications: Outpatient Medications Prior to Visit  Medication Sig   cyclobenzaprine (FLEXERIL) 10 MG tablet Take 1 tablet (10 mg total) by mouth 3 (three) times daily as needed for muscle spasms.   meloxicam (MOBIC) 15 MG tablet Take 1 tablet (15 mg total) by mouth daily.   Vitamin D, Ergocalciferol, (DRISDOL) 1.25 MG (50000 UNIT) CAPS capsule Take 1 capsule (50,000 Units total) by mouth every 7 (seven) days.   [DISCONTINUED] buPROPion (WELLBUTRIN XL) 150 MG 24 hr tablet Take 1 tablet (150 mg total) by mouth daily.   [DISCONTINUED] phentermine 37.5 MG capsule Take 1 capsule (37.5 mg total) by mouth every morning.   No facility-administered medications prior to visit.    Review of Systems    Objective     BP 114/80 (BP Location: Left Arm, Patient Position: Sitting, Cuff Size: Normal)   Pulse 80   Temp 98.4 F (36.9 C) (Oral)   Resp 16   Wt 158 lb 12.8 oz (72 kg)   LMP 03/19/2023 (Exact Date)   SpO2 100%   BMI 25.63 kg/m   Physical Exam Vitals and nursing note reviewed.  Constitutional:      General: She is not in acute distress.    Appearance: Normal appearance. She is overweight. She is not ill-appearing, toxic-appearing or diaphoretic.  HENT:     Head: Normocephalic and atraumatic.  Cardiovascular:     Rate and Rhythm: Normal rate and regular rhythm.     Pulses: Normal pulses.     Heart sounds: Normal heart sounds. No murmur heard.    No friction rub. No gallop.  Pulmonary:     Effort: Pulmonary effort is normal. No respiratory distress.     Breath sounds: Normal breath sounds. No stridor. No wheezing, rhonchi or rales.  Chest:     Chest wall: No tenderness.  Musculoskeletal:        General: No swelling, tenderness, deformity or signs of injury. Normal range of motion.     Right lower leg: No edema.     Left lower leg: No edema.  Skin:    General: Skin is warm and dry.     Capillary Refill: Capillary refill takes less than 2 seconds.     Coloration: Skin is not jaundiced or pale.     Findings: No bruising, erythema, lesion or rash.  Neurological:  General: No focal deficit present.     Mental Status: She is alert and oriented to person, place, and time. Mental status is at baseline.     Cranial Nerves: No cranial nerve deficit.     Sensory: No sensory deficit.     Motor: No weakness.     Coordination: Coordination normal.  Psychiatric:        Attention and Perception: Attention normal.        Mood and Affect: Mood is depressed. Affect is blunt and flat.        Behavior: Behavior normal.        Thought Content: Thought content normal. Thought content does not include homicidal or suicidal ideation. Thought content does not include homicidal or suicidal plan.         Judgment: Judgment normal.     No results found for any visits on 03/20/23.  Assessment & Plan     Problem List Items Addressed This Visit       Other   Concentration deficit - Primary    Chronic, ongoing Patient endorses a lot of stressors Single mom, full time job, full time student, denies SI or HI Reports previous dx of ADD/ADHD as a child ?2018 was her last appt with [facility]; unable to request records at this time given lack of signed consent for records request Will increase wellbutrin from 150 to 300 mg       Overweight with body mass index (BMI) of 25 to 25.9 in adult    Chronic, little improvement with phentermine; recommend stopping given ongoing concerns of concentration deficits and poor sleep patterns in setting of acute on chronic stress       OK to follow up in 6-8 weeks if desired to address titration of wellbutrin from 150-300 mg.    I, Jacky Kindle, FNP, have reviewed all documentation for this visit. The documentation on 03/20/23 for the exam, diagnosis, procedures, and orders are all accurate and complete.  Jacky Kindle, FNP  [facility] [phone] (phone) [phone] (fax)  [facility]

## 2023-03-20 ENCOUNTER — Ambulatory Visit (INDEPENDENT_AMBULATORY_CARE_PROVIDER_SITE_OTHER): Payer: Managed Care, Other (non HMO) | Admitting: Family Medicine

## 2023-03-20 ENCOUNTER — Encounter: Payer: Self-pay | Admitting: Family Medicine

## 2023-03-20 VITALS — BP 114/80 | HR 80 | Temp 98.4°F | Resp 16 | Wt 158.8 lb

## 2023-03-20 DIAGNOSIS — R41840 Attention and concentration deficit: Secondary | ICD-10-CM | POA: Insufficient documentation

## 2023-03-20 DIAGNOSIS — E663 Overweight: Secondary | ICD-10-CM

## 2023-03-20 DIAGNOSIS — Z6825 Body mass index (BMI) 25.0-25.9, adult: Secondary | ICD-10-CM

## 2023-03-20 MED ORDER — BUPROPION HCL ER (XL) 300 MG PO TB24: 300.0000 mg | ORAL_TABLET | Freq: Every day | ORAL | 3 refills | Status: DC

## 2023-03-20 NOTE — Assessment & Plan Note (Signed)
Chronic, ongoing Patient endorses a lot of stressors Single mom, full time job, full time student, denies SI or HI Reports previous dx of ADD/ADHD as a child ?2018 was her last appt with [facility]; unable to request records at this time given lack of signed consent for records request Will increase wellbutrin from 150 to 300 mg

## 2023-03-20 NOTE — Assessment & Plan Note (Signed)
Chronic, little improvement with phentermine; recommend stopping given ongoing concerns of concentration deficits and poor sleep patterns in setting of acute on chronic stress

## 2023-04-16 ENCOUNTER — Other Ambulatory Visit: Payer: Self-pay | Admitting: Family Medicine

## 2023-04-16 DIAGNOSIS — R41840 Attention and concentration deficit: Secondary | ICD-10-CM

## 2023-04-16 NOTE — Progress Notes (Signed)
I,Sha'taria Riddik Senna,acting as a Neurosurgeon for Jacky Kindle, FNP.,have documented all relevant documentation on the behalf of Jacky Kindle, FNP,as directed by  Jacky Kindle, FNP while in the presence of Jacky Kindle, FNP.   Established patient visit  Patient: Terri Holt   DOB: 1990-11-27   32 y.o. Female  MRN: [id number] Visit Date: 04/17/2023  Today's healthcare provider: Jacky Kindle, FNP  Re Introduced to nurse practitioner role and practice setting.  All questions answered.  Discussed provider/patient relationship and expectations.  Subjective    HPI  Patient reports hemorrhoids are more constant now and has been dealing with them since her youngest was born who is now 5. Reports previously using any type of treatment she could find in a store but none truly helped. Feels as if they have healed how they are but then they get worse.   Medications: Outpatient Medications Prior to Visit  Medication Sig   buPROPion (WELLBUTRIN XL) 300 MG 24 hr tablet Take 1 tablet (300 mg total) by mouth daily.   cyclobenzaprine (FLEXERIL) 10 MG tablet Take 1 tablet (10 mg total) by mouth 3 (three) times daily as needed for muscle spasms.   meloxicam (MOBIC) 15 MG tablet Take 1 tablet (15 mg total) by mouth daily.   Vitamin D, Ergocalciferol, (DRISDOL) 1.25 MG (50000 UNIT) CAPS capsule Take 1 capsule (50,000 Units total) by mouth every 7 (seven) days.   No facility-administered medications prior to visit.   Review of Systems  Last CBC Lab Results  Component Value Date   WBC 9.2 10/04/2022   HGB 14.4 10/04/2022   HCT 42.4 10/04/2022   MCV 90 10/04/2022   MCH 30.5 10/04/2022   RDW 11.9 10/04/2022   PLT 232 10/04/2022   Last metabolic panel Lab Results  Component Value Date   GLUCOSE 85 10/04/2022   NA 138 10/04/2022   K 3.9 10/04/2022   CL 101 10/04/2022   CO2 21 10/04/2022   BUN 10 10/04/2022   CREATININE 0.73 10/04/2022   EGFR 111 10/04/2022   CALCIUM 9.2 10/04/2022    PROT 6.7 10/04/2022   ALBUMIN 4.6 10/04/2022   LABGLOB 2.1 10/04/2022   AGRATIO 2.2 10/04/2022   BILITOT 0.4 10/04/2022   ALKPHOS 65 10/04/2022   AST 18 10/04/2022   ALT 26 10/04/2022   ANIONGAP 8 01/30/2019   Last lipids Lab Results  Component Value Date   CHOL 120 01/31/2019   HDL 50 01/31/2019   LDLCALC 41 01/31/2019   TRIG 147 01/31/2019   CHOLHDL 2.4 01/31/2019   Last hemoglobin A1c Lab Results  Component Value Date   HGBA1C 4.7 (L) 01/31/2019   Last thyroid functions Lab Results  Component Value Date   TSH 1.540 10/04/2022       Objective    BP 115/79 (BP Location: Right Arm, Patient Position: Sitting, Cuff Size: Normal)   Pulse 80   Ht 5\' 6"  (1.676 m)   Wt 160 lb 8 oz (72.8 kg)   LMP 03/19/2023 (Exact Date)   SpO2 99%   BMI 25.91 kg/m   BP Readings from Last 3 Encounters:  04/17/23 115/79  03/20/23 114/80  12/14/22 111/81   Wt Readings from Last 3 Encounters:  04/17/23 160 lb 8 oz (72.8 kg)  03/20/23 158 lb 12.8 oz (72 kg)  12/14/22 161 lb (73 kg)   SpO2 Readings from Last 3 Encounters:  04/17/23 99%  03/20/23 100%  12/14/22 100%   Physical Exam Constitutional:  General: She is not in acute distress.    Appearance: Normal appearance. She is normal weight. She is not ill-appearing, toxic-appearing or diaphoretic.  HENT:     Head: Normocephalic and atraumatic.  Cardiovascular:     Rate and Rhythm: Normal rate.  Pulmonary:     Effort: Pulmonary effort is normal.  Musculoskeletal:        General: Normal range of motion.     Cervical back: Normal range of motion.  Skin:    General: Skin is warm and dry.  Neurological:     General: No focal deficit present.     Mental Status: She is alert and oriented to person, place, and time. Mental status is at baseline.  Psychiatric:        Mood and Affect: Mood normal.        Behavior: Behavior normal.        Thought Content: Thought content normal.        Judgment: Judgment normal.     No  results found for any visits on 04/17/23.  Assessment & Plan     Problem List Items Addressed This Visit       Digestive   Residual hemorrhoidal skin tags - Primary    Chronic, variable, occasionally with bleeding from straining/constipation Referrals placed to both gastro and surgery to assist Continue to monitor fiber in diet, increasing water intake, avoid straining/sitting on toilet for long periods Declines topical creams or suppositories at this time       Relevant Orders   Ambulatory referral to Gastroenterology   Ambulatory referral to General Surgery     Other   Back pain    Chronic, request for MRI; previously seen at [facility] in [place] New referral placed as we cannot access their records today at this time       Relevant Orders   Ambulatory referral to Orthopedics   Pain of right hip    Reports some popping and distal pain in R hip. Negative for sciatica, endorses low back pain, denies trauma or injury.   Referral back to [facility] locally to assist with next steps and MRI      Relevant Orders   Ambulatory referral to Orthopedics   Well woman exam    Request to see GYN for PAP smear; referral placed      Relevant Orders   Ambulatory referral to Gynecology   Return if symptoms worsen or fail to improve.     Leilani Merl, FNP, have reviewed all documentation for this visit. The documentation on 04/17/23 for the exam, diagnosis, procedures, and orders are all accurate and complete.  Jacky Kindle, FNP  [facility] [phone] (phone) [phone] (fax)  [facility]

## 2023-04-17 ENCOUNTER — Encounter: Payer: Self-pay | Admitting: Family Medicine

## 2023-04-17 ENCOUNTER — Ambulatory Visit (INDEPENDENT_AMBULATORY_CARE_PROVIDER_SITE_OTHER): Payer: Managed Care, Other (non HMO) | Admitting: Family Medicine

## 2023-04-17 VITALS — BP 115/79 | HR 80 | Ht 66.0 in | Wt 160.5 lb

## 2023-04-17 DIAGNOSIS — K644 Residual hemorrhoidal skin tags: Secondary | ICD-10-CM | POA: Diagnosis not present

## 2023-04-17 DIAGNOSIS — M549 Dorsalgia, unspecified: Secondary | ICD-10-CM | POA: Insufficient documentation

## 2023-04-17 DIAGNOSIS — Z01419 Encounter for gynecological examination (general) (routine) without abnormal findings: Secondary | ICD-10-CM | POA: Diagnosis not present

## 2023-04-17 DIAGNOSIS — M25551 Pain in right hip: Secondary | ICD-10-CM | POA: Diagnosis not present

## 2023-04-17 NOTE — Assessment & Plan Note (Signed)
Chronic, request for MRI; previously seen at [facility] in [place] New referral placed as we cannot access their records today at this time

## 2023-04-17 NOTE — Assessment & Plan Note (Signed)
Chronic, variable, occasionally with bleeding from straining/constipation Referrals placed to both gastro and surgery to assist Continue to monitor fiber in diet, increasing water intake, avoid straining/sitting on toilet for long periods Declines topical creams or suppositories at this time

## 2023-04-17 NOTE — Assessment & Plan Note (Signed)
Request to see GYN for PAP smear; referral placed

## 2023-04-17 NOTE — Assessment & Plan Note (Signed)
Reports some popping and distal pain in R hip. Negative for sciatica, endorses low back pain, denies trauma or injury.   Referral back to [facility] locally to assist with next steps and MRI

## 2023-04-26 ENCOUNTER — Telehealth: Payer: Self-pay | Admitting: Family Medicine

## 2023-04-26 NOTE — Telephone Encounter (Signed)
Shameika calling from [facility] is calling to report that the medical Records Release for the patient is a different last name. Patient's name at this practice is [employer]. Please resend records release form.  Fax- [phone] Phone- [phone]

## 2023-05-03 NOTE — Telephone Encounter (Signed)
Pt called to f/u and ask if we have received any update from [facility].  Please advise.

## 2023-05-07 ENCOUNTER — Other Ambulatory Visit: Payer: Self-pay

## 2023-05-07 ENCOUNTER — Encounter: Payer: Self-pay | Admitting: Physician Assistant

## 2023-05-07 ENCOUNTER — Ambulatory Visit (INDEPENDENT_AMBULATORY_CARE_PROVIDER_SITE_OTHER): Payer: Managed Care, Other (non HMO) | Admitting: Physician Assistant

## 2023-05-07 VITALS — BP 126/88 | HR 84 | Temp 98.3°F | Ht 66.0 in | Wt 156.0 lb

## 2023-05-07 DIAGNOSIS — K649 Unspecified hemorrhoids: Secondary | ICD-10-CM | POA: Diagnosis not present

## 2023-05-07 DIAGNOSIS — K625 Hemorrhage of anus and rectum: Secondary | ICD-10-CM | POA: Diagnosis not present

## 2023-05-07 DIAGNOSIS — K5904 Chronic idiopathic constipation: Secondary | ICD-10-CM

## 2023-05-07 MED ORDER — GOLYTELY 236 G PO SOLR: 4000.0000 mL | Freq: Once | ORAL | 0 refills | Status: AC

## 2023-05-07 NOTE — Progress Notes (Signed)
Celso Amy, PA-C [address]  [address]  [place], [place] [address]  Main: [phone]  Fax: [phone]   Gastroenterology Consultation  Referring Provider:     Jacky Kindle, FNP Primary Care Physician:  Jacky Kindle, FNP Primary Gastroenterologist:  Celso Amy, PA-C / Dr. Lannette Donath  Reason for Consultation:     Hemorrhoids skin tag, rectal bleeding, constipation        HPI:   Terri Holt is a 32 y.o. y/o female referred for consultation & management  by Jacky Kindle, FNP.    Here to evaluate hemorrhoid skin tag.  Patient states she has had hemorrhoids ever since she gave birth to her children.  She reports intermittent episodes of rectal bleeding, rectal pain, and swollen hemorrhoids.  Hemorrhoids have currently decreased and are no longer painful.  She is worried about extra skin tag at the area.  Has chronic constipation for many years.  Typically has bowel movement once or twice per week with hard stools and straining.  She has taken MiraLAX twice daily, stool softener, and OTC laxatives with little benefit.  She has not tried prescription constipation treatment.  She previously saw gastroenterologist Dr. Dow Adolph at [facility] 04/2015 for rectal bleeding, constipation, and abdominal pain.  Also hx of GERD.  She was scheduled for a colonoscopy however she did not do the procedure.  Labs 09/2022 showed normal CBC with hemoglobin 14.4.  Normal TSH and CMP.   Past Medical History:  Diagnosis Date   Anxiety    Bipolar I disorder, most recent episode depressed, severe without psychotic features (HCC)    Constipation    Depression    Headache    Mini stroke    Schizophrenia [facility])     Past Surgical History:  Procedure Laterality Date   CESAREAN SECTION     CESAREAN SECTION N/A 02/26/2018   Procedure: REPEAT CESAREAN SECTION;  Surgeon: Hildred Laser, MD;  Location: [facility] ORS;  Service: Obstetrics;  Laterality: N/A;   WISDOM TOOTH EXTRACTION       Prior to Admission medications   Medication Sig Start Date End Date Taking? Authorizing Provider  buPROPion (WELLBUTRIN XL) 300 MG 24 hr tablet Take 1 tablet (300 mg total) by mouth daily. 03/20/23   Jacky Kindle, FNP  cyclobenzaprine (FLEXERIL) 10 MG tablet Take 1 tablet (10 mg total) by mouth 3 (three) times daily as needed for muscle spasms. 12/14/22   Jacky Kindle, FNP  meloxicam (MOBIC) 15 MG tablet Take 1 tablet (15 mg total) by mouth daily. 12/14/22   Jacky Kindle, FNP  Vitamin D, Ergocalciferol, (DRISDOL) 1.25 MG (50000 UNIT) CAPS capsule Take 1 capsule (50,000 Units total) by mouth every 7 (seven) days. 10/05/22   Jacky Kindle, FNP    Family History  Problem Relation Age of Onset   Migraines Mother    Asthma Brother    Hyperlipidemia Brother    Asthma Maternal Grandmother    Hyperlipidemia Maternal Grandmother    Heart failure Maternal Grandfather    Hyperlipidemia Maternal Grandfather    Heart failure Paternal Grandmother    Cancer Paternal Grandmother        tumor   Asthma Son    Thyroid disease Maternal Aunt    Cancer Maternal Aunt        melanoma     Social History   Tobacco Use   Smoking status: Every Day    Packs/day: 0.50    Years:  4.00    Additional pack years: 0.00    Total pack years: 2.00    Types: Cigarettes    Last attempt to quit: 06/26/2017    Years since quitting: 5.8   Smokeless tobacco: Never  Vaping Use   Vaping Use: Never used  Substance Use Topics   Alcohol use: Yes    Alcohol/week: 16.0 standard drinks of alcohol    Types: 16 Glasses of wine per week    Comment: wine at night, occasionally   Drug use: Not Currently    Types: Amphetamines    Allergies as of 05/07/2023 - Review Complete 04/17/2023  Allergen Reaction Noted   Amoxicillin Hives and Rash 12/19/2015   Penicillins Hives and Rash 12/15/2015   Adhesive [tape]  02/17/2018   Latex Rash 02/17/2018   Nickel Hives and Rash 02/25/2018    Review of Systems:    All  systems reviewed and negative except where noted in HPI.   Physical Exam:  There were no vitals taken for this visit. No LMP recorded. Psych:  Alert and cooperative. Normal mood and affect. General:   Alert,  Well-developed, well-nourished, pleasant and cooperative in NAD Head:  Normocephalic and atraumatic. Eyes:  Sclera clear, no icterus.   Conjunctiva pink. Neck:  Supple; no masses or thyromegaly. Lungs:  Respirations even and unlabored.  Clear throughout to auscultation.   No wheezes, crackles, or rhonchi. No acute distress. Heart:  Regular rate and rhythm; no murmurs, clicks, rubs, or gallops. Abdomen:  Normal bowel sounds.  No bruits.  Soft, and non-distended without masses, hepatosplenomegaly or hernias noted.  No Tenderness.  No guarding or rebound tenderness.    Rectal: Patient declined rectal exam until colonoscopy. Neurologic:  Alert and oriented x3;  grossly normal neurologically. Psych:  Alert and cooperative. Normal mood and affect.  Imaging Studies: No results found.  Assessment and Plan:   Terri Holt is a 32 y.o. y/o female has been referred for   Rectal bleeding  Scheduling Colonoscopy I discussed risks of colonoscopy with patient to include risk of bleeding, colon perforation, and risk of sedation.  Patient expressed understanding and agrees to proceed with colonoscopy.   Chronic constipation  Discussed constipation treatment at length. Recommend High Fiber diet with fruits, vegetables, and whole grains. Drink 64 ounces of Fluids Daily. Gave samples of Linzess 72 mcg QD for 1 week, then 145 mcg QD for 1 week, then 290 mcg QD for 1 week.  She will let me know which dose works best, and then she can call me back for a prescription.   3.   Hemorrhoids  Discussed treatment for hemorrhoids at length.  For External Hemorrhoids: Warm water sitz bath with epsom salt for flare up of external hemorrhoids. Use OTC Preparation H, Tucks Pads, and Witch Hazel  wipes as needed. Discussed referral for Surgery as a last resort if Conservative treatment fails.  For Internal Hemorrhoids: Stressed importance of treating underlying constipation. Avoid Sitting on the toilet for prolonged amount of time. Discussed Internal Hemorrhoid Banding if no improvement with conservative treament.    Follow up in 4 weeks after Colonoscopy.  Celso Amy, PA-C

## 2023-05-10 ENCOUNTER — Other Ambulatory Visit: Payer: Self-pay | Admitting: Family Medicine

## 2023-05-10 DIAGNOSIS — F900 Attention-deficit hyperactivity disorder, predominantly inattentive type: Secondary | ICD-10-CM

## 2023-05-10 MED ORDER — AMPHETAMINE-DEXTROAMPHETAMINE 10 MG PO TABS: 10.0000 mg | ORAL_TABLET | Freq: Two times a day (BID) | ORAL | 0 refills | Status: DC

## 2023-05-10 NOTE — Telephone Encounter (Signed)
Pt is requesting that medication for her ADHD be sent into [facility] [address] [place] She states she signed a release for you to get records from [facility]

## 2023-05-13 ENCOUNTER — Ambulatory Visit: Payer: Managed Care, Other (non HMO) | Admitting: Anesthesiology

## 2023-05-13 ENCOUNTER — Ambulatory Visit
Admission: RE | Admit: 2023-05-13 | Discharge: 2023-05-13 | Disposition: A | Discharge status: Home / Self Care | Payer: Managed Care, Other (non HMO) | Attending: Gastroenterology | Admitting: Gastroenterology

## 2023-05-13 ENCOUNTER — Encounter
Admission: RE | Disposition: A | Discharge status: Home / Self Care | Payer: Self-pay | Source: Home / Self Care | Attending: Gastroenterology

## 2023-05-13 DIAGNOSIS — F909 Attention-deficit hyperactivity disorder, unspecified type: Secondary | ICD-10-CM | POA: Diagnosis not present

## 2023-05-13 DIAGNOSIS — Z87891 Personal history of nicotine dependence: Secondary | ICD-10-CM | POA: Insufficient documentation

## 2023-05-13 DIAGNOSIS — G459 Transient cerebral ischemic attack, unspecified: Secondary | ICD-10-CM | POA: Insufficient documentation

## 2023-05-13 DIAGNOSIS — K644 Residual hemorrhoidal skin tags: Secondary | ICD-10-CM | POA: Diagnosis not present

## 2023-05-13 DIAGNOSIS — F319 Bipolar disorder, unspecified: Secondary | ICD-10-CM | POA: Insufficient documentation

## 2023-05-13 DIAGNOSIS — K625 Hemorrhage of anus and rectum: Secondary | ICD-10-CM | POA: Diagnosis present

## 2023-05-13 DIAGNOSIS — F419 Anxiety disorder, unspecified: Secondary | ICD-10-CM | POA: Diagnosis not present

## 2023-05-13 HISTORY — PX: COLONOSCOPY WITH PROPOFOL: SHX5780

## 2023-05-13 LAB — POCT PREGNANCY, URINE: Preg Test, Ur: NEGATIVE

## 2023-05-13 SURGERY — COLONOSCOPY WITH PROPOFOL
Anesthesia: General

## 2023-05-13 MED ORDER — MIDAZOLAM HCL 2 MG/2ML IJ SOLN
INTRAMUSCULAR | Status: DC | PRN
  Administered 2023-05-13: 2 mg via INTRAVENOUS

## 2023-05-13 MED ORDER — MIDAZOLAM HCL 2 MG/2ML IJ SOLN
INTRAMUSCULAR | Status: AC
  Filled 2023-05-13: qty 2

## 2023-05-13 MED ORDER — PROPOFOL 500 MG/50ML IV EMUL
INTRAVENOUS | Status: DC | PRN
  Administered 2023-05-13: 165 ug/kg/min via INTRAVENOUS

## 2023-05-13 MED ORDER — SODIUM CHLORIDE 0.9 % IV SOLN
INTRAVENOUS | Status: DC
  Administered 2023-05-13: 20 mL/h via INTRAVENOUS

## 2023-05-13 MED ORDER — PROPOFOL 10 MG/ML IV BOLUS
INTRAVENOUS | Status: DC | PRN
  Administered 2023-05-13: 50 mg via INTRAVENOUS

## 2023-05-13 MED ORDER — LIDOCAINE HCL (CARDIAC) PF 100 MG/5ML IV SOSY
PREFILLED_SYRINGE | INTRAVENOUS | Status: DC | PRN
  Administered 2023-05-13: 100 mg via INTRAVENOUS

## 2023-05-13 NOTE — Anesthesia Postprocedure Evaluation (Signed)
Anesthesia Post Note  Patient: Terri Holt  Procedure(s) Performed: COLONOSCOPY WITH PROPOFOL  Patient location during evaluation: PACU Anesthesia Type: General Level of consciousness: awake and alert, oriented and patient cooperative Pain management: pain level controlled Vital Signs Assessment: post-procedure vital signs reviewed and stable Respiratory status: spontaneous breathing, nonlabored ventilation and respiratory function stable Cardiovascular status: blood pressure returned to baseline and stable Postop Assessment: adequate PO intake Anesthetic complications: no   No notable events documented.   Last Vitals:  Vitals:   05/13/23 0728 05/13/23 0845  BP: 106/76 (!) 95/55  Pulse: 90 75  Resp: 18 (!) 22  Temp: (!) 36 C (!) 36.2 C  SpO2: 100% 100%    Last Pain:  Vitals:   05/13/23 0905  TempSrc:   PainSc: 0-No pain                 Reed Breech

## 2023-05-13 NOTE — Anesthesia Procedure Notes (Signed)
Procedure Name: General with mask airway Date/Time: 05/13/2023 8:25 AM  Performed by: Mohammed Kindle, CRNAPre-anesthesia Checklist: Patient identified, Emergency Drugs available, Suction available and Patient being monitored Patient Re-evaluated:Patient Re-evaluated prior to induction Oxygen Delivery Method: Simple face mask Induction Type: IV induction Placement Confirmation: positive ETCO2, CO2 detector and breath sounds checked- equal and bilateral Dental Injury: Teeth and Oropharynx as per pre-operative assessment

## 2023-05-13 NOTE — H&P (Signed)
Arlyss Repress, MD [address]  [address]  [place], [place] [address]  Main: [phone]  Fax: [phone] Pager: [phone]  Primary Care Physician:  Jacky Kindle, FNP Primary Gastroenterologist:  Dr. Arlyss Repress  Pre-Procedure History & Physical: HPI:  Terri Holt is a 32 y.o. female is here for an colonoscopy.   Past Medical History:  Diagnosis Date   Anxiety    Bipolar I disorder, most recent episode depressed, severe without psychotic features (HCC)    Constipation    Depression    Headache    Mini stroke    Schizophrenia [facility])     Past Surgical History:  Procedure Laterality Date   CESAREAN SECTION     CESAREAN SECTION N/A 02/26/2018   Procedure: REPEAT CESAREAN SECTION;  Surgeon: Hildred Laser, MD;  Location: [facility] ORS;  Service: Obstetrics;  Laterality: N/A;   WISDOM TOOTH EXTRACTION      Prior to Admission medications   Medication Sig Start Date End Date Taking? Authorizing Provider  amphetamine-dextroamphetamine (ADDERALL) 10 MG tablet Take 1 tablet (10 mg total) by mouth 2 (two) times daily with a meal. 05/10/23  Yes Merita Norton T, FNP  Vitamin D, Ergocalciferol, (DRISDOL) 1.25 MG (50000 UNIT) CAPS capsule Take 1 capsule (50,000 Units total) by mouth every 7 (seven) days. 10/05/22  Yes Merita Norton T, FNP  buPROPion (WELLBUTRIN XL) 300 MG 24 hr tablet Take 1 tablet (300 mg total) by mouth daily. Patient not taking: Reported on 05/07/2023 03/20/23   Merita Norton T, FNP    Allergies as of 05/07/2023 - Review Complete 05/07/2023  Allergen Reaction Noted   Amoxicillin Hives and Rash 12/19/2015   Penicillins Hives and Rash 12/15/2015   Adhesive [tape]  02/17/2018   Latex Rash 02/17/2018   Nickel Hives and Rash 02/25/2018    Family History  Problem Relation Age of Onset   Migraines Mother    Asthma Brother    Hyperlipidemia Brother    Asthma Maternal Grandmother    Hyperlipidemia Maternal Grandmother    Heart failure Maternal  Grandfather    Hyperlipidemia Maternal Grandfather    Heart failure Paternal Grandmother    Cancer Paternal Grandmother        tumor   Asthma Son    Thyroid disease Maternal Aunt    Cancer Maternal Aunt        melanoma    Social History   Socioeconomic History   Marital status: Divorced    Spouse name: Not on file   Number of children: Not on file   Years of education: Not on file   Highest education level: Associate degree: occupational, Scientist, product/process development, or vocational program  Occupational History   Not on file  Tobacco Use   Smoking status: Every Day    Packs/day: 0.50    Years: 4.00    Additional pack years: 0.00    Total pack years: 2.00    Types: Cigarettes    Last attempt to quit: 06/26/2017    Years since quitting: 5.8   Smokeless tobacco: Never  Vaping Use   Vaping Use: Never used  Substance and Sexual Activity   Alcohol use: Yes    Alcohol/week: 16.0 standard drinks of alcohol    Types: 16 Glasses of wine per week    Comment: wine at night, occasionally   Drug use: Not Currently    Types: Amphetamines   Sexual activity: Yes    Partners: Male    Birth control/protection: None, Surgical  Comment: Pregnant   Other Topics Concern   Not on file  Social History Narrative   ** Merged History Encounter **       Social Determinants of Health   Financial Resource Strain: Low Risk  (03/20/2023)   Overall Financial Resource Strain (CARDIA)    Difficulty of Paying Living Expenses: Not hard at all  Food Insecurity: No Food Insecurity (03/20/2023)   Hunger Vital Sign    Worried About Running Out of Food in the Last Year: Never true    Ran Out of Food in the Last Year: Never true  Transportation Needs: No Transportation Needs (03/20/2023)   PRAPARE - Administrator, Civil Service (Medical): No    Lack of Transportation (Non-Medical): No  Physical Activity: Sufficiently Active (03/20/2023)   Exercise Vital Sign    Days of Exercise per Week: 5 days    Minutes  of Exercise per Session: 30 min  Stress: Stress Concern Present (03/20/2023)   [employer] of Occupational Health - Occupational Stress Questionnaire    Feeling of Stress : Very much  Social Connections: Socially Isolated (03/20/2023)   Social Connection and Isolation Panel [NHANES]    Frequency of Communication with Friends and Family: More than three times a week    Frequency of Social Gatherings with Friends and Family: Twice a week    Attends Religious Services: Never    Database administrator or Organizations: No    Attends Engineer, structural: Not on file    Marital Status: Divorced  Catering manager Violence: Not on file    Review of Systems: See HPI, otherwise negative ROS  Physical Exam: BP 106/76   Pulse 90   Temp (!) 96.8 F (36 C) (Temporal)   Resp 18   Ht 5\' 6"  (1.676 m)   Wt 72.4 kg   SpO2 100%   BMI 25.76 kg/m  General:   Alert,  pleasant and cooperative in NAD Head:  Normocephalic and atraumatic. Neck:  Supple; no masses or thyromegaly. Lungs:  Clear throughout to auscultation.    Heart:  Regular rate and rhythm. Abdomen:  Soft, nontender and nondistended. Normal bowel sounds, without guarding, and without rebound.   Neurologic:  Alert and  oriented x4;  grossly normal neurologically.  Impression/Plan: Terri Holt is here for an colonoscopy to be performed for rectal bleeding  Risks, benefits, limitations, and alternatives regarding  colonoscopy have been reviewed with the patient.  Questions have been answered.  All parties agreeable.   Lannette Donath, MD  05/13/2023, 7:39 AM

## 2023-05-13 NOTE — Anesthesia Preprocedure Evaluation (Addendum)
Anesthesia Evaluation  Patient identified by MRN, date of birth, ID band Patient awake    Reviewed: Allergy & Precautions, NPO status , Patient's Chart, lab work & pertinent test results  History of Anesthesia Complications Negative for: history of anesthetic complications  Airway Mallampati: III   Neck ROM: Full    Dental no notable dental hx.    Pulmonary former smoker (quit 2018)   Pulmonary exam normal breath sounds clear to auscultation       Cardiovascular Exercise Tolerance: Good negative cardio ROS Normal cardiovascular exam Rhythm:Regular Rate:Normal     Neuro/Psych  Headaches PSYCHIATRIC DISORDERS (ADHD) Anxiety Depression Bipolar Disorder Schizophrenia  Right-sided tremor TIA   GI/Hepatic ,GERD  ,,  Endo/Other  negative endocrine ROS    Renal/GU negative Renal ROS     Musculoskeletal   Abdominal   Peds  Hematology negative hematology ROS (+)   Anesthesia Other Findings   Reproductive/Obstetrics                             Anesthesia Physical Anesthesia Plan  ASA: 2  Anesthesia Plan: General   Post-op Pain Management:    Induction: Intravenous  PONV Risk Score and Plan: 2 and Propofol infusion, TIVA and Treatment may vary due to age or medical condition  Airway Management Planned: Natural Airway  Additional Equipment:   Intra-op Plan:   Post-operative Plan:   Informed Consent: I have reviewed the patients History and Physical, chart, labs and discussed the procedure including the risks, benefits and alternatives for the proposed anesthesia with the patient or authorized representative who has indicated his/her understanding and acceptance.       Plan Discussed with: CRNA  Anesthesia Plan Comments: (LMA/GETA backup discussed.  Patient consented for risks of anesthesia including but not limited to:  - adverse reactions to medications - damage to eyes, teeth,  lips or other oral mucosa - nerve damage due to positioning  - sore throat or hoarseness - damage to heart, brain, nerves, lungs, other parts of body or loss of life  Informed patient about role of CRNA in peri- and intra-operative care.  Patient voiced understanding.)        Anesthesia Quick Evaluation

## 2023-05-13 NOTE — Op Note (Signed)
[facility] Patient Name: Terri Holt Procedure Date: 05/13/2023 8:12 AM MRN: [id number] Account #: [id number] Date of Birth: 06-06-1991 Admit Type: Outpatient Age: 32 Room: [facility] ENDO ROOM 4 Gender: Female Note Status: Finalized Instrument Name: Colonoscope [id number] Procedure:             Colonoscopy Indications:           This is the patient's first colonoscopy, Rectal                         bleeding Providers:             Toney Reil MD, MD Referring MD:          Daryl Eastern. Suzie Portela (Referring MD) Medicines:             General Anesthesia Complications:         No immediate complications. Estimated blood loss: None. Procedure:             Pre-Anesthesia Assessment:                        - Prior to the procedure, a History and Physical was                         performed, and patient medications and allergies were                         reviewed. The patient is competent. The risks and                         benefits of the procedure and the sedation options and                         risks were discussed with the patient. All questions                         were answered and informed consent was obtained.                         Patient identification and proposed procedure were                         verified by the physician, the nurse, the                         anesthesiologist, the anesthetist and the technician                         in the pre-procedure area in the procedure room in the                         endoscopy suite. Mental Status Examination: alert and                         oriented. Airway Examination: normal oropharyngeal                         airway and neck mobility. Respiratory Examination:  clear to auscultation. CV Examination: normal.                         Prophylactic Antibiotics: The patient does not require                         prophylactic antibiotics. Prior  Anticoagulants: The                         patient has taken no anticoagulant or antiplatelet                         agents. ASA Grade Assessment: II - A patient with mild                         systemic disease. After reviewing the risks and                         benefits, the patient was deemed in satisfactory                         condition to undergo the procedure. The anesthesia                         plan was to use general anesthesia. Immediately prior                         to administration of medications, the patient was                         re-assessed for adequacy to receive sedatives. The                         heart rate, respiratory rate, oxygen saturations,                         blood pressure, adequacy of pulmonary ventilation, and                         response to care were monitored throughout the                         procedure. The physical status of the patient was                         re-assessed after the procedure.                        After obtaining informed consent, the colonoscope was                         passed under direct vision. Throughout the procedure,                         the patient's blood pressure, pulse, and oxygen                         saturations were monitored continuously. The  Colonoscope was introduced through the anus and                         advanced to the the cecum, identified by appendiceal                         orifice and ileocecal valve. The colonoscopy was                         performed without difficulty. The patient tolerated                         the procedure well. The quality of the bowel                         preparation was evaluated using the BBPS [facility] Bowel                         Preparation Scale) with scores of: Right Colon = 3,                         Transverse Colon = 3 and Left Colon = 3 (entire mucosa                         seen well with no residual  staining, small fragments                         of stool or opaque liquid). The total BBPS score                         equals 9. The ileocecal valve, appendiceal orifice,                         and rectum were photographed. Findings:      Skin tags were found on perianal exam.      Non-bleeding external hemorrhoids were found during retroflexion. The       hemorrhoids were small.      The entire examined colon appeared normal. Impression:            - Perianal skin tags found on perianal exam.                        - Non-bleeding external hemorrhoids.                        - The entire examined colon is normal.                        - No specimens collected. Recommendation:        - Discharge patient to home (with escort).                        - Resume previous diet today.                        - Continue present medications.                        -  Repeat colonoscopy in 10 years for screening                         purposes. Procedure Code(s):     --- Professional ---                        [phone], Colonoscopy, flexible; diagnostic, including                         collection of specimen(s) by brushing or washing, when                         performed (separate procedure) Diagnosis Code(s):     --- Professional ---                        K64.4, Residual hemorrhoidal skin tags                        K62.5, Hemorrhage of anus and rectum CPT copyright 2022 American Medical Association. All rights reserved. The codes documented in this report are preliminary and upon coder review may  be revised to meet current compliance requirements. Dr. Libby Maw Toney Reil MD, MD 05/13/2023 8:42:30 AM This report has been signed electronically. Number of Addenda: 0 Note Initiated On: 05/13/2023 8:12 AM Scope Withdrawal Time: 0 hours 9 minutes 11 seconds  Total Procedure Duration: 0 hours 13 minutes 36 seconds  Estimated Blood Loss:  Estimated blood loss: none.       [facility]

## 2023-05-13 NOTE — Transfer of Care (Signed)
Immediate Anesthesia Transfer of Care Note  Patient: Terri Holt  Procedure(s) Performed: COLONOSCOPY WITH PROPOFOL  Patient Location: Endoscopy Unit  Anesthesia Type:General  Level of Consciousness: drowsy and patient cooperative  Airway & Oxygen Therapy: Patient Spontanous Breathing and Patient connected to face mask oxygen  Post-op Assessment: Report given to RN and Post -op Vital signs reviewed and stable  Post vital signs: Reviewed and stable  Last Vitals:  Vitals Value Taken Time  BP 95/55 05/13/23 0845  Temp 36.2 C 05/13/23 0845  Pulse 75 05/13/23 0846  Resp 22 05/13/23 0846  SpO2 100 % 05/13/23 0846  Vitals shown include unvalidated device data.  Last Pain:  Vitals:   05/13/23 0845  TempSrc: Temporal  PainSc: Asleep         Complications: No notable events documented.

## 2023-05-14 ENCOUNTER — Encounter: Payer: Self-pay | Admitting: Gastroenterology

## 2023-05-17 ENCOUNTER — Telehealth: Payer: Self-pay | Admitting: Family Medicine

## 2023-05-17 NOTE — Telephone Encounter (Signed)
Duplicate encounter.  You5 hours ago (10:06 AM)    Records from [facility] scanned into media on 04/29/23 Rx for ADHD sent to pharmacy on 05/10/23 [facility] [address] [facility] [place], [place] Patient LOV 04/17/23   LVMTCB.CRM created. Ok for [facility] to advise patient of information listed above and clarify if any further assistance or information is needed.

## 2023-05-17 NOTE — Telephone Encounter (Signed)
Terri Holt A4 hours ago (10:49 AM)    Pt is calling to check if medical records were received [facility]. Advised that medical records where received 04/29/23. Patient would like script to be sent Vyanse.    Patient reports that she did put a request in Mychart. And has not heard a response.   [phone] [place], [place] - [address] Phone: [phone]  Fax: [phone]      Please Advise CB- [phone]   Patient would like to know if [name] can write the script. Please advise

## 2023-05-17 NOTE — Telephone Encounter (Signed)
The patient would like to be contacted regarding their previously discussed records and prescription coordination   Please contact further when possible

## 2023-05-17 NOTE — Telephone Encounter (Signed)
Original encounter sent. Asking to be advised about vyvanse

## 2023-05-17 NOTE — Telephone Encounter (Signed)
Records from [facility] scanned into media on 04/29/23 Rx for ADHD sent to pharmacy on 05/10/23 [facility] [address] [facility] [place], [place] Patient LOV 04/17/23  LVMTCB.CRM created. Ok for [facility] to advise patient of information listed above and clarify if any further assistance or information is needed.

## 2023-05-17 NOTE — Telephone Encounter (Signed)
Pt is calling to check if medical records were received [facility]. Advised that medical records where received 04/29/23. Patient would like script to be sent Vyanse.   Patient reports that she did put a request in Mychart. And has not heard a response.  [phone] [place], [place] - [address] Phone: [phone]  Fax: [phone]    Please Advise CB- [phone]  Patient would like to know if [name] can write the script. Please advise

## 2023-05-17 NOTE — Telephone Encounter (Signed)
Please advise in regards to [facility]

## 2023-05-20 NOTE — Telephone Encounter (Signed)
Pt called in , I spoke with front desk and was told this would be marked urgent for another Dr to look at her request for the Vyvanse to be refilled aine Dr Suzie Portela is out. She says she had been dealing with this for a month, also with getting a hold of [facility] who has sent her records to [place] and they have been received. Please assist further asap.

## 2023-05-20 NOTE — Telephone Encounter (Signed)
Pt called in , I spoke with front desk and was told this would be marked urgent for another Dr to look at her request for the Vyvanse to be refilled aine Dr Suzie Portela is out. She says she had been dealing with this for a month, also with getting a hold of [facility] who has sent her records to [place] and they have been received. Please assist further asap.Marland KitchenMarland KitchenPer [facility]   Dr. Beryle Flock can you assist in this medication for the patient.

## 2023-05-20 NOTE — Telephone Encounter (Signed)
Patient called in requesting an update for a refill on her VyVanse medication, patient stated she has an appointment in [place] on Friday she would like to cancel if covering provider can refill this medication for her, due to her not wanting to miss anymore work. Patient stated Terri Norton, FNP, said this medication could be filled with receiving those records from [facility]. Please Advise.   VyVanse   [id number] [place], [place] - [address]  Phone: [phone] Fax: [phone]   Patients callback #: [phone]

## 2023-05-21 NOTE — Telephone Encounter (Signed)
Recommend that she see her specialist this one more time and let PCP review records and determine whether she will be prescribing this med and taking over management when she is back in the office next week.

## 2023-05-21 NOTE — Telephone Encounter (Signed)
Patient advised. She reports she has not been able to get specialist to answer calls. I advised to call pharmacy and send a refill request to them.

## 2023-06-05 NOTE — Progress Notes (Unsigned)
PCP:  Jacky Kindle, FNP   No chief complaint on file.    HPI:      [name] is a 32 y.o. [id number] whose LMP was No LMP recorded., presents today for her NP annual examination.  Her menses are {norm/abn:715}, lasting {number: 22536} days.  Dysmenorrhea {dysmen:716}. She {does:18564} have intermenstrual bleeding.  Sex activity: {sex active: 315163}.  Last Pap: 08/21/17 Results were: {norm/abn:16707::"no abnormalities"} /neg HPV DNA *** Hx of STDs: {STD hx:14358}  Last mammogram: {date:304500300}  Results were: {norm/abn:13465} There is no FH of breast cancer. There is no FH of ovarian cancer. The patient {does:18564} do self-breast exams.  Tobacco use: {tob:20664} Alcohol use: {Alcohol:11675} No drug use.  Exercise: {exercise:31265}  She {does:18564} get adequate calcium and Vitamin D in her diet.  Patient Active Problem List   Diagnosis Date Noted   Rectal bleeding 05/13/2023   Residual hemorrhoidal skin tags 04/17/2023   Well woman exam 04/17/2023   Back pain 04/17/2023   Concentration deficit 03/20/2023   Pain of right hip 12/14/2022   Bipolar I disorder, most recent episode depressed, severe without psychotic features (HCC) 01/31/2019   History of migraine headaches 08/03/2015    Past Surgical History:  Procedure Laterality Date   CESAREAN SECTION     CESAREAN SECTION N/A 02/26/2018   Procedure: REPEAT CESAREAN SECTION;  Surgeon: Hildred Laser, MD;  Location: [facility] ORS;  Service: Obstetrics;  Laterality: N/A;   COLONOSCOPY WITH PROPOFOL N/A 05/13/2023   Procedure: COLONOSCOPY WITH PROPOFOL;  Surgeon: Toney Reil, MD;  Location: [facility] ENDOSCOPY;  Service: Gastroenterology;  Laterality: N/A;   WISDOM TOOTH EXTRACTION      Family History  Problem Relation Age of Onset   Migraines Mother    Asthma Brother    Hyperlipidemia Brother    Asthma Maternal Grandmother    Hyperlipidemia Maternal Grandmother    Heart failure Maternal Grandfather     Hyperlipidemia Maternal Grandfather    Heart failure Paternal Grandmother    Cancer Paternal Grandmother        tumor   Asthma Son    Thyroid disease Maternal Aunt    Cancer Maternal Aunt        melanoma    Social History   Socioeconomic History   Marital status: Divorced    Spouse name: Not on file   Number of children: Not on file   Years of education: Not on file   Highest education level: Associate degree: occupational, Scientist, product/process development, or vocational program  Occupational History   Not on file  Tobacco Use   Smoking status: Every Day    Packs/day: 0.50    Years: 4.00    Additional pack years: 0.00    Total pack years: 2.00    Types: Cigarettes    Last attempt to quit: 06/26/2017    Years since quitting: 5.9   Smokeless tobacco: Never  Vaping Use   Vaping Use: Never used  Substance and Sexual Activity   Alcohol use: Yes    Alcohol/week: 16.0 standard drinks of alcohol    Types: 16 Glasses of wine per week    Comment: wine at night, occasionally   Drug use: Not Currently    Types: Amphetamines   Sexual activity: Yes    Partners: Male    Birth control/protection: None, Surgical    Comment: Pregnant   Other Topics Concern   Not on file  Social History Narrative   ** Merged History Encounter **  Social Determinants of Health   Financial Resource Strain: Low Risk  (03/20/2023)   Overall Financial Resource Strain (CARDIA)    Difficulty of Paying Living Expenses: Not hard at all  Food Insecurity: No Food Insecurity (03/20/2023)   Hunger Vital Sign    Worried About Running Out of Food in the Last Year: Never true    Ran Out of Food in the Last Year: Never true  Transportation Needs: No Transportation Needs (03/20/2023)   PRAPARE - Administrator, Civil Service (Medical): No    Lack of Transportation (Non-Medical): No  Physical Activity: Sufficiently Active (03/20/2023)   Exercise Vital Sign    Days of Exercise per Week: 5 days    Minutes of Exercise per  Session: 30 min  Stress: Stress Concern Present (03/20/2023)   [employer] of Occupational Health - Occupational Stress Questionnaire    Feeling of Stress : Very much  Social Connections: Socially Isolated (03/20/2023)   Social Connection and Isolation Panel [NHANES]    Frequency of Communication with Friends and Family: More than three times a week    Frequency of Social Gatherings with Friends and Family: Twice a week    Attends Religious Services: Never    Database administrator or Organizations: No    Attends Engineer, structural: Not on file    Marital Status: Divorced  Catering manager Violence: Not on file     Current Outpatient Medications:    amphetamine-dextroamphetamine (ADDERALL) 10 MG tablet, Take 1 tablet (10 mg total) by mouth 2 (two) times daily with a meal., Disp: 60 tablet, Rfl: 0   Vitamin D, Ergocalciferol, (DRISDOL) 1.25 MG (50000 UNIT) CAPS capsule, Take 1 capsule (50,000 Units total) by mouth every 7 (seven) days., Disp: 26 capsule, Rfl: 1     ROS:  Review of Systems BREAST: No symptoms   Objective: There were no vitals taken for this visit.   OBGyn Exam  Results: No results found for this or any previous visit (from the past 24 hour(s)).  Assessment/Plan: No diagnosis found.  No orders of the defined types were placed in this encounter.            GYN counsel {counseling: 16159}     F/U  No follow-ups on file.  Mildreth Reek B. Shereese Bonnie, PA-C 06/05/2023 8:10 PM

## 2023-06-06 ENCOUNTER — Other Ambulatory Visit (HOSPITAL_COMMUNITY)
Admission: RE | Admit: 2023-06-06 | Discharge: 2023-06-06 | Disposition: A | Discharge status: Home / Self Care | Payer: Managed Care, Other (non HMO) | Source: Ambulatory Visit | Attending: Obstetrics and Gynecology | Admitting: Obstetrics and Gynecology

## 2023-06-06 ENCOUNTER — Encounter: Payer: Self-pay | Admitting: Obstetrics and Gynecology

## 2023-06-06 ENCOUNTER — Ambulatory Visit: Payer: Managed Care, Other (non HMO) | Admitting: Obstetrics and Gynecology

## 2023-06-06 VITALS — BP 98/70 | Ht 66.0 in | Wt 154.0 lb

## 2023-06-06 DIAGNOSIS — Z124 Encounter for screening for malignant neoplasm of cervix: Secondary | ICD-10-CM

## 2023-06-06 DIAGNOSIS — Z1151 Encounter for screening for human papillomavirus (HPV): Secondary | ICD-10-CM | POA: Diagnosis present

## 2023-06-06 DIAGNOSIS — Z01419 Encounter for gynecological examination (general) (routine) without abnormal findings: Secondary | ICD-10-CM

## 2023-06-06 NOTE — Patient Instructions (Signed)
I value your feedback and you entrusting us with your care. If you get a [facility] patient survey, I would appreciate you taking the time to let us know about your experience today. Thank you! ? ? ?

## 2023-06-10 LAB — CYTOLOGY - PAP
Comment: NEGATIVE
Diagnosis: NEGATIVE
High risk HPV: NEGATIVE

## 2023-06-21 ENCOUNTER — Ambulatory Visit: Payer: Managed Care, Other (non HMO) | Admitting: Physician Assistant

## 2023-07-03 ENCOUNTER — Other Ambulatory Visit: Payer: Self-pay | Admitting: Family Medicine

## 2023-07-03 DIAGNOSIS — F900 Attention-deficit hyperactivity disorder, predominantly inattentive type: Secondary | ICD-10-CM

## 2023-07-03 NOTE — Telephone Encounter (Unsigned)
Copied from CRM [id number]. Topic: General - Other >> Jul 03, 2023  1:15 PM Everette C wrote: Reason for CRM: Medication Refill - Medication: amphetamine-dextroamphetamine (ADDERALL) 10 MG tablet [[id number]] - patient has tablet 1 remaining   Has the patient contacted their pharmacy? Yes.   (Agent: If no, request that the patient contact the pharmacy for the refill. If patient does not wish to contact the pharmacy document the reason why and proceed with request.) (Agent: If yes, when and what did the pharmacy advise?)  Preferred Pharmacy (with phone number or street name): [phone] [facility]ell Halim [address] [place] [address] Phone: [phone] Fax: [phone] Hours: Not open 24 hours   Has the patient been seen for an appointment in the last year OR does the patient have an upcoming appointment? Yes.    Agent: Please be advised that RX refills may take up to 3 business days. We ask that you follow-up with your pharmacy.

## 2023-07-04 MED ORDER — AMPHETAMINE-DEXTROAMPHETAMINE 10 MG PO TABS
10.0000 mg | ORAL_TABLET | Freq: Two times a day (BID) | ORAL | 0 refills | Status: AC
Start: 2023-07-04 — End: ?

## 2023-07-04 NOTE — Telephone Encounter (Signed)
Requested medications are due for refill today.  yes  Requested medications are on the active medications list.  yes  Last refill. 05/10/2023 #60 0 rf  Future visit scheduled.   no  Notes to clinic.  Refill not delegated.    Requested Prescriptions  Pending Prescriptions Disp Refills   amphetamine-dextroamphetamine (ADDERALL) 10 MG tablet 60 tablet 0    Sig: Take 1 tablet (10 mg total) by mouth 2 (two) times daily with a meal.     Not Delegated - Psychiatry:  Stimulants/ADHD Failed - 07/03/2023  2:14 PM      Failed - This refill cannot be delegated      Failed - Urine Drug Screen completed in last 360 days      Passed - Last BP in normal range    BP Readings from Last 1 Encounters:  06/06/23 98/70         Passed - Last Heart Rate in normal range    Pulse Readings from Last 1 Encounters:  05/13/23 75         Passed - Valid encounter within last 6 months    Recent Outpatient Visits           2 months ago Residual hemorrhoidal skin tags   [facility] Merita Norton T, FNP   3 months ago Concentration deficit   [facility] Merita Norton T, FNP   6 months ago Overweight with body mass index (BMI) of 25 to 25.9 in adult   [facility] Jacky Kindle, FNP   9 months ago Primary insomnia   [facility] Merita Norton T, FNP   1 year ago Overweight with body mass index (BMI) of 26 to 26.9 in adult   [facility] Jacky Kindle, FNP

## 2023-08-27 ENCOUNTER — Other Ambulatory Visit: Payer: Self-pay | Admitting: Family Medicine

## 2023-08-27 DIAGNOSIS — F900 Attention-deficit hyperactivity disorder, predominantly inattentive type: Secondary | ICD-10-CM

## 2023-08-27 NOTE — Telephone Encounter (Signed)
Requested medications are due for refill today.  yes  Requested medications are on the active medications list.  yes  Last refill. 07/04/2023 #60 0 rf  Future visit scheduled.   no  Notes to clinic.  Refill not delegated.    Requested Prescriptions  Pending Prescriptions Disp Refills   amphetamine-dextroamphetamine (ADDERALL) 10 MG tablet 60 tablet 0    Sig: Take 1 tablet (10 mg total) by mouth 2 (two) times daily with a meal.     Not Delegated - Psychiatry:  Stimulants/ADHD Failed - 08/27/2023  8:58 AM      Failed - This refill cannot be delegated      Failed - Urine Drug Screen completed in last 360 days      Passed - Last BP in normal range    BP Readings from Last 1 Encounters:  06/06/23 98/70         Passed - Last Heart Rate in normal range    Pulse Readings from Last 1 Encounters:  05/13/23 75         Passed - Valid encounter within last 6 months    Recent Outpatient Visits           4 months ago Residual hemorrhoidal skin tags   [facility] Merita Norton T, FNP   5 months ago Concentration deficit   [facility] Merita Norton T, FNP   8 months ago Overweight with body mass index (BMI) of 25 to 25.9 in adult   [facility] Jacky Kindle, FNP   10 months ago Primary insomnia   [facility] Merita Norton T, FNP   1 year ago Overweight with body mass index (BMI) of 26 to 26.9 in adult   [facility] Jacky Kindle, FNP

## 2023-08-27 NOTE — Telephone Encounter (Signed)
Requested medication (s) are due for refill today: yes   Requested medication (s) are on the active medication list: yes   Last refill:  07/04/23 #60 0 refills   Future visit scheduled: no   Notes to clinic:  not delegated per protocol. Do you want to refill Rx?     Requested Prescriptions  Pending Prescriptions Disp Refills   amphetamine-dextroamphetamine (ADDERALL) 10 MG tablet 60 tablet 0    Sig: Take 1 tablet (10 mg total) by mouth 2 (two) times daily with a meal.     Not Delegated - Psychiatry:  Stimulants/ADHD Failed - 08/27/2023  8:58 AM      Failed - This refill cannot be delegated      Failed - Urine Drug Screen completed in last 360 days      Passed - Last BP in normal range    BP Readings from Last 1 Encounters:  06/06/23 98/70         Passed - Last Heart Rate in normal range    Pulse Readings from Last 1 Encounters:  05/13/23 75         Passed - Valid encounter within last 6 months    Recent Outpatient Visits           4 months ago Residual hemorrhoidal skin tags   [facility] Merita Norton T, FNP   5 months ago Concentration deficit   [facility] Merita Norton T, FNP   8 months ago Overweight with body mass index (BMI) of 25 to 25.9 in adult   [facility] Jacky Kindle, FNP   10 months ago Primary insomnia    [facility] Merita Norton T, FNP   1 year ago Overweight with body mass index (BMI) of 26 to 26.9 in adult   [facility] Jacky Kindle, FNP

## 2023-08-27 NOTE — Telephone Encounter (Signed)
Medication Refill - Medication: amphetamine-dextroamphetamine (ADDERALL) 10 MG tablet   Has the patient contacted their pharmacy? Yes.   (Agent: If no, request that the patient contact the pharmacy for the refill. If patient does not wish to contact the pharmacy document the reason why and proceed with request.) (Agent: If yes, when and what did the pharmacy advise?)  Preferred Pharmacy (with phone number or street name):  [id number] Halim [address] Phone: [phone]  Fax: [phone]     Has the patient been seen for an appointment in the last year OR does the patient have an upcoming appointment? Yes.    Agent: Please be advised that RX refills may take up to 3 business days. We ask that you follow-up with your pharmacy.

## 2023-08-28 MED ORDER — AMPHETAMINE-DEXTROAMPHETAMINE 10 MG PO TABS
10.0000 mg | ORAL_TABLET | Freq: Two times a day (BID) | ORAL | 0 refills | Status: DC
Start: 2023-08-28 — End: 2023-10-14

## 2023-10-14 ENCOUNTER — Telehealth: Payer: Self-pay | Admitting: Family Medicine

## 2023-10-14 DIAGNOSIS — F900 Attention-deficit hyperactivity disorder, predominantly inattentive type: Secondary | ICD-10-CM

## 2023-10-14 NOTE — Telephone Encounter (Signed)
I spoke with patient to get clarification.  She states she used to be on Vyvanse and that is what she wants to take.  I can not find where anyone has prescribed it for her.  There is a note from 2018 under Anola Gurney a PA that used to work here.  In his note it says she tried one of her friends Vyvanse but he did not prescribe it then.  She says [facility] prescribed it for her but I can't find it in those records.    Copied from CRM [id number]. Topic: General - Other >> Oct 14, 2023 11:27 AM Haroldine Laws wrote: Reason for CRM: pt called asking why she wasn't prescribed the Vyvance instead of the adderall.  She said that is what she had been taking before and it was better for her.  CB@  [phone]

## 2023-10-14 NOTE — Telephone Encounter (Signed)
Medication Refill - Adderall 10 mg Most Recent Primary Care Visit:  Provider: Merita Norton T  Department: [facility]  Visit Type: OFFICE VISIT  Date: 04/17/2023  Medication: Adderall 10 mg  Has the patient contacted their pharmacy? Yes (Agent: If no, request that the patient contact the pharmacy for the refill. If patient does not wish to contact the pharmacy document the reason why and proceed with request.) (Agent: If yes, when and what did the pharmacy advise?)  Is this the correct pharmacy for this prescription? Yes If no, delete pharmacy and type the correct one.  This is the patient's preferred pharmacy:  [id number] - [address] [place] [address] Phone: [phone] Fax: [phone]   Has the prescription been filled recently? Yes  Is the patient out of the medication? Yes  Has the patient been seen for an appointment in the last year OR does the patient have an upcoming appointment? Yes  Can we respond through MyChart? Yes  Agent: Please be advised that Rx refills may take up to 3 business days. We ask that you follow-up with your pharmacy.

## 2023-10-15 MED ORDER — AMPHETAMINE-DEXTROAMPHETAMINE 10 MG PO TABS: 10.0000 mg | ORAL_TABLET | Freq: Two times a day (BID) | ORAL | 0 refills | Status: DC

## 2023-10-15 NOTE — Telephone Encounter (Signed)
Requested medication (s) are due for refill today - yes  Requested medication (s) are on the active medication list -yes  Future visit scheduled -no  Last refill: 08/28/23 #60  Notes to clinic: non delegated Rx- may not be medication what patient wants- see note  Requested Prescriptions  Pending Prescriptions Disp Refills   amphetamine-dextroamphetamine (ADDERALL) 10 MG tablet 60 tablet 0    Sig: Take 1 tablet (10 mg total) by mouth 2 (two) times daily with a meal.     Not Delegated - Psychiatry:  Stimulants/ADHD Failed - 10/14/2023  2:56 PM      Failed - This refill cannot be delegated      Failed - Urine Drug Screen completed in last 360 days      Passed - Last BP in normal range    BP Readings from Last 1 Encounters:  06/06/23 98/70         Passed - Last Heart Rate in normal range    Pulse Readings from Last 1 Encounters:  05/13/23 75         Passed - Valid encounter within last 6 months    Recent Outpatient Visits           6 months ago Residual hemorrhoidal skin tags   [facility] Merita Norton T, FNP   6 months ago Concentration deficit   [facility] Merita Norton T, FNP   10 months ago Overweight with body mass index (BMI) of 25 to 25.9 in adult   [facility] Merita Norton T, FNP   1 year ago Primary insomnia   [facility] Merita Norton T, FNP   1 year ago Overweight with body mass index (BMI) of 26 to 26.9 in adult   [facility] Merita Norton T, FNP                 Requested Prescriptions  Pending Prescriptions Disp Refills   amphetamine-dextroamphetamine (ADDERALL) 10 MG tablet 60 tablet 0    Sig: Take 1 tablet (10 mg total) by mouth 2 (two) times daily with a meal.     Not Delegated - Psychiatry:  Stimulants/ADHD Failed - 10/14/2023  2:56 PM      Failed - This refill cannot be delegated      Failed - Urine Drug  Screen completed in last 360 days      Passed - Last BP in normal range    BP Readings from Last 1 Encounters:  06/06/23 98/70         Passed - Last Heart Rate in normal range    Pulse Readings from Last 1 Encounters:  05/13/23 75         Passed - Valid encounter within last 6 months    Recent Outpatient Visits           6 months ago Residual hemorrhoidal skin tags   [facility] Merita Norton T, FNP   6 months ago Concentration deficit   [facility] Merita Norton T, FNP   10 months ago Overweight with body mass index (BMI) of 25 to 25.9 in adult   [facility] Jacky Kindle, FNP   1 year ago Primary insomnia    [facility] Merita Norton T, FNP   1 year ago Overweight with body mass index (BMI) of 26 to 26.9 in adult  [facility] Jacky Kindle, [place]

## 2023-11-11 NOTE — Telephone Encounter (Signed)
Pt called back .  It's been a month since she asked about the Vyvanse.  She is wanting to know why she has not gotten a call back.  She does not want to take adderall.  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  Long term she wants the Vyvanse.  [facility].  CB@  [phone]

## 2023-11-12 ENCOUNTER — Other Ambulatory Visit: Payer: Self-pay | Admitting: Family Medicine

## 2023-11-12 NOTE — Telephone Encounter (Signed)
Patient called back to get an update on getting Vyvanse filled. Please follow up with patient. She had been waiting for a response since 11/19.

## 2023-11-13 NOTE — Telephone Encounter (Signed)
LVMTCB. CRM created. Ok for [facility] to advise appointment needed per provider

## 2023-11-28 ENCOUNTER — Telehealth: Payer: Self-pay

## 2023-11-28 NOTE — Telephone Encounter (Signed)
 Please schedule patient for an appointment to discuss Vyvanse prescription with new provider

## 2023-11-28 NOTE — Telephone Encounter (Signed)
 Patient is currently scheduled with Dr. Lang on 12/05/23 at 9:40am.  I called patient and advised she should keep her appointment with Dr. Lang in order to keep receiving her Vyvanse  since she hasn't yet met Dr. Lang and it has been 8 months since she was last seen for this. She verbalized understanding and is able to keep the appointment as scheduled.  Patient expressed great frustration in regards to her previous PCP: Emilio, FNP. She reports the process that it took for her to get properly prescribed her medication was most difficult. She reports making many calls to the office and receiving different information every time she calls. She reports her statements have been misconstrued or even changed completely from what she actually said or meant. She feels like there has been a lie added to her records somewhere along the way in regards to her use of Adderall historically. She is very frustrated with this and would like to discuss further with office manager.   She does reflect that she has been happy with our office previously and is hopeful that she regains that confidence with Dr. Lang. She asked for this information to be shared with Dr. Lang as well.   She reports that since she started seeing Emilio, several issues have gone unaddressed because everyone (herself included) has been focused on the Vyvanse /Adderall issue. She is hopeful to discuss a few additional concerns with Dr. Lang at her visit next week.   I encouraged her to remain optimistic and that we will work together to get things on the right track for her moving forward. She was appreciative of the phone call today.

## 2023-11-28 NOTE — Telephone Encounter (Signed)
 Copied from CRM [id number]. Topic: Appointments - Appointment Scheduling >> Nov 28, 2023 12:42 PM Yvone Marda Blow wrote: Pt Terri Holt would like a call back from Dr Lang, asking if she even has to come in for an appointment to get the medication Vyvanse .

## 2023-11-29 NOTE — Telephone Encounter (Signed)
 Reviewed. Will follow up as scheduled

## 2023-12-05 ENCOUNTER — Telehealth: Payer: Self-pay | Admitting: Family Medicine

## 2023-12-05 ENCOUNTER — Encounter: Payer: Self-pay | Admitting: Family Medicine

## 2023-12-05 ENCOUNTER — Ambulatory Visit (INDEPENDENT_AMBULATORY_CARE_PROVIDER_SITE_OTHER): Payer: Managed Care, Other (non HMO) | Admitting: Family Medicine

## 2023-12-05 VITALS — BP 118/82 | HR 80 | Ht 66.0 in | Wt 164.0 lb

## 2023-12-05 DIAGNOSIS — F902 Attention-deficit hyperactivity disorder, combined type: Secondary | ICD-10-CM | POA: Insufficient documentation

## 2023-12-05 DIAGNOSIS — R5383 Other fatigue: Secondary | ICD-10-CM

## 2023-12-05 DIAGNOSIS — R635 Abnormal weight gain: Secondary | ICD-10-CM | POA: Insufficient documentation

## 2023-12-05 DIAGNOSIS — F314 Bipolar disorder, current episode depressed, severe, without psychotic features: Secondary | ICD-10-CM | POA: Diagnosis not present

## 2023-12-05 DIAGNOSIS — F5104 Psychophysiologic insomnia: Secondary | ICD-10-CM

## 2023-12-05 DIAGNOSIS — K5904 Chronic idiopathic constipation: Secondary | ICD-10-CM | POA: Diagnosis not present

## 2023-12-05 MED ORDER — LISDEXAMFETAMINE DIMESYLATE 30 MG PO CAPS
30.0000 mg | ORAL_CAPSULE | Freq: Every day | ORAL | 0 refills | Status: DC
Start: 2023-12-05 — End: 2024-01-17

## 2023-12-05 MED ORDER — HYDROXYZINE PAMOATE 50 MG PO CAPS
50.0000 mg | ORAL_CAPSULE | Freq: Three times a day (TID) | ORAL | 0 refills | Status: DC | PRN
Start: 2023-12-05 — End: 2024-01-17

## 2023-12-05 NOTE — Assessment & Plan Note (Signed)
 Currently prescribed Adderall 10 mg twice daily but prefers Vyvanse , which was effective in the past. Reports better efficacy and preference for Vyvanse . Prefers not to take currently prescribed Adderall on weekends unless necessary for focus. Chronic, reports controlled symptoms on vyvanse  previously Pdmp review shows only adderall in last 2 years, intermittently - will restart  Vyvanse  30 mg daily given report of better focus on this  agent as adult  - Follow up in one month for ADHD management

## 2023-12-05 NOTE — Telephone Encounter (Signed)
 [employer] is requesting prior authorization Key: [id number] Lisdexamfetamine Dimesylate 30MG  Capsules

## 2023-12-05 NOTE — Assessment & Plan Note (Signed)
 Significant weight gain despite a healthy diet and regular exercise. Reports consuming 1500 calories daily, walking, and doing yoga. No abnormal swelling noted. Unclear etiology, no medications with expeccted weight gain SE - Order thyroid  function tests - Order A1c - Order complete metabolic panel

## 2023-12-05 NOTE — Progress Notes (Signed)
 Established patient visit  \]\ ] Patient: Terri Holt   DOB: 1991/05/15   32 y.o. Female  MRN: [id number] Visit Date: 12/05/2023  Today's healthcare provider: Rockie Agent, MD   Chief Complaint  Patient presents with   Follow-up    ADHD--requesting for Rx Vyvanse  Weight gained Manic depression   Subjective     HPI     Follow-up    Additional comments: ADHD--requesting for Rx Vyvanse  Weight gained Manic depression      Last edited by Agent Rockie, MD on 12/05/2023 10:47 PM.       Discussed the use of AI scribe software for clinical note transcription with the patient, who gave verbal consent to proceed.  History of Present Illness   The patient, a 33 year old individual with a history of ADHD and manic depression, presents for management of her ADHD and to address recent struggles with her mental health. She reports dissatisfaction with her current ADHD medication, Adderall 10mg  twice daily, which was last refilled on October 15, 2023. She expresses a preference for Vyvanse , which she had been prescribed in the past and found to be more effective.  The patient also reports a significant struggle with manic depression, which has been unmanaged since she lost her health insurance in 2020. She expresses a desire to resume mood stabilizers, which she found beneficial in the past. She reports being in a depressive low for several months, which is unusually long for her.  In addition to her mental health concerns, the patient reports unexplained weight gain despite maintaining a healthy diet and regular exercise. She also reports digestive issues, including constipation and abdominal pain, and frequent urinary tract infections. She mentions a history of blood in her stool and a tear in her lumbar muscle in her hip, which makes exercise difficult.  The patient is a physicist, medical and works, taking her ADHD medication primarily on weekdays  or when she needs to focus on homework. She reports not liking to take her medication on weekends unless necessary. She expresses frustration with her current medication management and a desire for a change in her ADHD medication.         Past Medical History:  Diagnosis Date   Anxiety    Bipolar I disorder, most recent episode depressed, severe without psychotic features (HCC)    Constipation    Depression    Headache    Mini stroke    Schizophrenia (HCC)     Medications: Outpatient Medications Prior to Visit  Medication Sig   Vitamin D , Ergocalciferol , (DRISDOL ) 1.25 MG (50000 UNIT) CAPS capsule Take 1 capsule (50,000 Units total) by mouth every 7 (seven) days.   [DISCONTINUED] amphetamine -dextroamphetamine  (ADDERALL) 10 MG tablet Take 1 tablet (10 mg total) by mouth 2 (two) times daily with a meal. ** An in office or virtual appt is needed for further refills **   No facility-administered medications prior to visit.    Review of Systems  Constitutional:  Positive for fatigue.  Psychiatric/Behavioral:  Positive for decreased concentration and sleep disturbance.     Last CBC Lab Results  Component Value Date   WBC 9.2 10/04/2022   HGB 14.4 10/04/2022   HCT 42.4 10/04/2022   MCV 90 10/04/2022   MCH 30.5 10/04/2022   RDW 11.9 10/04/2022   PLT 232 10/04/2022   Last metabolic panel Lab Results  Component Value Date   GLUCOSE 85 10/04/2022   NA 138 10/04/2022   K 3.9 10/04/2022  CL 101 10/04/2022   CO2 21 10/04/2022   BUN 10 10/04/2022   CREATININE 0.73 10/04/2022   EGFR 111 10/04/2022   CALCIUM 9.2 10/04/2022   PROT 6.7 10/04/2022   ALBUMIN 4.6 10/04/2022   LABGLOB 2.1 10/04/2022   AGRATIO 2.2 10/04/2022   BILITOT 0.4 10/04/2022   ALKPHOS 65 10/04/2022   AST 18 10/04/2022   ALT 26 10/04/2022   ANIONGAP 8 01/30/2019   Last lipids Lab Results  Component Value Date   CHOL 120 01/31/2019   HDL 50 01/31/2019   LDLCALC 41 01/31/2019   TRIG 147 01/31/2019    CHOLHDL 2.4 01/31/2019   Last hemoglobin A1c Lab Results  Component Value Date   HGBA1C 4.7 (L) 01/31/2019   Last thyroid  functions Lab Results  Component Value Date   TSH 1.540 10/04/2022        Objective    BP 118/82 (BP Location: Left Arm, Patient Position: Sitting, Cuff Size: Normal)   Pulse 80   Ht 5' 6 (1.676 m)   Wt 164 lb (74.4 kg)   LMP 11/29/2023   SpO2 97%   BMI 26.47 kg/m  BP Readings from Last 3 Encounters:  12/05/23 118/82  06/06/23 98/70  05/13/23 (!) 95/55   Wt Readings from Last 3 Encounters:  12/05/23 164 lb (74.4 kg)  06/06/23 154 lb (69.9 kg)  05/13/23 159 lb 9.6 oz (72.4 kg)        Physical Exam Vitals reviewed.  Constitutional:      General: She is not in acute distress.    Appearance: She is not ill-appearing, toxic-appearing or diaphoretic.     Comments: Tired appearing  Eyes:     Conjunctiva/sclera: Conjunctivae normal.  Cardiovascular:     Rate and Rhythm: Normal rate and regular rhythm.     Pulses: Normal pulses.     Heart sounds: Normal heart sounds. No murmur heard.    No friction rub. No gallop.  Pulmonary:     Effort: Pulmonary effort is normal. No respiratory distress.     Breath sounds: Normal breath sounds. No stridor. No wheezing, rhonchi or rales.  Abdominal:     General: Bowel sounds are normal. There is no distension.     Palpations: Abdomen is soft.     Tenderness: There is no abdominal tenderness.  Musculoskeletal:     Right lower leg: No edema.     Left lower leg: No edema.  Skin:    Findings: No erythema or rash.  Neurological:     Mental Status: She is alert and oriented to person, place, and time.  Psychiatric:        Attention and Perception: Attention normal.        Mood and Affect: Mood is depressed.        Speech: Speech normal.        Behavior: Behavior normal. Behavior is cooperative.        Thought Content: Thought content normal.        Judgment: Judgment normal.     Comments: Guarded  affect     No results found for any visits on 12/05/23.  Assessment & Plan     Problem List Items Addressed This Visit       Digestive   Chronic idiopathic constipation   Chronic constipation with a history of slow-moving colon. Previous treatments with Miralax  and Linzess were ineffective. Reports using the bathroom twice a week and experiencing constant stomachache. Reports no improvement with prior GI eval -  Consider further gastroenterology follow-up if symptoms persist      Relevant Orders   CBC w/Diff/Platelet   Ferritin     Other   Weight gain   Significant weight gain despite a healthy diet and regular exercise. Reports consuming 1500 calories daily, walking, and doing yoga. No abnormal swelling noted. Unclear etiology, no medications with expeccted weight gain SE - Order thyroid  function tests - Order A1c - Order complete metabolic panel      Relevant Orders   Hemoglobin A1c   CMP14+EGFR   TSH+T4F+T3Free   Ferritin   Bipolar I disorder, most recent episode depressed, severe without psychotic features (HCC)   Request to restart mood stabilizers. Previously on Vraylar, which was effective. Reports prolonged depressive episode and significant weight gain despite a healthy lifestyle. No current psychiatric care due to loss of insurance. Prefers individual therapy over group therapy due to discomfort in group settings. Chronic, uncontrolled - Refer to psychiatry with an urgent referral - Provide contact information for Dr.Kapur for potential quicker appointment - resume hydroxyzine  50 mg three times daily for anxiety      Relevant Medications   hydrOXYzine  (VISTARIL ) 50 MG capsule   Other Relevant Orders   Ambulatory referral to Psychiatry   CBC w/Diff/Platelet   Attention deficit hyperactivity disorder (ADHD), combined type - Primary   Currently prescribed Adderall 10 mg twice daily but prefers Vyvanse , which was effective in the past. Reports better efficacy and  preference for Vyvanse . Prefers not to take currently prescribed Adderall on weekends unless necessary for focus. Chronic, reports controlled symptoms on vyvanse  previously Pdmp review shows only adderall in last 2 years, intermittently - will restart  Vyvanse  30 mg daily given report of better focus on this  agent as adult  - Follow up in one month for ADHD management      Relevant Medications   lisdexamfetamine (VYVANSE ) 30 MG capsule   Other Relevant Orders   Ambulatory referral to Psychiatry   Other Visit Diagnoses       Other fatigue       Relevant Orders   CBC w/Diff/Platelet   Ferritin     Psychophysiological insomnia       Relevant Medications   hydrOXYzine  (VISTARIL ) 50 MG capsule           General Health Maintenance General health maintenance discussed. - Schedule physical examination    Return in about 1 month (around 01/05/2024) for Bipolar , ADHD.         Rockie Agent, MD  [facility] [phone] (phone) [phone] (fax)  [facility]

## 2023-12-05 NOTE — Assessment & Plan Note (Signed)
 Chronic constipation with a history of slow-moving colon. Previous treatments with Miralax  and Linzess were ineffective. Reports using the bathroom twice a week and experiencing constant stomachache. Reports no improvement with prior GI eval - Consider further gastroenterology follow-up if symptoms persist

## 2023-12-05 NOTE — Assessment & Plan Note (Signed)
 Request to restart mood stabilizers. Previously on Vraylar, which was effective. Reports prolonged depressive episode and significant weight gain despite a healthy lifestyle. No current psychiatric care due to loss of insurance. Prefers individual therapy over group therapy due to discomfort in group settings. Chronic, uncontrolled - Refer to psychiatry with an urgent referral - Provide contact information for Dr.Kapur for potential quicker appointment - resume hydroxyzine  50 mg three times daily for anxiety

## 2023-12-05 NOTE — Telephone Encounter (Signed)
 PA submitted in Cover My Meds for Lisdexamphetamine Dimesylate.  Ethlyn Gallery (Key: Alexander) PA Case ID #: [id number] Rx #: [id number]

## 2023-12-05 NOTE — Patient Instructions (Addendum)
 Viviane Drone, MD, PA [address], [place], [place] [address] [phone]   VISIT SUMMARY:  Today, we discussed your ongoing management for ADHD and manic depression, as well as your recent struggles with weight gain, digestive issues, and frequent urinary tract infections. We have made some changes to your medication and planned further evaluations to address your concerns.  YOUR PLAN:  -ATTENTION-DEFICIT/HYPERACTIVITY DISORDER (ADHD): ADHD is a condition characterized by symptoms of inattention, hyperactivity, and impulsivity. We have switched your medication from Adderall to Vyvanse  30 mg daily, as you reported better efficacy with Vyvanse  in the past. Please take this medication daily and follow up in one month to assess its effectiveness.  -BIPOLAR DISORDER: Bipolar disorder is a mental health condition marked by extreme mood swings, including emotional highs (mania) and lows (depression). We have referred you to psychiatry urgently and provided contact information for Ari Kapoor to potentially expedite your appointment. In the meantime, we have prescribed hydroxyzine  50 mg three times daily to help manage your anxiety.  -WEIGHT GAIN: Despite maintaining a healthy diet and regular exercise, you have experienced significant weight gain. We have ordered thyroid  function tests, an A1c test, and a complete metabolic panel to investigate potential underlying causes.   -GENERAL HEALTH MAINTENANCE: We discussed general health maintenance, including the importance of routine check-ups and lab tests. Please schedule a physical examination soon.  INSTRUCTIONS:  Please follow up in 4-6 weeks for your next appointment. Expect a call from psychiatry for an urgent referral by next week.

## 2023-12-06 LAB — CBC WITH DIFFERENTIAL/PLATELET
Basophils Absolute: 0 x10E3/uL (ref 0.0–0.2)
Basos: 1 %
EOS (ABSOLUTE): 0.2 x10E3/uL (ref 0.0–0.4)
Eos: 3 %
Hematocrit: 46.8 % — ABNORMAL HIGH (ref 34.0–46.6)
Hemoglobin: 15.4 g/dL (ref 11.1–15.9)
Immature Grans (Abs): 0 x10E3/uL (ref 0.0–0.1)
Immature Granulocytes: 0 %
Lymphocytes Absolute: 2.1 x10E3/uL (ref 0.7–3.1)
Lymphs: 28 %
MCH: 30.2 pg (ref 26.6–33.0)
MCHC: 32.9 g/dL (ref 31.5–35.7)
MCV: 92 fL (ref 79–97)
Monocytes Absolute: 0.5 x10E3/uL (ref 0.1–0.9)
Monocytes: 6 %
Neutrophils Absolute: 4.6 x10E3/uL (ref 1.4–7.0)
Neutrophils: 62 %
Platelets: 245 x10E3/uL (ref 150–450)
RBC: 5.1 x10E6/uL (ref 3.77–5.28)
RDW: 11.9 % (ref 11.7–15.4)
WBC: 7.5 x10E3/uL (ref 3.4–10.8)

## 2023-12-06 LAB — FERRITIN: Ferritin: 167 ng/mL — ABNORMAL HIGH (ref 15–150)

## 2023-12-06 NOTE — Telephone Encounter (Signed)
General.Closed by health plan.We thank you for taking the time to submit your request. However, this member has alternative pharmacy benefits. [employer] is the payer of last resort. Please have the pharmacy bill the member's other insurance plan first. If the member feels that this is in error, please have the member call Member Services at [phone]. If the requested medication is not covered or was denied by the [employer], an explanation of benefits or proof of denial must be submitted with the request prior to coverage by [employer] [facility].

## 2023-12-09 ENCOUNTER — Encounter: Payer: Self-pay | Admitting: Family Medicine

## 2024-01-13 ENCOUNTER — Ambulatory Visit: Payer: Managed Care, Other (non HMO) | Admitting: Family Medicine

## 2024-01-13 ENCOUNTER — Telehealth: Payer: Self-pay

## 2024-01-13 NOTE — Telephone Encounter (Signed)
Reviewed. Will follow up with pt on 01/17/24

## 2024-01-13 NOTE — Telephone Encounter (Signed)
Copied from CRM [id number]. Topic: Appointments - Appointment Cancel/Reschedule >> Jan 13, 2024  8:49 AM Albin Felling L wrote: Reason for CRM: Pt missed appointment as child woke up with fever of 104. Pt took child to be seen and requested message be sent to office as to why appointment was missed. Pt scheduled appointment for this Friday 01/17/24.

## 2024-01-13 NOTE — Progress Notes (Deleted)
      Established patient visit   Patient: Terri Holt   DOB: 05/12/1991   33 y.o. Female  MRN: [id number] Visit Date: 01/13/2024  Today's healthcare provider: Ronnald Ramp, MD   No chief complaint on file.  Subjective       Discussed the use of AI scribe software for clinical note transcription with the patient, who gave verbal consent to proceed.  History of Present Illness             Past Medical History:  Diagnosis Date   Anxiety    Bipolar I disorder, most recent episode depressed, severe without psychotic features (HCC)    Constipation    Depression    Headache    Mini stroke    Schizophrenia (HCC)     Medications: Outpatient Medications Prior to Visit  Medication Sig   hydrOXYzine (VISTARIL) 50 MG capsule Take 1 capsule (50 mg total) by mouth 3 (three) times daily as needed.   lisdexamfetamine (VYVANSE) 30 MG capsule Take 1 capsule (30 mg total) by mouth daily.   Vitamin D, Ergocalciferol, (DRISDOL) 1.25 MG (50000 UNIT) CAPS capsule Take 1 capsule (50,000 Units total) by mouth every 7 (seven) days.   No facility-administered medications prior to visit.    Review of Systems  Last CBC Lab Results  Component Value Date   WBC 7.5 12/05/2023   HGB 15.4 12/05/2023   HCT 46.8 (H) 12/05/2023   MCV 92 12/05/2023   MCH 30.2 12/05/2023   RDW 11.9 12/05/2023   PLT 245 12/05/2023   Last metabolic panel Lab Results  Component Value Date   GLUCOSE 85 10/04/2022   NA 138 10/04/2022   K 3.9 10/04/2022   CL 101 10/04/2022   CO2 21 10/04/2022   BUN 10 10/04/2022   CREATININE 0.73 10/04/2022   EGFR 111 10/04/2022   CALCIUM 9.2 10/04/2022   PROT 6.7 10/04/2022   ALBUMIN 4.6 10/04/2022   LABGLOB 2.1 10/04/2022   AGRATIO 2.2 10/04/2022   BILITOT 0.4 10/04/2022   ALKPHOS 65 10/04/2022   AST 18 10/04/2022   ALT 26 10/04/2022   ANIONGAP 8 01/30/2019   Last hemoglobin A1c Lab Results  Component Value Date   HGBA1C 4.7 (L)  01/31/2019   Last thyroid functions Lab Results  Component Value Date   TSH 1.540 10/04/2022     {See past labs  Heme  Chem  Endocrine  Serology  Results Review (optional):1}   Objective    There were no vitals taken for this visit. BP Readings from Last 3 Encounters:  12/05/23 118/82  06/06/23 98/70  05/13/23 (!) 95/55   Wt Readings from Last 3 Encounters:  12/05/23 164 lb (74.4 kg)  06/06/23 154 lb (69.9 kg)  05/13/23 159 lb 9.6 oz (72.4 kg)    {See vitals history (optional):1}    Physical Exam  ***  No results found for any visits on 01/13/24.  Assessment & Plan     Problem List Items Addressed This Visit   None             ***needs old labs ordered for weight gain    No follow-ups on file.         Ronnald Ramp, MD  [facility] [phone] (phone) [phone] (fax)  [facility]

## 2024-01-17 ENCOUNTER — Encounter: Payer: Self-pay | Admitting: Family Medicine

## 2024-01-17 ENCOUNTER — Ambulatory Visit (INDEPENDENT_AMBULATORY_CARE_PROVIDER_SITE_OTHER): Payer: Managed Care, Other (non HMO) | Admitting: Family Medicine

## 2024-01-17 VITALS — BP 132/76 | HR 91 | Ht 66.0 in | Wt 167.0 lb

## 2024-01-17 DIAGNOSIS — F5104 Psychophysiologic insomnia: Secondary | ICD-10-CM | POA: Diagnosis not present

## 2024-01-17 DIAGNOSIS — F314 Bipolar disorder, current episode depressed, severe, without psychotic features: Secondary | ICD-10-CM | POA: Diagnosis not present

## 2024-01-17 DIAGNOSIS — F902 Attention-deficit hyperactivity disorder, combined type: Secondary | ICD-10-CM

## 2024-01-17 DIAGNOSIS — R41840 Attention and concentration deficit: Secondary | ICD-10-CM

## 2024-01-17 MED ORDER — LISDEXAMFETAMINE DIMESYLATE 40 MG PO CAPS
40.0000 mg | ORAL_CAPSULE | ORAL | 0 refills | Status: DC
Start: 2024-01-17 — End: 2024-02-26

## 2024-01-17 MED ORDER — HYDROXYZINE PAMOATE 50 MG PO CAPS
50.0000 mg | ORAL_CAPSULE | Freq: Three times a day (TID) | ORAL | 1 refills | Status: AC | PRN
Start: 2024-01-17 — End: ?

## 2024-01-17 NOTE — Assessment & Plan Note (Signed)
Chronic, uncontrolled Bipolar I Disorder. Continues on hydroxyzine 50 mg TID PRN for anxiety. Prefers not to take hydroxyzine frequently despite benefits. Noted receiving 30 tablets instead of 90. Urgent psychiatry referral submitted last visit. Chronic - Continue hydroxyzine 50 mg TID PRN - Correct hydroxyzine prescription to 90 tablets - Ensure urgent psychiatry referral follow-up

## 2024-01-17 NOTE — Progress Notes (Signed)
Established patient visit   Patient: Terri Holt   DOB: 27-Feb-1991   33 y.o. Female  MRN: [id number] Visit Date: 01/17/2024  Today's healthcare provider: Ronnald Ramp, MD   Chief Complaint  Patient presents with   Manic Behavior   ADHD   Subjective       Discussed the use of AI scribe software for clinical note transcription with the patient, who gave verbal consent to proceed.  History of Present Illness   Terri Spelman "Florentina Addison" is a 33 year old female with ADHD and bipolar one disorder who presents for chronic follow up.  She has been managing her ADHD with Vyvanse 30 mg daily, which has improved her attention. However, she notes that the medication's effect diminishes around lunchtime, impacting her work schedule as she takes it early in the morning.  For her bipolar one disorder, she takes hydroxyzine 50 mg up to three times daily as needed for anxiety. This has been effective, but due to recent stress, she has been using it more frequently, exceeding her usual one pill per day. She recalls a previous prescription for 90 pills, but currently receives 30, which she attributes to a possible oversight.  She is experiencing significant stress and fatigue, exacerbated by her son's recent ICU admission for asthma and flu strain A. Her sleep is severely disrupted, with only 30 to 45 minutes of rest at a time since Monday, contributing to her exhaustion.  She is concerned about weight gain despite maintaining a calorie deficit diet and regular exercise. Her last appointment included an A1c test, and a CBC was normal. Her ferritin level was slightly elevated at 167, but she was not anemic with a hemoglobin of 15.4. She notes that her TSH, T4, T3 levels, and a complete metabolic panel were not collected during her last visit.      Past Medical History:  Diagnosis Date   Anxiety    Bipolar I disorder, most recent episode depressed, severe without  psychotic features (HCC)    Constipation    Depression    Headache    Mini stroke    Schizophrenia (HCC)     Medications: Outpatient Medications Prior to Visit  Medication Sig   Vitamin D, Ergocalciferol, (DRISDOL) 1.25 MG (50000 UNIT) CAPS capsule Take 1 capsule (50,000 Units total) by mouth every 7 (seven) days.   [DISCONTINUED] hydrOXYzine (VISTARIL) 50 MG capsule Take 1 capsule (50 mg total) by mouth 3 (three) times daily as needed.   [DISCONTINUED] lisdexamfetamine (VYVANSE) 30 MG capsule Take 1 capsule (30 mg total) by mouth daily.   No facility-administered medications prior to visit.    Review of Systems  Last CBC Lab Results  Component Value Date   WBC 7.5 12/05/2023   HGB 15.4 12/05/2023   HCT 46.8 (H) 12/05/2023   MCV 92 12/05/2023   MCH 30.2 12/05/2023   RDW 11.9 12/05/2023   PLT 245 12/05/2023   Last metabolic panel Lab Results  Component Value Date   GLUCOSE 85 10/04/2022   NA 138 10/04/2022   K 3.9 10/04/2022   CL 101 10/04/2022   CO2 21 10/04/2022   BUN 10 10/04/2022   CREATININE 0.73 10/04/2022   EGFR 111 10/04/2022   CALCIUM 9.2 10/04/2022   PROT 6.7 10/04/2022   ALBUMIN 4.6 10/04/2022   LABGLOB 2.1 10/04/2022   AGRATIO 2.2 10/04/2022   BILITOT 0.4 10/04/2022   ALKPHOS 65 10/04/2022   AST 18 10/04/2022   ALT 26  10/04/2022   ANIONGAP 8 01/30/2019   Last lipids Lab Results  Component Value Date   CHOL 120 01/31/2019   HDL 50 01/31/2019   LDLCALC 41 01/31/2019   TRIG 147 01/31/2019   CHOLHDL 2.4 01/31/2019   Last hemoglobin A1c Lab Results  Component Value Date   HGBA1C 4.7 (L) 01/31/2019   Last thyroid functions Lab Results  Component Value Date   TSH 1.540 10/04/2022        Objective    BP 132/76   Pulse 91   Ht 5\' 6"  (1.676 m)   Wt 167 lb (75.8 kg)   LMP 01/01/2024   BMI 26.95 kg/m   BP Readings from Last 3 Encounters:  01/17/24 132/76  12/05/23 118/82  06/06/23 98/70   Wt Readings from Last 3 Encounters:   01/17/24 167 lb (75.8 kg)  12/05/23 164 lb (74.4 kg)  06/06/23 154 lb (69.9 kg)        Physical Exam Psychiatric:        Attention and Perception: Attention normal. She is attentive.        Mood and Affect: Affect is blunt.        Speech: Speech normal.        Behavior: Behavior is withdrawn.     General: Alert, no acute distress Cardio: Normal S1 and S2, RRR, no r/m/g Pulm: CTAB, normal work of breathing   No results found for any visits on 01/17/24.  Assessment & Plan     Problem List Items Addressed This Visit       Other   Concentration deficit - Primary   Chronic ADHD. Reports improved attention with Vyvanse 30 mg daily but notes that the effect wears off by lunchtime. Discussed increasing Vyvanse to 40 mg daily, which is within insurance coverage. Chronic  - Increase Vyvanse to 40 mg daily - Follow up in one month to assess response      Relevant Medications   lisdexamfetamine (VYVANSE) 40 MG capsule   Bipolar I disorder, most recent episode depressed, severe without psychotic features (HCC)   Chronic, uncontrolled Bipolar I Disorder. Continues on hydroxyzine 50 mg TID PRN for anxiety. Prefers not to take hydroxyzine frequently despite benefits. Noted receiving 30 tablets instead of 90. Urgent psychiatry referral submitted last visit. Chronic - Continue hydroxyzine 50 mg TID PRN - Correct hydroxyzine prescription to 90 tablets - Ensure urgent psychiatry referral follow-up          Relevant Medications   hydrOXYzine (VISTARIL) 50 MG capsule   Attention deficit hyperactivity disorder (ADHD), combined type   Relevant Medications   lisdexamfetamine (VYVANSE) 40 MG capsule   Other Visit Diagnoses       Psychophysiological insomnia       Relevant Medications   hydrOXYzine (VISTARIL) 50 MG capsule           Return in about 1 month (around 02/14/2024) for ADHD.      Ronnald Ramp, MD  [facility] [phone]  (phone) [phone] (fax)  [facility]

## 2024-01-17 NOTE — Assessment & Plan Note (Signed)
Chronic ADHD. Reports improved attention with Vyvanse 30 mg daily but notes that the effect wears off by lunchtime. Discussed increasing Vyvanse to 40 mg daily, which is within insurance coverage. Chronic  - Increase Vyvanse to 40 mg daily - Follow up in one month to assess response

## 2024-01-27 ENCOUNTER — Other Ambulatory Visit: Payer: Self-pay | Admitting: Family Medicine

## 2024-01-27 DIAGNOSIS — F5104 Psychophysiologic insomnia: Secondary | ICD-10-CM

## 2024-01-27 DIAGNOSIS — F314 Bipolar disorder, current episode depressed, severe, without psychotic features: Secondary | ICD-10-CM

## 2024-01-27 NOTE — Telephone Encounter (Signed)
 Copied from CRM [id number]. Topic: Clinical - Medication Refill >> Jan 27, 2024  3:24 PM Nyra Capes wrote: Most Recent Primary Care Visit:  Provider: Ronnald Ramp  Department: [id number]  Visit Type: OFFICE VISIT  Date: 12/05/2023  Medication: lisdexamfetamine (VYVANSE) 40 MG   Has the patient contacted their pharmacy? Yes (Agent: If no, request that the patient contact the pharmacy for the refill. If patient does not wish to contact the pharmacy document the reason why and proceed with request.) (Agent: If yes, when and what did the pharmacy advise?)  Is this the correct pharmacy for this prescription? Yes If no, delete pharmacy and type the correct one.  This is the patient's preferred pharmacy:  [id number] [address], [facility] - [address] [place] [address] Phone: [phone] Fax: [phone]  [facility] [address] [place] - [address] [place] [address] Phone: [phone] Fax: [phone]   Has the prescription been filled recently? No  Is the patient out of the medication? Yes  Has the patient been seen for an appointment in the last year OR does the patient have an upcoming appointment? Yes  Can we respond through MyChart? No  Agent: Please be advised that Rx refills may take up to 3 business days. We ask that you follow-up with your pharmacy.

## 2024-01-30 ENCOUNTER — Encounter: Payer: Self-pay | Admitting: Family Medicine

## 2024-02-01 ENCOUNTER — Telehealth: Admitting: Family Medicine

## 2024-02-01 ENCOUNTER — Encounter (HOSPITAL_COMMUNITY): Payer: Self-pay

## 2024-02-01 ENCOUNTER — Encounter (HOSPITAL_COMMUNITY): Payer: Self-pay | Admitting: Psychiatry

## 2024-02-01 ENCOUNTER — Telehealth (HOSPITAL_COMMUNITY): Admitting: Psychiatry

## 2024-02-01 ENCOUNTER — Ambulatory Visit (HOSPITAL_BASED_OUTPATIENT_CLINIC_OR_DEPARTMENT_OTHER): Admitting: Psychiatry

## 2024-02-01 DIAGNOSIS — Z79899 Other long term (current) drug therapy: Secondary | ICD-10-CM

## 2024-02-01 DIAGNOSIS — F1911 Other psychoactive substance abuse, in remission: Secondary | ICD-10-CM

## 2024-02-01 DIAGNOSIS — F633 Trichotillomania: Secondary | ICD-10-CM

## 2024-02-01 DIAGNOSIS — F3161 Bipolar disorder, current episode mixed, mild: Secondary | ICD-10-CM | POA: Diagnosis not present

## 2024-02-01 DIAGNOSIS — F4310 Post-traumatic stress disorder, unspecified: Secondary | ICD-10-CM | POA: Insufficient documentation

## 2024-02-01 DIAGNOSIS — F419 Anxiety disorder, unspecified: Secondary | ICD-10-CM

## 2024-02-01 DIAGNOSIS — R41840 Attention and concentration deficit: Secondary | ICD-10-CM | POA: Diagnosis not present

## 2024-02-01 DIAGNOSIS — F1910 Other psychoactive substance abuse, uncomplicated: Secondary | ICD-10-CM

## 2024-02-01 DIAGNOSIS — Z9189 Other specified personal risk factors, not elsewhere classified: Secondary | ICD-10-CM

## 2024-02-01 MED ORDER — TRAZODONE HCL 50 MG PO TABS
25.0000 mg | ORAL_TABLET | Freq: Every evening | ORAL | 1 refills | Status: AC | PRN
Start: 2024-02-01 — End: ?

## 2024-02-01 MED ORDER — LAMOTRIGINE 25 MG PO TABS
ORAL_TABLET | ORAL | 0 refills | Status: DC
Start: 2024-02-01 — End: 2024-03-02
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
## 2024-02-01 NOTE — Patient Instructions (Addendum)
 Please call for EKG - [phone]   Insomnia Insomnia is a sleep disorder that makes it difficult to fall asleep or stay asleep. Insomnia can cause fatigue, low energy, difficulty concentrating, mood swings, and poor performance at work or school. There are three different ways to classify insomnia: Difficulty falling asleep. Difficulty staying asleep. Waking up too early in the morning. Any type of insomnia can be long-term (chronic) or short-term (acute). Both are common. Short-term insomnia usually lasts for 3 months or less. Chronic insomnia occurs at least three times a week for longer than 3 months. What are the causes? Insomnia may be caused by another condition, situation, or substance, such as: Having certain mental health conditions, such as anxiety and depression. Using caffeine, alcohol, tobacco, or drugs. Having gastrointestinal conditions, such as gastroesophageal reflux disease (GERD). Having certain medical conditions. These include: Asthma. Alzheimer's disease. Stroke. Chronic pain. An overactive thyroid gland (hyperthyroidism). Other sleep disorders, such as restless legs syndrome and sleep apnea. Menopause. Sometimes, the cause of insomnia may not be known. What increases the risk? Risk factors for insomnia include: Gender. Females are affected more often than males. Age. Insomnia is more common as people get older. Stress and certain medical and mental health conditions. Lack of exercise. Having an irregular work schedule. This may include working night shifts and traveling between different time zones. What are the signs or symptoms? If you have insomnia, the main symptom is having trouble falling asleep or having trouble staying asleep. This may lead to other symptoms, such as: Feeling tired or having low energy. Feeling nervous about going to sleep. Not feeling rested in the morning. Having trouble concentrating. Feeling irritable, anxious, or  depressed. How is this diagnosed? This condition may be diagnosed based on: Your symptoms and medical history. Your health care provider may ask about: Your sleep habits. Any medical conditions you have. Your mental health. A physical exam. How is this treated? Treatment for insomnia depends on the cause. Treatment may focus on treating an underlying condition that is causing the insomnia. Treatment may also include: Medicines to help you sleep. Counseling or therapy. Lifestyle adjustments to help you sleep better. Follow these instructions at home: Eating and drinking  Limit or avoid alcohol, caffeinated beverages, and products that contain nicotine and tobacco, especially close to bedtime. These can disrupt your sleep. Do not eat a large meal or eat spicy foods right before bedtime. This can lead to digestive discomfort that can make it hard for you to sleep. Sleep habits  Keep a sleep diary to help you and your health care provider figure out what could be causing your insomnia. Write down: When you sleep. When you wake up during the night. How well you sleep and how rested you feel the next day. Any side effects of medicines you are taking. What you eat and drink. Make your bedroom a dark, comfortable place where it is easy to fall asleep. Put up shades or blackout curtains to block light from outside. Use a white noise machine to block noise. Keep the temperature cool. Limit screen use before bedtime. This includes: Not watching TV. Not using your smartphone, tablet, or computer. Stick to a routine that includes going to bed and waking up at the same times every day and night. This can help you fall asleep faster. Consider making a quiet activity, such as reading, part of your nighttime routine. Try to avoid taking naps during the day so that you sleep better at night. Get  out of bed if you are still awake after 15 minutes of trying to sleep. Keep the lights down, but try  reading or doing a quiet activity. When you feel sleepy, go back to bed. General instructions Take over-the-counter and prescription medicines only as told by your health care provider. Exercise regularly as told by your health care provider. However, avoid exercising in the hours right before bedtime. Use relaxation techniques to manage stress. Ask your health care provider to suggest some techniques that may work well for you. These may include: Breathing exercises. Routines to release muscle tension. Visualizing peaceful scenes. Make sure that you drive carefully. Do not drive if you feel very sleepy. Keep all follow-up visits. This is important. Contact a health care provider if: You are tired throughout the day. You have trouble in your daily routine due to sleepiness. You continue to have sleep problems, or your sleep problems get worse. Get help right away if: You have thoughts about hurting yourself or someone else. Get help right away if you feel like you may hurt yourself or others, or have thoughts about taking your own life. Go to your nearest emergency room or: Call 911. Call the National Suicide Prevention Lifeline at 4637134775 or 988. This is open 24 hours a day. Text the Crisis Text Line at 816-291-2380. Summary Insomnia is a sleep disorder that makes it difficult to fall asleep or stay asleep. Insomnia can be long-term (chronic) or short-term (acute). Treatment for insomnia depends on the cause. Treatment may focus on treating an underlying condition that is causing the insomnia. Keep a sleep diary to help you and your health care provider figure out what could be causing your insomnia. This information is not intended to replace advice given to you by your health care provider. Make sure you discuss any questions you have with your health care provider. Document Revised: 10/23/2021 Document Reviewed: 10/23/2021 Elsevier Patient Education  2024 [employer] Inc.Lamotrigine  Tablets What is this medication? LAMOTRIGINE (la MOE Patrecia Pace) prevents and controls seizures in people with epilepsy. It may also be used to treat bipolar disorder. It works by calming overactive nerves in your body. This medicine may be used for other purposes; ask your health care provider or pharmacist if you have questions. COMMON BRAND NAME(S): Lamictal, Subvenite What should I tell my care team before I take this medication? They need to know if you have any of these conditions: Heart disease History of irregular heartbeat Immune system problems Kidney disease Liver disease Low levels of folic acid in the blood Lupus Mental health condition Suicidal thoughts, plans, or attempt by you or a family member An unusual or allergic reaction to lamotrigine, other medications, foods, dyes, or preservatives Pregnant or trying to get pregnant Breastfeeding How should I use this medication? Take this medication by mouth with a glass of water. Follow the directions on the prescription label. Do not chew these tablets. If this medication upsets your stomach, take it with food or milk. Take your doses at regular intervals. Do not take your medication more often than directed. A special MedGuide will be given to you by the pharmacist with each new prescription and refill. Be sure to read this information carefully each time. Talk to your care team about the use of this medication in children. While this medication may be prescribed for children as young as 2 years for selected conditions, precautions do apply. Overdosage: If you think you have taken too much of this medicine contact a  poison control center or emergency room at once. NOTE: This medicine is only for you. Do not share this medicine with others. What if I miss a dose? If you miss a dose, take it as soon as you can. If it is almost time for your next dose, take only that dose. Do not take double or extra doses. What may interact with  this medication? Atazanavir Certain medications for irregular heartbeat Certain medications for seizures, such as carbamazepine, phenobarbital, phenytoin, primidone, or valproic acid Estrogen or progestin hormones Lopinavir Rifampin Ritonavir This list may not describe all possible interactions. Give your health care provider a list of all the medicines, herbs, non-prescription drugs, or dietary supplements you use. Also tell them if you smoke, drink alcohol, or use illegal drugs. Some items may interact with your medicine. What should I watch for while using this medication? Visit your care team for regular checks on your progress. If you take this medication for seizures, wear a Medic Alert bracelet or necklace. Carry an identification card with information about your condition, medications, and care team. It is important to take this medication exactly as directed. When first starting treatment, your dose will need to be adjusted slowly. It may take weeks or months before your dose is stable. You should contact your care team if your seizures get worse or if you have any new types of seizures. Do not stop taking this medication unless instructed by your care team. Stopping your medication suddenly can increase your seizures or their severity. This medication may cause serious skin reactions. They can happen weeks to months after starting the medication. Contact your care team right away if you notice fevers or flu-like symptoms with a rash. The rash may be red or purple and then turn into blisters or peeling of the skin. You may also notice a red rash with swelling of the face, lips, or lymph nodes in your neck or under your arms. This medication may affect your coordination, reaction time, or judgment. Do not drive or operate machinery until you know how this medication affects you. Sit up or stand slowly to reduce the risk of dizzy or fainting spells. Drinking alcohol with this medication can  increase the risk of these side effects. If you are taking this medication for bipolar disorder, it is important to report any changes in your mood to your care team. If your condition gets worse, you get mentally depressed, feel very hyperactive or manic, have difficulty sleeping, or have thoughts of hurting yourself or committing suicide, you need to get help from your care team right away. If you are a caregiver for someone taking this medication for bipolar disorder, you should also report these behavioral changes right away. The use of this medication may increase the chance of suicidal thoughts or actions. Pay special attention to how you are responding while on this medication. Your mouth may get dry. Chewing sugarless gum or sucking hard candy and drinking plenty of water may help. Contact your care team if the problem does not go away or is severe. If you become pregnant while using this medication, you may enroll in the [place] [employer] Antiepileptic Drug Pregnancy Registry by calling [phone]. This registry collects information about the safety of antiepileptic medication use during pregnancy. This medication may cause a decrease in folic acid. You should make sure that you get enough folic acid while you are taking this medication. Discuss the foods you eat and the vitamins you take with your care  team. What side effects may I notice from receiving this medication? Side effects that you should report to your care team as soon as possible: Allergic reactions--skin rash, itching, hives, swelling of the face, lips, tongue, or throat Change in vision Fever, neck pain or stiffness, sensitivity to light, headache, nausea, vomiting, confusion, which may be signs of meningitis Fever, rash, swollen lymph nodes, confusion, trouble walking, loss of balance or coordination, seizures Heart rhythm changes--fast or irregular heartbeat, dizziness, feeling faint or lightheaded, chest pain, trouble  breathing Infection--fever, chills, cough, or sore throat Low red blood cell level--unusual weakness or fatigue, dizziness, headache, trouble breathing Rash, fever, and swollen lymph nodes Redness, blistering, peeling, or loosening of the skin, including inside the mouth Thoughts of suicide or self-harm, worsening mood, feelings of depression Unusual bruising or bleeding Side effects that usually do not require medical attention (report these to your care team if they continue or are bothersome): Diarrhea Dizziness Drowsiness Headache Nausea Stomach pain Tremors or shaking This list may not describe all possible side effects. Call your doctor for medical advice about side effects. You may report side effects to FDA at 1-800-FDA-1088. Where should I keep my medication? Keep out of the reach of children and pets. Store at [employer] [address] (77 degrees F). Protect from light. Get rid of any unused medication after the expiration date. To get rid of medications that are no longer needed or have expired: Take the medication to a medication take-back program. Check with your pharmacy or law enforcement to find a location. If you cannot return the medication, check the label or package insert to see if the medication should be thrown out in the garbage or flushed down the toilet. If you are not sure, ask your care team. If it is safe to put it in the trash, empty the medication out of the container. Mix the medication with cat litter, dirt, coffee grounds, or other unwanted substance. Seal the mixture in a bag or container. Put it in the trash. NOTE: This sheet is a summary. It may not cover all possible information. If you have questions about this medicine, talk to your doctor, pharmacist, or health care provider.  2024 Elsevier/Gold Standard (2023-10-25 00:00:00)Trazodone Tablets What is this medication? TRAZODONE (TRAZ oh done) treats depression. It increases the amount of serotonin in the brain, a  hormone that helps regulate mood. This medicine may be used for other purposes; ask your health care provider or pharmacist if you have questions. COMMON BRAND NAME(S): Desyrel What should I tell my care team before I take this medication? They need to know if you have any of these conditions: Bipolar disorder Bleeding disorder Glaucoma Heart disease, or previous heart attack Irregular heartbeat or rhythm Kidney disease Liver disease Low levels of sodium in the blood Suicidal thoughts, plans, or attempt by you or a family member An unusual or allergic reaction to trazodone, other medications, foods, dyes, or preservatives Pregnant or trying to get pregnant Breastfeeding How should I use this medication? Take this medication by mouth with a glass of water. Take it as directed on the prescription label at the same time every day. Take this medication shortly after a meal or a light snack. Keep taking this medication unless your care team tells you to stop. Stopping it too quickly can cause serious side effects. It can also make your condition worse. A special MedGuide will be given to you by the pharmacist with each prescription and refill. Be sure to read  this information carefully each time. Talk to your care team about the use of this medication in children. Special care may be needed. Overdosage: If you think you have taken too much of this medicine contact a poison control center or emergency room at once. NOTE: This medicine is only for you. Do not share this medicine with others. What if I miss a dose? If you miss a dose, take it as soon as you can. If it is almost time for your next dose, take only that dose. Do not take double or extra doses. What may interact with this medication? Do not take this medication with any of the following: Certain medications for fungal infections, such as fluconazole, itraconazole, ketoconazole, posaconazole,  voriconazole Cisapride Dronedarone Linezolid MAOIs, such as Carbex, Eldepryl, Marplan, Nardil, and Parnate Mesoridazine Methylene blue (injected into a vein) Pimozide Saquinavir Thioridazine This medication may also interact with the following: Alcohol Antiviral medications for HIV or AIDS Aspirin and aspirin-like medications Barbiturates, such as phenobarbital Certain medications for blood pressure, heart disease, irregular heart beat Certain medications for mental health conditions Certain medications for migraine headache, such as almotriptan, eletriptan, frovatriptan, naratriptan, rizatriptan, sumatriptan, zolmitriptan Certain medications for seizures, such as carbamazepine and phenytoin Certain medications for sleep Certain medications that treat or prevent blood clots, such as dalteparin, enoxaparin, warfarin Digoxin Fentanyl Lithium NSAIDS, medications for pain and inflammation, such as ibuprofen or naproxen Other medications that cause heart rhythm changes Rasagiline Supplements, such as St. John's wort, kava kava, valerian Tramadol Tryptophan This list may not describe all possible interactions. Give your health care provider a list of all the medicines, herbs, non-prescription drugs, or dietary supplements you use. Also tell them if you smoke, drink alcohol, or use illegal drugs. Some items may interact with your medicine. What should I watch for while using this medication? Visit your care team for regular checks on your progress. Tell your care team if your symptoms do not start to get better or if they get worse. Because it may take several weeks to see the full effects of this medication, it is important to continue your treatment as prescribed by your care team. Watch for new or worsening thoughts of suicide or depression. This includes sudden changes in mood, behaviors, or thoughts. These changes can happen at any time but are more common in the beginning of treatment  or after a change in dose. Call your care team right away if you experience these thoughts or worsening depression. This medication may cause mood and behavior changes, such as anxiety, nervousness, irritability, hostility, restlessness, excitability, hyperactivity, or trouble sleeping. These changes can happen at any time but are more common in the beginning of treatment or after a change in dose. Call your care team right away if you notice any of these symptoms. This medication may affect your coordination, reaction time, or judgment. Do not drive or operate machinery until you know how this medication affects you. Sit up or stand slowly to reduce the risk of dizzy or fainting spells. Drinking alcohol with this medication can increase the risk of these side effects. This medication may cause dry eyes and blurred vision. If you wear contact lenses you may feel some discomfort. Lubricating drops may help. See your care team if the problem does not go away or is severe. Your mouth may get dry. Chewing sugarless gum or sucking hard candy and drinking plenty of water may help. Contact your care team if the problem does not go away or  is severe. What side effects may I notice from receiving this medication? Side effects that you should report to your care team as soon as possible: Allergic reactions--skin rash, itching, hives, swelling of the face, lips, tongue, or throat Bleeding--bloody or black, tar-like stools, red or dark brown urine, vomiting blood or brown material that looks like coffee grounds, small, red or purple spots on skin, unusual bleeding or bruising Heart rhythm changes--fast or irregular heartbeat, dizziness, feeling faint or lightheaded, chest pain, trouble breathing Low blood pressure--dizziness, feeling faint or lightheaded, blurry vision Low sodium level--muscle weakness, fatigue, dizziness, headache, confusion Prolonged or painful erection Serotonin syndrome--irritability,  confusion, fast or irregular heartbeat, muscle stiffness, twitching muscles, sweating, high fever, seizures, chills, vomiting, diarrhea Sudden eye pain or change in vision such as blurry vision, seeing halos around lights, vision loss Thoughts of suicide or self-harm, worsening mood, feelings of depression Side effects that usually do not require medical attention (report to your care team if they continue or are bothersome): Change in sex drive or performance Constipation Dizziness Drowsiness Dry mouth This list may not describe all possible side effects. Call your doctor for medical advice about side effects. You may report side effects to FDA at 1-800-FDA-1088. Where should I keep my medication? Keep out of the reach of children and pets. Store at room temperature between 15 and 30 degrees C (59 to 86 degrees F). Protect from light. Keep container tightly closed. Throw away any unused medication after the expiration date. NOTE: This sheet is a summary. It may not cover all possible information. If you have questions about this medicine, talk to your doctor, pharmacist, or health care provider.  2024 Elsevier/Gold Standard (2022-11-08 00:00:00)

## 2024-02-11 ENCOUNTER — Other Ambulatory Visit: Payer: Self-pay | Admitting: Family Medicine

## 2024-02-11 DIAGNOSIS — F5104 Psychophysiologic insomnia: Secondary | ICD-10-CM

## 2024-02-11 DIAGNOSIS — F314 Bipolar disorder, current episode depressed, severe, without psychotic features: Secondary | ICD-10-CM

## 2024-02-12 ENCOUNTER — Telehealth: Payer: Managed Care, Other (non HMO) | Admitting: Family Medicine

## 2024-02-12 ENCOUNTER — Ambulatory Visit: Payer: Self-pay | Admitting: Psychiatry

## 2024-02-17 ENCOUNTER — Ambulatory Visit
Admission: RE | Admit: 2024-02-17 | Discharge: 2024-02-17 | Disposition: A | Discharge status: Home / Self Care | Source: Ambulatory Visit | Attending: Psychiatry | Admitting: Psychiatry

## 2024-02-17 ENCOUNTER — Telehealth: Payer: Self-pay | Admitting: Psychiatry

## 2024-02-17 ENCOUNTER — Other Ambulatory Visit
Admission: RE | Admit: 2024-02-17 | Discharge: 2024-02-17 | Disposition: A | Discharge status: Home / Self Care | Source: Ambulatory Visit | Attending: Psychiatry | Admitting: Psychiatry

## 2024-02-17 DIAGNOSIS — Z9189 Other specified personal risk factors, not elsewhere classified: Secondary | ICD-10-CM | POA: Insufficient documentation

## 2024-02-17 DIAGNOSIS — F3161 Bipolar disorder, current episode mixed, mild: Secondary | ICD-10-CM

## 2024-02-17 DIAGNOSIS — Z79899 Other long term (current) drug therapy: Secondary | ICD-10-CM | POA: Diagnosis not present

## 2024-02-17 LAB — TSH: TSH: 2.789 u[IU]/mL (ref 0.350–4.500)

## 2024-02-17 NOTE — Telephone Encounter (Signed)
 EKG resulted as normal sinus rhythm with sinus arrhythmia.  Dated 02/17/2024.

## 2024-02-18 ENCOUNTER — Telehealth: Payer: Self-pay | Admitting: Psychiatry

## 2024-02-18 NOTE — Telephone Encounter (Signed)
 Pt.notified

## 2024-02-20 LAB — AMPHETAMINE CONF, UR
Amphetamine GC/MS Conf: >3000 ng/mL
Amphetamine, Ur: POSITIVE — AB
Amphetamines: POSITIVE — AB
Methamphetamine, Ur: NEGATIVE

## 2024-02-20 LAB — URINE DRUGS OF ABUSE SCREEN W ALC, ROUTINE (REF LAB)
Barbiturate, Ur: NEGATIVE ng/mL
Benzodiazepine Quant, Ur: NEGATIVE ng/mL
Cannabinoid Quant, Ur: NEGATIVE ng/mL
Cocaine (Metab.): NEGATIVE ng/mL
Ethanol U, Quan: NEGATIVE %
Methadone Screen, Urine: NEGATIVE ng/mL
Opiate Quant, Ur: NEGATIVE ng/mL
Phencyclidine, Ur: NEGATIVE ng/mL
Propoxyphene, Urine: NEGATIVE ng/mL

## 2024-02-25 ENCOUNTER — Other Ambulatory Visit (HOSPITAL_COMMUNITY): Payer: Self-pay | Admitting: Psychiatry

## 2024-02-25 DIAGNOSIS — F3161 Bipolar disorder, current episode mixed, mild: Secondary | ICD-10-CM

## 2024-02-25 DIAGNOSIS — F4310 Post-traumatic stress disorder, unspecified: Secondary | ICD-10-CM

## 2024-02-26 ENCOUNTER — Telehealth: Payer: Self-pay | Admitting: Psychiatry

## 2024-02-26 DIAGNOSIS — F902 Attention-deficit hyperactivity disorder, combined type: Secondary | ICD-10-CM

## 2024-02-26 DIAGNOSIS — R41840 Attention and concentration deficit: Secondary | ICD-10-CM

## 2024-02-26 MED ORDER — LISDEXAMFETAMINE DIMESYLATE 40 MG PO CAPS: 40.0000 mg | ORAL_CAPSULE | ORAL | 0 refills | Status: DC

## 2024-02-26 NOTE — Telephone Encounter (Signed)
 Reviewed ADHD testing report from [facility]. I will go ahead and sent Vyvanse 40 mg to pharmacy at [facility] [address]  Patient aware.

## 2024-03-01 ENCOUNTER — Other Ambulatory Visit (HOSPITAL_COMMUNITY): Payer: Self-pay | Admitting: Psychiatry

## 2024-03-01 DIAGNOSIS — F3161 Bipolar disorder, current episode mixed, mild: Secondary | ICD-10-CM

## 2024-03-02 ENCOUNTER — Other Ambulatory Visit (HOSPITAL_COMMUNITY): Payer: Self-pay | Admitting: Psychiatry

## 2024-03-02 DIAGNOSIS — F3161 Bipolar disorder, current episode mixed, mild: Secondary | ICD-10-CM

## 2024-03-05 ENCOUNTER — Encounter: Payer: Self-pay | Admitting: Psychiatry

## 2024-03-05 ENCOUNTER — Ambulatory Visit (INDEPENDENT_AMBULATORY_CARE_PROVIDER_SITE_OTHER): Admitting: Psychiatry

## 2024-03-05 VITALS — BP 138/82 | HR 80 | Temp 98.6°F | Ht 66.0 in | Wt 167.8 lb

## 2024-03-05 DIAGNOSIS — F4310 Post-traumatic stress disorder, unspecified: Secondary | ICD-10-CM | POA: Diagnosis not present

## 2024-03-05 DIAGNOSIS — F419 Anxiety disorder, unspecified: Secondary | ICD-10-CM

## 2024-03-05 DIAGNOSIS — F902 Attention-deficit hyperactivity disorder, combined type: Secondary | ICD-10-CM

## 2024-03-05 DIAGNOSIS — F3161 Bipolar disorder, current episode mixed, mild: Secondary | ICD-10-CM | POA: Diagnosis not present

## 2024-03-05 DIAGNOSIS — F633 Trichotillomania: Secondary | ICD-10-CM | POA: Diagnosis not present

## 2024-03-05 DIAGNOSIS — F1910 Other psychoactive substance abuse, uncomplicated: Secondary | ICD-10-CM

## 2024-03-05 MED ORDER — LISDEXAMFETAMINE DIMESYLATE 40 MG PO CAPS: 40.0000 mg | ORAL_CAPSULE | ORAL | 0 refills | Status: DC

## 2024-03-05 MED ORDER — LAMOTRIGINE 100 MG PO TABS: 50.0000 mg | ORAL_TABLET | Freq: Two times a day (BID) | ORAL | 0 refills | Status: DC

## 2024-03-05 MED ORDER — LAMOTRIGINE 25 MG PO TABS: 75.0000 mg | ORAL_TABLET | Freq: Every day | ORAL | Status: DC

## 2024-03-05 NOTE — Progress Notes (Unsigned)
 [facility] MD OP Progress Note  03/05/2024 4:36 PM Marciel Offenberger  MRN:  [id number]  Chief Complaint:  Chief Complaint  Patient presents with   Follow-up   Depression   Anxiety   Medication Refill   HPI: Terri Holt is a 33 year old Caucasian female, currently employed, engaged, lives in [place], has a history of bipolar disorder type I, ADHD, PTSD, trichotillomania, anxiety disorder unspecified, history of polysubstance abuse currently in remission was evaluated in office today.  She experiences mood instability with frequent fluctuations before stabilizing. She is on lamotrigine 25 mg twice daily as a mood stabilizer.  Denies suicidal thoughts or actions, though she feels indifferent about waking up. She feels overwhelmed by responsibilities but is motivated by the reliance of others on her.  She is on Vyvanse 40 mg for ADHD, which helps with concentration but does not alleviate tiredness. She has difficulty focusing and maintaining energy levels despite the medication.  Her sleep is disrupted, with trazodone 50 mg at bedtime helping her fall asleep but causing frequent awakenings. She has tried taking half a pill to avoid grogginess, especially on nights when she has to wake up early for school. Hydroxyzine helps her stay asleep longer, but she is cautious about using it when her children are around.  She has a history of a torn labral muscle in the hip and a pulled muscle in the back, for which she is taking diclofenac 75 mg. The hip issue began over two years ago, and she has undergone MRIs and other evaluations. The diclofenac has helped alleviate the pain significantly, leaving only a dull ache.  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 She is engaged and has a six-year-old child.  Visit Diagnosis:    ICD-10-CM   1.  Bipolar 1 disorder, mixed, mild (HCC)  F31.61 lamoTRIgine (LAMICTAL) 25 MG tablet    lamoTRIgine (LAMICTAL) 100 MG tablet    2. PTSD (post-traumatic stress disorder)  F43.10     3. Attention deficit hyperactivity disorder (ADHD), combined type  F90.2 lisdexamfetamine (VYVANSE) 40 MG capsule    4. 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 Anxiety disorder, unspecified type  F41.9     6. Polysubstance abuse (HCC)  F19.10    Cocaine, Xanax, heroin currently in remission      Past Psychiatric History: I have reviewed past psychiatric history from progress note on 02/01/2024.  Past trials of medications like Wellbutrin, sertraline, carbamazepine, fluoxetine, trazodone, Ambien, hydroxyzine.  Patient with 1 suicide attempt by overdose in 2020 after separation from ex-husband.  Patient also with history of self-injurious behaviors like cutting in the past.  Patient with history of ADHD, diagnosed at [facility].  Reviewed records.  Past Medical History:  Past Medical History:  Diagnosis Date   Anxiety    Bipolar I disorder, most recent episode depressed, severe without psychotic features (HCC)    Constipation    Depression    Headache    Mini stroke    Schizophrenia [facility])     Past Surgical History:  Procedure Laterality Date   CESAREAN SECTION     CESAREAN SECTION N/A 02/26/2018   Procedure: REPEAT CESAREAN SECTION;  Surgeon: Hildred Laser, MD;  Location: [facility] ORS;  Service: Obstetrics;  Laterality: N/A;   COLONOSCOPY WITH PROPOFOL N/A 05/13/2023   Procedure: COLONOSCOPY WITH PROPOFOL;  Surgeon: Toney Reil, MD;  Location: [facility] ENDOSCOPY;  Service: Gastroenterology;  Laterality: N/A;   WISDOM TOOTH EXTRACTION      Family Psychiatric History: I have reviewed family psychiatric history from progress note on 02/01/2024.  Family History:  Family History  Problem Relation Age of Onset   Depression Mother    Migraines Mother    ADD / ADHD Father    Depression Father     Asthma Brother    Hyperlipidemia Brother    ADD / ADHD Maternal Aunt    Thyroid disease Maternal Aunt    Cancer Maternal Aunt        melanoma   Heart failure Maternal Grandfather    Hyperlipidemia Maternal Grandfather    Asthma Maternal Grandmother    Hyperlipidemia Maternal Grandmother    Bipolar disorder Paternal Grandfather    Suicidality Paternal Grandfather    Heart failure Paternal Grandmother    Cancer Paternal Grandmother        tumor   ADD / ADHD Cousin    ADD / ADHD Son    Asthma Son     Social History: I have reviewed social history from progress note on 02/01/2024. Social History   Socioeconomic History   Marital status: Divorced    Spouse name: Not on file   Number of children: Not on file   Years of education: Not on file   Highest education level: Associate degree: occupational, Scientist, product/process development, or vocational program  Occupational History   Not on file  Tobacco Use   Smoking status: Former    Current packs/day: 0.00    Average packs/day: 0.5 packs/day for 4.0 years (2.0 ttl pk-yrs)    Types: Cigarettes    Start date: 06/26/2013    Quit date: 06/26/2017    Years since quitting: 6.6   Smokeless tobacco: Never  Vaping Use   Vaping status: Some Days  Substance and Sexual Activity   Alcohol use: Not Currently    Alcohol/week: 16.0 standard drinks of alcohol    Types: 16 Glasses of wine per week    Comment: wine at night, occasionally   Drug use: Not Currently    Types: Amphetamines   Sexual activity: Yes    Partners: Male    Birth control/protection: None, Surgical    Comment: Tubal Ligation  Other Topics Concern   Not on file  Social History Narrative   ** Merged History Encounter **       Social Drivers of Health   Financial Resource Strain: Low Risk  (03/20/2023)   Overall Financial Resource Strain (CARDIA)    Difficulty of Paying Living Expenses: Not hard at all  Food Insecurity: No Food Insecurity (03/20/2023)   Hunger Vital Sign    Worried About  Running Out of Food in the Last Year: Never true    Ran Out of Food in the Last Year: Never true  Transportation Needs: No Transportation Needs (03/20/2023)   PRAPARE - Administrator, Civil Service (Medical): No    Lack of Transportation (Non-Medical): No  Physical Activity: Sufficiently Active (03/20/2023)   Exercise Vital Sign    Days of Exercise per Week: 5 days    Minutes of Exercise per Session: 30 min  Stress: Stress Concern Present (03/20/2023)   [employer] of Occupational Health - Occupational Stress Questionnaire    Feeling of Stress : Very much  Social Connections: Socially Isolated (03/20/2023)   Social Connection and Isolation Panel [NHANES]    Frequency of Communication with Friends and Family: More than  three times a week    Frequency of Social Gatherings with Friends and Family: Twice a week    Attends Religious Services: Never    Database administrator or Organizations: No    Attends Engineer, structural: Not on file    Marital Status: Divorced    Allergies:  Allergies  Allergen Reactions   Amoxicillin Hives and Rash   Penicillins Hives and Rash    Has patient had a PCN reaction causing immediate rash, facial/tongue/throat swelling, SOB or lightheadedness with hypotension: Yes Has patient had a PCN reaction causing severe rash involving mucus membranes or skin necrosis: Yes Has patient had a PCN reaction that required hospitalization: No Has patient had a PCN reaction occurring within the last 10 years: Yes If all of the above answers are "NO", then may proceed with Cephalosporin use.    Adhesive [Tape]     Pull skin off.  Both paper tape and tegaderm are ok   Latex Rash   Nickel Hives and Rash    Metabolic Disorder Labs: Lab Results  Component Value Date   HGBA1C 4.7 (L) 01/31/2019   MPG 88.19 01/31/2019   No results found for: "PROLACTIN" Lab Results  Component Value Date   CHOL 120 01/31/2019   TRIG 147 01/31/2019   HDL 50  01/31/2019   CHOLHDL 2.4 01/31/2019   VLDL 29 01/31/2019   LDLCALC 41 01/31/2019   Lab Results  Component Value Date   TSH 2.789 02/17/2024   TSH 1.540 10/04/2022    Therapeutic Level Labs: No results found for: "LITHIUM" No results found for: "VALPROATE" Lab Results  Component Value Date   CBMZ 3.4 (L) 06/11/2019    Current Medications: Current Outpatient Medications  Medication Sig Dispense Refill   diclofenac (VOLTAREN) 75 MG EC tablet Take 75 mg twice daily for 1 month, then decrease to 75 mg once daily for 1 month, then stop taking.     hydrOXYzine (VISTARIL) 50 MG capsule Take 1 capsule (50 mg total) by mouth 3 (three) times daily as needed for anxiety. 90 capsule 1   lamoTRIgine (LAMICTAL) 100 MG tablet Take 0.5 tablets (50 mg total) by mouth 2 (two) times daily. Dose increase , start after stopping Lamictal 75 mg daily in 2 weeks from now 90 tablet 0   traZODone (DESYREL) 50 MG tablet Take 0.5-1 tablets (25-50 mg total) by mouth at bedtime as needed for sleep. 30 tablet 1   Vitamin D, Ergocalciferol, (DRISDOL) 1.25 MG (50000 UNIT) CAPS capsule Take 1 capsule (50,000 Units total) by mouth every 7 (seven) days. 26 capsule 1   lamoTRIgine (LAMICTAL) 25 MG tablet Take 3 tablets (75 mg total) by mouth daily for 14 days.     [START ON 03/30/2024] lisdexamfetamine (VYVANSE) 40 MG capsule Take 1 capsule (40 mg total) by mouth every morning. 30 capsule 0   Multiple Vitamin (MULTI-VITAMIN) tablet Take 1 tablet by mouth daily.     No current facility-administered medications for this visit.     Musculoskeletal: Strength & Muscle Tone: within normal limits Gait & Station: normal Patient leans: N/A  Psychiatric Specialty Exam: Review of Systems  Psychiatric/Behavioral:  Positive for decreased concentration, dysphoric mood and sleep disturbance. The patient is nervous/anxious.     Blood pressure 138/82, pulse 80, temperature 98.6 F (37 C), temperature source Temporal, height 5'  6" (1.676 m), weight 167 lb 12.8 oz (76.1 kg), SpO2 100%.Body mass index is 27.08 kg/m.  General Appearance: Casual  Eye Contact:  Fair  Speech:  Clear and Coherent  Volume:  Normal  Mood:  Anxious and Depressed  Affect:  Congruent  Thought Process:  Goal Directed and Descriptions of Associations: Intact  Orientation:  Full (Time, Place, and Person)  Thought Content: Logical   Suicidal Thoughts:  No  Homicidal Thoughts:  No  Memory:  Immediate;   Fair Recent;   Fair Remote;   Fair  Judgement:  Fair  Insight:  Fair  Psychomotor Activity:  Normal  Concentration:  Concentration: Fair and Attention Span: Fair  Recall:  [employer] of Knowledge: Fair  Language: Fair  Akathisia:  No  Handed:  Right  AIMS (if indicated): not done  Assets:  Desire for Improvement Housing Social Support  ADL's:  Intact  Cognition: WNL  Sleep:  Poor   Screenings: AIMS    Flowsheet Row Admission (Discharged) from 01/31/2019 in [facility]  AIMS Total Score 0      AUDIT    Flowsheet Row Admission (Discharged) from 01/31/2019 in [facility]IC[facility]  Alcohol Use Disorder Identification Test Final Score (AUDIT) 0      GAD-7    Flowsheet Row Office Visit from 03/05/2024 in [facility] Office Visit from 01/17/2024 in [facility] Office Visit from 04/17/2023 in [facility]  Total GAD-7 Score 15 21 21       PHQ2-9    Flowsheet Row Office Visit from 03/05/2024 in [facility] Office Visit from 01/17/2024 in [facility] Office Visit from 04/17/2023 in [facility] Office Visit from 03/20/2023 in [facility] Office Visit from 12/14/2022 in [facility]  PHQ-2 Total Score 6 6 6 6 4   PHQ-9 Total Score 22 24 24 24 20       Flowsheet Row  Office Visit from 03/05/2024 in [facility] Office Visit from [facility] Admission (Discharged) from 05/13/2023 in [facility]  C-SSRS RISK CATEGORY Moderate Risk Moderate Risk No Risk        Assessment and Plan: Itali Mckendry is a 33 year old Caucasian female, has a history of bipolar disorder, PTSD, trichotillomania, ADHD, anxiety disorder unspecified was evaluated in office today.  Discussed assessment and plan as noted below.  Bipolar Disorder-unstable Bipolar disorder with mood instability despite current lamotrigine 25 mg BID. Reports fatigue, feeling overwhelmed, and difficulty focusing. Current dose is subtherapeutic; dose adjustment is necessary. Discussed potential need for 200-300 mg daily for optimal stabilization, with individual variability in response. - Increase Lamotrigine to 75 mg daily for two weeks, then to 100 mg daily. - Option to divide 100 mg dose into morning and evening if preferred. - Send prescription for 100 mg lamotrigine to pharmacy.  Post-Traumatic Stress Disorder (PTSD)-unstable PTSD with upcoming trauma therapy intake in one month. Attendance is crucial due to full booking. Coordination with workplace to ensure participation. - Encourage attendance at scheduled trauma therapy intake. - Consider adding an SSRI in the future. - Continue Trazodone half to 1 tablet of 50 mg at bedtime as needed.  Attention-Deficit/Hyperactivity Disorder (ADHD)-improving ADHD managed with Vyvanse 40 mg, effective for concentration but not fatigue. Increasing dose not advisable due to potential anxiety and sleep disturbances. Current dose effective outside depressive episodes. - Continue Vyvanse 40 mg daily. - Send prescription for Vyvanse to pharmacy. - Reviewed [place] PMP  AWARxE   Anxiety disorder  unspecified/Trichotillomania-improving Trichotillomania, specifically eyelash pulling, acknowledged but not primary focus of visit. - We will consider adding an SSRI in the future, patient declined trial previously. - Continue Hydroxyzine 50 mg as needed - Patient to establish care with therapist.  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 - UDS reviewed 02/17/2024.  Positive for amphetamines as prescribed.  Follow-up Follow-up in six to seven weeks via video visit to evaluate lamotrig   Collaboration of Care: Collaboration of Care: Referral or follow-up with counselor/therapist AEB patient to establish  care with therapist.  Patient/Guardian was advised Release of Information must be obtained prior to any record release in order to collaborate their care with an outside provider. Patient/Guardian was advised if they have not already done so to contact the registration department to sign all necessary forms in order for [place] to release information regarding their care.   Consent: Patient/Guardian gives verbal consent for treatment and assignment of benefits for services provided during this visit. Patient/Guardian expressed understanding and agreed to proceed.    Jomarie Longs, MD 03/05/2024, 4:36 PM

## 2024-04-21 ENCOUNTER — Telehealth: Admitting: Psychiatry

## 2024-04-21 DIAGNOSIS — F1991 Other psychoactive substance use, unspecified, in remission: Secondary | ICD-10-CM

## 2024-04-21 DIAGNOSIS — F633 Trichotillomania: Secondary | ICD-10-CM

## 2024-04-21 DIAGNOSIS — F902 Attention-deficit hyperactivity disorder, combined type: Secondary | ICD-10-CM

## 2024-04-21 DIAGNOSIS — F419 Anxiety disorder, unspecified: Secondary | ICD-10-CM | POA: Diagnosis not present

## 2024-04-21 DIAGNOSIS — F4310 Post-traumatic stress disorder, unspecified: Secondary | ICD-10-CM

## 2024-04-21 DIAGNOSIS — Z87898 Personal history of other specified conditions: Secondary | ICD-10-CM

## 2024-04-21 DIAGNOSIS — F3177 Bipolar disorder, in partial remission, most recent episode mixed: Secondary | ICD-10-CM

## 2024-04-21 MED ORDER — LISDEXAMFETAMINE DIMESYLATE 40 MG PO CAPS
40.0000 mg | ORAL_CAPSULE | ORAL | 0 refills | Status: AC
Start: 2024-05-12 — End: 2024-06-11

## 2024-04-21 NOTE — Progress Notes (Signed)
 Virtual Visit via Video Note  I connected with Terri Holt on 04/21/24 at 11:40 AM EDT by a video enabled telemedicine application and verified that I am speaking with the correct person using two identifiers.  Location Provider Location : [facility] Patient Location : Car  Participants: Patient , Provider   I discussed the limitations of evaluation and management by telemedicine and the availability of in person appointments. The patient expressed understanding and agreed to proceed.   I discussed the assessment and treatment plan with the patient. The patient was provided an opportunity to ask questions and all were answered. The patient agreed with the plan and demonstrated an understanding of the instructions.   The patient was advised to call back or seek an in-person evaluation if the symptoms worsen or if the condition fails to improve as anticipated.   BH MD OP Progress Note  04/22/2024 12:56 PM Jalila Goodnough  MRN:  [id number]  Chief Complaint:  Chief Complaint  Patient presents with   Follow-up   ADD   Anxiety   Depression   Medication Refill   Discussed the use of AI scribe software for clinical note transcription with the patient, who gave verbal consent to proceed.  History of Present Illness Terri Holt is a 33 year old Caucasian female, currently employed, engaged, lives in [place], has a history of bipolar disorder type I, ADHD, PTSD, trichotillomania, anxiety disorder unspecified, history of polysubstance abuse currently in remission was evaluated by telemedicine today.  She is currently taking lamotrigine , trazodone , Vyvanse , and hydroxyzine  for her psychiatric conditions. Recently, her lamotrigine  dose was increased to 100 mg. Hydroxyzine  50 mg is taken as needed for anxiety, which she finds helpful, especially when taken earlier in the evening.  She experiences significant sleep disturbances, including difficulty falling  asleep, and only gets about 4 to 5 hours of sleep per night. Taking hydroxyzine  earlier in the evening has helped her fall asleep more easily.  The trazodone  makes her drowsy when she takes it.  She has tried melatonin in the past without success and is exploring other options to improve her sleep hygiene.  She experiences frequent nightmares, which she attributes to stress related to her ongoing court proceedings and issues with her ex-husband. She has a history of PTSD and reports that these nightmares are often related to her previous marriage. She is starting therapy with Ms.Terri Holt and has her first appointment scheduled for tomorrow. She has some anxiety about starting therapy but recognizes its importance.  Denies thoughts of self-harm or harm to others. She denies current use of illicit drugs or alcohol.     Visit Diagnosis:    ICD-10-CM   1. Bipolar disorder, in partial remission, most recent episode mixed (HCC)  F31.77     2. PTSD (post-traumatic stress disorder)  F43.10     3. Attention deficit hyperactivity disorder (ADHD), combined type  F90.2 lisdexamfetamine  (VYVANSE ) 40 MG capsule    4. 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 Anxiety disorder, unspecified type  F41.9     6. History of substance use disorder  Z87.898    Cocaine, Xanax, heroin currently in remission.      Past Psychiatric History: I have reviewed the psychiatric history from progress note on 02/01/2024.  Past trials of medications like Wellbutrin , sertraline , carbamazepine , fluoxetine , trazodone , Ambien , hydroxyzine .  Patient with 1 suicide attempt by overdose in 2020 after separation from ex-husband.  Patient also with history of self-injurious behaviors like  cutting.  Patient with history of ADHD, diagnosed at [facility].  Past Medical History:  Past Medical History:  Diagnosis Date   Anxiety    Bipolar I disorder, most recent episode depressed, severe without psychotic features (HCC)     Constipation    Depression    Headache    Mini stroke    Schizophrenia (HCC)     Past Surgical History:  Procedure Laterality Date   CESAREAN SECTION     CESAREAN SECTION N/A 02/26/2018   Procedure: REPEAT CESAREAN SECTION;  Surgeon: Connell Davies, MD;  Location: [facility] ORS;  Service: Obstetrics;  Laterality: N/A;   COLONOSCOPY WITH PROPOFOL  N/A 05/13/2023   Procedure: COLONOSCOPY WITH PROPOFOL ;  Surgeon: Unk Corinn Skiff, MD;  Location: [facility] ENDOSCOPY;  Service: Gastroenterology;  Laterality: N/A;   WISDOM TOOTH EXTRACTION      Family Psychiatric History: I have reviewed family psychiatric history from progress note on 02/01/2024.  Family History:  Family History  Problem Relation Age of Onset   Depression Mother    Migraines Mother    ADD / ADHD Father    Depression Father    Asthma Brother    Hyperlipidemia Brother    ADD / ADHD Maternal Aunt    Thyroid  disease Maternal Aunt    Cancer Maternal Aunt        melanoma   Heart failure Maternal Grandfather    Hyperlipidemia Maternal Grandfather    Asthma Maternal Grandmother    Hyperlipidemia Maternal Grandmother    Bipolar disorder Paternal Grandfather    Suicidality Paternal Grandfather    Heart failure Paternal Grandmother    Cancer Paternal Grandmother        tumor   ADD / ADHD Cousin    ADD / ADHD Son    Asthma Son     Social History: I have reviewed social history from progress note on 02/01/2024. Social History   Socioeconomic History   Marital status: Divorced    Spouse name: Not on file   Number of children: Not on file   Years of education: Not on file   Highest education level: Associate degree: occupational, scientist, product/process development, or vocational program  Occupational History   Not on file  Tobacco Use   Smoking status: Former    Current packs/day: 0.00    Average packs/day: 0.5 packs/day for 4.0 years (2.0 ttl pk-yrs)    Types: Cigarettes    Start date: 06/26/2013    Quit date: 06/26/2017    Years since  quitting: 6.8   Smokeless tobacco: Never  Vaping Use   Vaping status: Some Days  Substance and Sexual Activity   Alcohol use: Not Currently    Alcohol/week: 16.0 standard drinks of alcohol    Types: 16 Glasses of wine per week    Comment: wine at night, occasionally   Drug use: Not Currently    Types: Amphetamines   Sexual activity: Yes    Partners: Male    Birth control/protection: None, Surgical    Comment: Tubal Ligation  Other Topics Concern   Not on file  Social History Narrative   ** Merged History Encounter **       Social Drivers of Health   Financial Resource Strain: Low Risk  (03/20/2023)   Overall Financial Resource Strain (CARDIA)    Difficulty of Paying Living Expenses: Not hard at all  Food Insecurity: No Food Insecurity (03/20/2023)   Hunger Vital Sign    Worried About Running Out of Food in the Last  Year: Never true    Ran Out of Food in the Last Year: Never true  Transportation Needs: No Transportation Needs (03/20/2023)   PRAPARE - Administrator, Civil Service (Medical): No    Lack of Transportation (Non-Medical): No  Physical Activity: Sufficiently Active (03/20/2023)   Exercise Vital Sign    Days of Exercise per Week: 5 days    Minutes of Exercise per Session: 30 min  Stress: Stress Concern Present (03/20/2023)   [employer] of Occupational Health - Occupational Stress Questionnaire    Feeling of Stress : Very much  Social Connections: Socially Isolated (03/20/2023)   Social Connection and Isolation Panel [NHANES]    Frequency of Communication with Friends and Family: More than three times a week    Frequency of Social Gatherings with Friends and Family: Twice a week    Attends Religious Services: Never    Database Administrator or Organizations: No    Attends Engineer, Structural: Not on file    Marital Status: Divorced    Allergies:  Allergies  Allergen Reactions   Amoxicillin Hives and Rash   Penicillins Hives and Rash     Has patient had a PCN reaction causing immediate rash, facial/tongue/throat swelling, SOB or lightheadedness with hypotension: Yes Has patient had a PCN reaction causing severe rash involving mucus membranes or skin necrosis: Yes Has patient had a PCN reaction that required hospitalization: No Has patient had a PCN reaction occurring within the last 10 years: Yes If all of the above answers are NO, then may proceed with Cephalosporin use.    Adhesive [Tape]     Pull skin off.  Both paper tape and tegaderm are ok   Latex Rash   Nickel Hives and Rash    Metabolic Disorder Labs: Lab Results  Component Value Date   HGBA1C 4.7 (L) 01/31/2019   MPG 88.19 01/31/2019   No results found for: PROLACTIN Lab Results  Component Value Date   CHOL 120 01/31/2019   TRIG 147 01/31/2019   HDL 50 01/31/2019   CHOLHDL 2.4 01/31/2019   VLDL 29 01/31/2019   LDLCALC 41 01/31/2019   Lab Results  Component Value Date   TSH 2.789 02/17/2024   TSH 1.540 10/04/2022    Therapeutic Level Labs: No results found for: LITHIUM No results found for: VALPROATE Lab Results  Component Value Date   CBMZ 3.4 (L) 06/11/2019    Current Medications: Current Outpatient Medications  Medication Sig Dispense Refill   diclofenac (VOLTAREN) 75 MG EC tablet Take 75 mg twice daily for 1 month, then decrease to 75 mg once daily for 1 month, then stop taking.     hydrOXYzine  (VISTARIL ) 50 MG capsule Take 1 capsule (50 mg total) by mouth 3 (three) times daily as needed for anxiety. 90 capsule 1   lamoTRIgine  (LAMICTAL ) 100 MG tablet Take 0.5 tablets (50 mg total) by mouth 2 (two) times daily. Dose increase , start after stopping Lamictal  75 mg daily in 2 weeks from now 90 tablet 0   [START ON 05/12/2024] lisdexamfetamine  (VYVANSE ) 40 MG capsule Take 1 capsule (40 mg total) by mouth every morning. 30 capsule 0   Multiple Vitamin (MULTI-VITAMIN) tablet Take 1 tablet by mouth daily.     traZODone  (DESYREL ) 50 MG  tablet Take 0.5-1 tablets (25-50 mg total) by mouth at bedtime as needed for sleep. 30 tablet 1   Vitamin D , Ergocalciferol , (DRISDOL ) 1.25 MG (50000 UNIT) CAPS capsule Take 1 capsule (  50,000 Units total) by mouth every 7 (seven) days. 26 capsule 1   No current facility-administered medications for this visit.     Musculoskeletal: Strength & Muscle Tone: UTA Gait & Station: Seated Patient leans: N/A  Psychiatric Specialty Exam: Review of Systems  Psychiatric/Behavioral:  Positive for sleep disturbance. The patient is nervous/anxious.     There were no vitals taken for this visit.There is no height or weight on file to calculate BMI.  General Appearance: Casual  Eye Contact:  Fair  Speech:  Clear and Coherent  Volume:  Normal  Mood:  Anxious  Affect:  Congruent  Thought Process:  Goal Directed and Descriptions of Associations: Intact  Orientation:  Full (Time, Place, and Person)  Thought Content: Logical   Suicidal Thoughts:  No  Homicidal Thoughts:  No  Memory:  Immediate;   Fair Recent;   Fair Remote;   Fair  Judgement:  Fair  Insight:  Good  Psychomotor Activity:  Normal  Concentration:  Concentration: Fair and Attention Span: Fair  Recall:  [employer] of Knowledge: Fair  Language: Fair  Akathisia:  No  Handed:  Right  AIMS (if indicated): not done  Assets:  Communication Skills Desire for Improvement Housing Social Support Transportation  ADL's:  Intact  Cognition: WNL  Sleep:  improving   Screenings: AIMS    Flowsheet Row Admission (Discharged) from 01/31/2019 in [facility]  AIMS Total Score 0      AUDIT    Flowsheet Row Admission (Discharged) from 01/31/2019 in [facility]  Alcohol Use Disorder Identification Test Final Score (AUDIT) 0      GAD-7    Flowsheet Row Office Visit from 03/05/2024 in [facility] Office Visit from 01/17/2024 in [facility]  Family Practice Office Visit from 04/17/2023 in [facility]  Total GAD-7 Score 15 21 21       PHQ2-9    Flowsheet Row Office Visit from 03/05/2024 in [facility] Office Visit from 01/17/2024 in [facility] Office Visit from 04/17/2023 in [facility] Office Visit from 03/20/2023 in [facility] Office Visit from 12/14/2022 in [facility]  PHQ-2 Total Score 6 6 6 6 4   PHQ-9 Total Score 22 24 24 24 20       Flowsheet Row Video Visit from 04/21/2024 in [facility] Office Visit from 03/05/2024 in [facility] Office Visit from [facility]/01/2024 [facility]  C-SSRS RISK CATEGORY Moderate Risk Moderate Risk Moderate Risk        Assessment and Plan: Terri Holt is a 33 year old Caucasian female who has a history of bipolar disorder, PTSD, ADHD, anxiety disorder was evaluated by telemedicine today.  Discussed assessment and plan as noted below.  Bipolar disorder type I in partial remission Currently reports the current dosage of Lamictal  as beneficial for mood stabilization and reports mood symptoms as improving. - Continue Lamotrigine  100 mg daily  Posttraumatic stress disorder-unstable Currently does report intrusive memories and nightmares due to recent stressors especially custody battle of her children with her ex-husband. - Continue Trazodone  half to 1 tablet of 50 mg at bedtime as needed - Could use Hydroxyzine  50 mg at bedtime as needed for sleep and anxiety - Discussed starting Melatonin combination medications with magnesium , ashwagandha. - Encouraged to start psychotherapy sessions, has upcoming  appointment with Ms. Perkins. - Discussed initiation of Prazosin if needed for nightmares in the  future.  Currently declines.  Anxiety disorder/Trichotillomania-unstable Anxiety mostly situational. - Encouraged to start psychotherapy sessions. 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 - Will continue to monitor closely.  Follow-up Follow-up in clinic in 6 to 7 weeks or sooner if needed.   Collaboration of Care: Collaboration of Care: Referral or follow-up with counselor/therapist AEB encouraged to continue CBT has upcoming appointment.  Patient/Guardian was advised Release of Information must be obtained prior to any record release in order to collaborate their care with an outside provider. Patient/Guardian was advised if they have not already done so to contact the registration department to sign all necessary forms in order for us  to release information regarding their care.   Consent: Patient/Guardian gives verbal consent for treatment and assignment of benefits for services provided during this visit. Patient/Guardian expressed understanding and agreed to proceed.  This note was generated in part or whole with voice recognition software. Voice recognition is usually quite accurate but there are transcription errors that can and very often do occur. I apologize for any typographical errors that were not detected and corrected.     Tamanna Whitson, MD 04/22/2024, 12:56 PM

## 2024-04-22 ENCOUNTER — Telehealth: Payer: Self-pay

## 2024-04-22 ENCOUNTER — Encounter: Payer: Self-pay | Admitting: Psychiatry

## 2024-04-22 ENCOUNTER — Ambulatory Visit (INDEPENDENT_AMBULATORY_CARE_PROVIDER_SITE_OTHER): Admitting: Licensed Clinical Social Worker

## 2024-04-22 DIAGNOSIS — F4310 Post-traumatic stress disorder, unspecified: Secondary | ICD-10-CM

## 2024-04-22 DIAGNOSIS — F633 Trichotillomania: Secondary | ICD-10-CM | POA: Diagnosis not present

## 2024-04-22 DIAGNOSIS — F902 Attention-deficit hyperactivity disorder, combined type: Secondary | ICD-10-CM

## 2024-04-22 DIAGNOSIS — F419 Anxiety disorder, unspecified: Secondary | ICD-10-CM

## 2024-04-22 DIAGNOSIS — F3177 Bipolar disorder, in partial remission, most recent episode mixed: Secondary | ICD-10-CM
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
## 2024-04-22 NOTE — Telephone Encounter (Signed)
 Patient came into the office today for a therapy appointment per Dr Tere Felts she needed to have a her blood pressure checked 128/85  90

## 2024-04-23 ENCOUNTER — Telehealth: Admitting: Psychiatry

## 2024-05-06 ENCOUNTER — Encounter: Payer: Self-pay | Admitting: Family Medicine

## 2024-05-06 ENCOUNTER — Ambulatory Visit: Admitting: Family Medicine

## 2024-05-06 ENCOUNTER — Ambulatory Visit: Payer: Self-pay

## 2024-05-06 VITALS — BP 113/82 | HR 87 | Ht 66.0 in | Wt 155.0 lb

## 2024-05-06 DIAGNOSIS — Z8669 Personal history of other diseases of the nervous system and sense organs: Secondary | ICD-10-CM | POA: Diagnosis not present

## 2024-05-06 DIAGNOSIS — R112 Nausea with vomiting, unspecified: Secondary | ICD-10-CM

## 2024-05-06 DIAGNOSIS — G43101 Migraine with aura, not intractable, with status migrainosus: Secondary | ICD-10-CM | POA: Diagnosis not present

## 2024-05-06 MED ORDER — PREDNISONE 20 MG PO TABS: 20.0000 mg | ORAL_TABLET | Freq: Two times a day (BID) | ORAL | 0 refills | Status: AC

## 2024-05-06 MED ORDER — METHYLPREDNISOLONE ACETATE 40 MG/ML IJ SUSP
40.0000 mg | Freq: Once | INTRAMUSCULAR | Status: AC
  Administered 2024-05-06: 40 mg via INTRAMUSCULAR

## 2024-05-06 MED ORDER — ONDANSETRON 4 MG PO TBDP
4.0000 mg | ORAL_TABLET | Freq: Three times a day (TID) | ORAL | 0 refills | Status: AC | PRN
Start: 2024-05-06 — End: ?

## 2024-05-06 MED ORDER — SUMATRIPTAN SUCCINATE 25 MG PO TABS: 25.0000 mg | ORAL_TABLET | ORAL | 0 refills | Status: AC | PRN

## 2024-05-06 MED ORDER — KETOROLAC TROMETHAMINE 60 MG/2ML IM SOLN
60.0000 mg | Freq: Once | INTRAMUSCULAR | Status: AC
  Administered 2024-05-06: 30 mg via INTRAMUSCULAR

## 2024-05-06 NOTE — Telephone Encounter (Signed)
 FYI Only or Action Required?: FYI only for provider  Patient was last seen in primary care on 01/17/2024 by Mimi Alt, MD. Called Nurse Triage reporting Migraine. Symptoms began several days ago. Interventions attempted: OTC medications: Excedrin and Rest, hydration, or home remedies. Symptoms are: unchanged.  Triage Disposition: See HCP Within 4 Hours (Or PCP Triage)  Patient/caregiver understands and will follow disposition?: Yes                             Copied from CRM [id number]. Topic: Clinical - Red Word Triage >> May 06, 2024  8:40 AM Ivette P wrote: Red Word that prompted transfer to Nurse Triage: migraine, started on saturday.   havent been able to eat, or drink. naseous Reason for Disposition  [1] Vomiting AND [2] 2 or more times  (Exception: Similar to previous migraines.)  Answer Assessment - Initial Assessment Questions 1. LOCATION: Where does it hurt?      Temples and back of head, pain radiates forward 2. ONSET: When did the headache start? (Minutes, hours or days)      Saturday 3. PATTERN: Does the pain come and go, or has it been constant since it started?     Constant 4. SEVERITY: How bad is the pain? and What does it keep you from doing?  (e.g., Scale 1-10; mild, moderate, or severe)   - MILD (1-3): doesn't interfere with normal activities    - MODERATE (4-7): interferes with normal activities or awakens from sleep    - SEVERE (8-10): excruciating pain, unable to do any normal activities        Rates pain 5-6 5. RECURRENT SYMPTOM: Have you ever had headaches before? If Yes, ask: When was the last time? and What happened that time?      Yes, last migraine was 1-2 years ago 6. CAUSE: What do you think is causing the headache?     Migraine 7. MIGRAINE: Have you been diagnosed with migraine headaches? If Yes, ask: Is this headache similar?      Yes 9. OTHER SYMPTOMS: Do you have any other symptoms?  (fever, stiff neck, eye pain, sore throat, cold symptoms)     States she cannot eat or drink, states she has vomited, denies fever, states she is able to move neck normally, earache Excedrin, cold compress- no relief  Protocols used: Headache-A-AH

## 2024-05-06 NOTE — Progress Notes (Signed)
 ACUTE VISIT   Patient: Terri Holt   DOB: Sep 01, 1991   33 y.o. Female  MRN: [id number]   PCP: Mimi Alt, MD  Chief Complaint  Patient presents with   Migraine    She has had a migraine since Saturday this has never happen before   Subjective    HPI HPI     Migraine    Additional comments: She has had a migraine since Saturday this has never happen before      Last edited by Bart Lieu, CMA on 05/06/2024 10:12 AM.       Discussed the use of AI scribe software for clinical note transcription with the patient, who gave verbal consent to proceed.  History of Present Illness Terri Holt is a 33 year old female with a history of migraines who presents with an acute migraine.  She has been experiencing an acute migraine since Saturday, which is longer than any previous migraine she has had. Light and sound exacerbate her symptoms, and she is unable to retain any food or water, as it is expelled shortly after ingestion.  She has a history of migraines but has not experienced one in about a year and a half. She describes experiencing an aura with her migraines. She has been taking Excedrin to manage the pain but is cautious about taking too much due to her inability to eat. She has not identified any specific triggers for this migraine, such as recent illness or injuries, and it is not associated with her menstrual cycle.  She reports a tingling sensation in her left arm, which she attributes to not eating. No recent illness or injuries that could have triggered the migraine.  Her current medications include Vyvanse , Lamictal  100 mg, trazodone , and vitamin D . She is allergic to amoxicillin, penicillins, tape, latex, and nickel.     Medications: Outpatient Medications Prior to Visit  Medication Sig   diclofenac (VOLTAREN) 75 MG EC tablet Take 75 mg twice daily for 1 month, then decrease to 75 mg once daily for 1 month,  then stop taking.   hydrOXYzine  (VISTARIL ) 50 MG capsule Take 1 capsule (50 mg total) by mouth 3 (three) times daily as needed for anxiety.   lamoTRIgine  (LAMICTAL ) 100 MG tablet Take 0.5 tablets (50 mg total) by mouth 2 (two) times daily. Dose increase , start after stopping Lamictal  75 mg daily in 2 weeks from now   [START ON 05/12/2024] lisdexamfetamine (VYVANSE ) 40 MG capsule Take 1 capsule (40 mg total) by mouth every morning.   Multiple Vitamin (MULTI-VITAMIN) tablet Take 1 tablet by mouth daily.   traZODone  (DESYREL ) 50 MG tablet Take 0.5-1 tablets (25-50 mg total) by mouth at bedtime as needed for sleep.   Vitamin D , Ergocalciferol , (DRISDOL ) 1.25 MG (50000 UNIT) CAPS capsule Take 1 capsule (50,000 Units total) by mouth every 7 (seven) days.   No facility-administered medications prior to visit.        Objective    BP 113/82   Pulse 87   Ht 5' 6 (1.676 m)   Wt 155 lb (70.3 kg)   SpO2 100%   BMI 25.02 kg/m  BP Readings from Last 3 Encounters:  05/06/24 113/82  01/17/24 132/76  12/05/23 118/82   Wt Readings from Last 3 Encounters:  05/06/24 155 lb (70.3 kg)  01/17/24 167 lb (75.8 kg)  12/05/23 164 lb (74.4 kg)      Physical Exam   Physical Exam Physical Exam  VITALS: P- 87, BP- 113/82 GENERAL: Patient appears uncomfortable but not toxic. NEUROLOGICAL: Cranial nerves grossly intact. Mildly reduced strength in left upper extremity. Pupils equal.   No results found for any visits on 05/06/24.  Assessment & Plan      Assessment & Plan Migraine with aura Acute migraine with aura persisting since Saturday, characterized by severe photosensitivity, phonosensitivity, nausea, vomiting, and inability to retain food or water. Tingling in the left arm suggests a possible complex migraine. She has a history of migraines, but none have lasted this long. The goal is to interrupt the migraine cycle and alleviate symptoms. Informed consent obtained for Toradol  and Depo  Medrol injections, with discussion of potential side effects such as dizziness and drowsiness, especially with Phenergan , which was not available. Considered prednisone  as an alternative to samples of migraine abortive medications, which were unavailable. - Administer Toradol  30 mg injection in clinic. - Administer Depo Medrol 40 mg injection in clinic. - Prescribe Zofran  4 mg disintegrating tablets every 8 hours as needed for nausea. - Prescribe Imitrex 25 mg with instructions to take no more than two doses two hours apart within 24 hours if the headache returns. - Prescribe prednisone  20 mg twice daily for 5 days to help break the headache cycle.      No follow-ups on file.        Mimi Alt, MD  [facility] [phone] (phone) [phone] (fax)  [facility]

## 2024-05-08 ENCOUNTER — Other Ambulatory Visit: Payer: Self-pay | Admitting: Family Medicine

## 2024-05-08 ENCOUNTER — Ambulatory Visit: Payer: Self-pay

## 2024-05-08 ENCOUNTER — Other Ambulatory Visit: Payer: Self-pay

## 2024-05-08 ENCOUNTER — Emergency Department
Admission: EM | Admit: 2024-05-08 | Discharge: 2024-05-08 | Disposition: A | Discharge status: Home / Self Care | Attending: Emergency Medicine | Admitting: Emergency Medicine

## 2024-05-08 ENCOUNTER — Emergency Department

## 2024-05-08 DIAGNOSIS — R531 Weakness: Secondary | ICD-10-CM | POA: Diagnosis not present

## 2024-05-08 DIAGNOSIS — F902 Attention-deficit hyperactivity disorder, combined type: Secondary | ICD-10-CM

## 2024-05-08 DIAGNOSIS — R519 Headache, unspecified: Secondary | ICD-10-CM | POA: Insufficient documentation

## 2024-05-08 LAB — CBC WITH DIFFERENTIAL/PLATELET
Abs Immature Granulocytes: 0.04 10*3/uL (ref 0.00–0.07)
Basophils Absolute: 0 10*3/uL (ref 0.0–0.1)
Basophils Relative: 0 %
Eosinophils Absolute: 0 10*3/uL (ref 0.0–0.5)
Eosinophils Relative: 0 %
HCT: 41.3 % (ref 36.0–46.0)
Hemoglobin: 14.3 g/dL (ref 12.0–15.0)
Immature Granulocytes: 1 %
Lymphocytes Relative: 7 %
Lymphs Abs: 0.5 10*3/uL — ABNORMAL LOW (ref 0.7–4.0)
MCH: 30.1 pg (ref 26.0–34.0)
MCHC: 34.6 g/dL (ref 30.0–36.0)
MCV: 86.9 fL (ref 80.0–100.0)
Monocytes Absolute: 0.1 10*3/uL (ref 0.1–1.0)
Monocytes Relative: 1 %
Neutro Abs: 6 10*3/uL (ref 1.7–7.7)
Neutrophils Relative %: 91 %
Platelets: 213 10*3/uL (ref 150–400)
RBC: 4.75 MIL/uL (ref 3.87–5.11)
RDW: 12 % (ref 11.5–15.5)
WBC: 6.6 10*3/uL (ref 4.0–10.5)
nRBC: 0 % (ref 0.0–0.2)

## 2024-05-08 LAB — COMPREHENSIVE METABOLIC PANEL WITH GFR
ALT: 17 U/L (ref 0–44)
AST: 18 U/L (ref 15–41)
Albumin: 4.3 g/dL (ref 3.5–5.0)
Alkaline Phosphatase: 50 U/L (ref 38–126)
Anion gap: 12 (ref 5–15)
BUN: 13 mg/dL (ref 6–20)
CO2: 20 mmol/L — ABNORMAL LOW (ref 22–32)
Calcium: 9.2 mg/dL (ref 8.9–10.3)
Chloride: 105 mmol/L (ref 98–111)
Creatinine, Ser: 0.71 mg/dL (ref 0.44–1.00)
GFR, Estimated: >60 mL/min (ref >60)
Glucose, Bld: 143 mg/dL — ABNORMAL HIGH (ref 70–99)
Potassium: 3.5 mmol/L (ref 3.5–5.1)
Sodium: 137 mmol/L (ref 135–145)
Total Bilirubin: 0.9 mg/dL (ref 0.0–1.2)
Total Protein: 7.4 g/dL (ref 6.5–8.1)

## 2024-05-08 LAB — HCG, QUANTITATIVE, PREGNANCY: hCG, Beta Chain, Quant, S: <1 m[IU]/mL (ref <5)

## 2024-05-08 MED ORDER — SODIUM CHLORIDE 0.9 % IV BOLUS: 1000.0000 mL | Freq: Once | INTRAVENOUS | Status: AC

## 2024-05-08 MED ORDER — GADOBUTROL 1 MMOL/ML IV SOLN
7.0000 mL | Freq: Once | INTRAVENOUS | Status: AC | PRN
Start: 2024-05-08 — End: 2024-05-08

## 2024-05-08 MED ORDER — PROCHLORPERAZINE EDISYLATE 10 MG/2ML IJ SOLN
10.0000 mg | Freq: Once | INTRAMUSCULAR | Status: AC
  Filled 2024-05-08: qty 2

## 2024-05-08 MED ORDER — KETOROLAC TROMETHAMINE 15 MG/ML IJ SOLN
15.0000 mg | Freq: Once | INTRAMUSCULAR | Status: AC
  Filled 2024-05-08: qty 1

## 2024-05-08 MED ORDER — LISDEXAMFETAMINE DIMESYLATE 40 MG PO CAPS: 40.0000 mg | ORAL_CAPSULE | ORAL | 0 refills | Status: DC

## 2024-05-08 MED ORDER — DIPHENHYDRAMINE HCL 50 MG/ML IJ SOLN
25.0000 mg | Freq: Once | INTRAMUSCULAR | Status: AC
  Filled 2024-05-08: qty 1

## 2024-05-08 NOTE — Telephone Encounter (Signed)
 Agree with ED evaluation for headache worsening and neuro symptoms

## 2024-05-08 NOTE — ED Triage Notes (Signed)
 Pt to ED via [facility] from work fr complaints of a migraine and chest pain. Pt states she has had a migraine x1 week. Pt has hx of migraines and stated she woek up with a headache that has gotten worse throughout the day. Pt states she started having L sided weakness on Monday that has gotten worse throughout the day. Pt reports chest pain only during inspiration. Pt endorses N/V since Saturday but pt has not vomited today.   EMS vitals HR 115 BP 150/80 CBG 116

## 2024-05-08 NOTE — Discharge Instructions (Signed)
 Your testing today was fortunately overall reassuring.  Follow-up with your neurologist for further evaluation.  Return to the ER for new or worsening symptoms.

## 2024-05-08 NOTE — Telephone Encounter (Signed)
 Copied from CRM [id number]. Topic: Clinical - Red Word Triage >> May 08, 2024  3:37 PM Alysia Jumbo S wrote: [facility] that prompted transfer to Nurse Triage: severe head pain Reason for Disposition  [1] Numbness of the face, arm or leg on one side of the body AND [2] new-onset  Answer Assessment - Initial Assessment Questions 1. LOCATION: Where does it hurt?      ---- Front of head and radiates down behind R. Ear   2. ONSET: When did the headache start? (Minutes, hours or days)      ----------- Saturday   3. PATTERN: Does the pain come and go, or has it been constant since it started?     -------------------------------- Constant    4. SEVERITY: How bad is the pain? and What does it keep you from doing?  (e.g., Scale 1-10; mild, moderate, or severe)   - MILD (1-3): doesn't interfere with normal activities    - MODERATE (4-7): interferes with normal activities or awakens from sleep    - SEVERE (8-10): excruciating pain, unable to do any normal activities         ----------------------------------7/10    5. RECURRENT SYMPTOM: Have you ever had headaches before? If Yes, ask: When was the last time? and What happened that time?      ---------------- Yes   6. CAUSE: What do you think is causing the headache?     ---- Thinks its a migraine but she is having some new symptoms that don't follow her typical migraine.    7. MIGRAINE: Have you been diagnosed with migraine headaches? If Yes, ask: Is this headache similar?      ----------------- Yes, have a HX   9. OTHER SYMPTOMS: Do you have any other symptoms? (fever, stiff neck, eye pain, sore throat, cold symptoms)      --- Hands shaking, light sensitivity , tingling/numbness on Left arm and left leg. ( Onset: Couple hours ago)     Additional Info:  This nurse relayed that her s/s were urgent and 911 needed to be called.  Patient declined and relayed  Ill call my mom, she lives down the street. Patient  educated on the urgency of her s/s and this nurse offered to call 911. Patient declined for this nurse to call on her behalf, but verbalized understanding and relayed that she will call appropriately.    No additional questions/concerns noted during the time of the call.  Protocols used: [facility]

## 2024-05-08 NOTE — ED Provider Notes (Signed)
 [facility] Provider Note    Event Date/Time   First MD Initiated Contact with Patient 05/08/24 [phone]     (approximate)   History   Headache   HPI  Terri Holt is a 33 year old female with history of migraines presenting to the ER for evaluation of headache.  Patient reports that for the last week she has had an ongoing migraine.  Reports this feels similar to prior headaches, but more severe.  On Monday, she reports that she developed weakness of her left arm and leg with sensory changes.  Saw her doctor on Wednesday and was given Toradol  and steroid.  Reported initial improvement but recurrence of her headache.  Presents to the ER given ongoing symptoms.  Denies history of numbness, tingling associated with her headaches in the past.  I did review patient's outpatient primary care visit from 6/11.  Administered Toradol  and Depo-Medrol  and was discharged home with Zofran  and Imitrex  as well as prednisone .  I also note that she had a telemedicine visit with her neurologist yesterday during which she primarily was evaluated for back pain.    Physical Exam   Triage Vital Signs: ED Triage Vitals [05/08/24 1634]  Encounter Vitals Group     BP 124/76     Girls Systolic BP Percentile      Girls Diastolic BP Percentile      Boys Systolic BP Percentile      Boys Diastolic BP Percentile      Pulse Rate 96     Resp (!) 22     Temp 98.3 F (36.8 C)     Temp Source Oral     SpO2 100 %     Weight 154 lb 5.2 oz (70 kg)     Height 5' 5 (1.651 m)     Head Circumference      Peak Flow      Pain Score 8     Pain Loc      Pain Education      Exclude from Growth Chart     Most recent vital signs: Vitals:   05/08/24 1830 05/08/24 1900  BP: 100/61 104/82  Pulse: 65 72  Resp: 16 15  Temp:    SpO2: 99% 98%     General: Awake, interactive  CV:  Regular rate, good peripheral perfusion.  Resp:  Unlabored respirations, lungs good  auscultation Abd:  Nondistended Neuro:  Alert and oriented, normal extraocular movements, symmetric facial movement, intact but subjectively diminished sensation over the left arm and leg, 4-5 strength of the left arm and leg, 5-5 strength of the right arm and leg.  Normal anger to nose testing.  ED Results / Procedures / Treatments   Labs (all labs ordered are listed, but only abnormal results are displayed) Labs Reviewed  CBC WITH DIFFERENTIAL/PLATELET - Abnormal; Notable for the following components:      Result Value   Lymphs Abs 0.5 (*)    All other components within normal limits  COMPREHENSIVE METABOLIC PANEL WITH GFR - Abnormal; Notable for the following components:   CO2 20 (*)    Glucose, Bld 143 (*)    All other components within normal limits  HCG, QUANTITATIVE, PREGNANCY     EKG EKG independently reviewed and interpreted by myself demonstrates:  EKG demonstrate sinus rhythm rate 94, PR 142, QRS 99, QTc 447, no acute ST changes  RADIOLOGY Imaging independently reviewed and interpreted by myself demonstrates:  MRI/MRV without venous sinus thrombosis or  stroke  Formal Radiology Read:  MR MRV HEAD W WO CONTRAST Result Date: 05/08/2024 CLINICAL DATA:  Dural venous sinus thrombosis suspected; Neuro deficit, acute, stroke suspected. Headache. Right side weakness and decreased sensation. EXAM: MRI HEAD WITHOUT AND WITH CONTRAST MR VENOGRAM HEAD WITHOUT AND WITH CONTRAST TECHNIQUE: Multiplanar, multi-echo pulse sequences of the brain and surrounding structures were acquired without and with intravenous contrast. Angiographic images of the intracranial venous structures were acquired using MRV technique without and with intravenous contrast. CONTRAST:  7mL GADAVIST GADOBUTROL 1 MMOL/ML IV SOLN COMPARISON:  Head CT 04/12/2014.  Head MRI 05/09/2011. FINDINGS: MRI HEAD WITHOUT AND WITH CONTRAST Brain: There is no evidence of an acute infarct, intracranial hemorrhage, mass, midline  shift, or extra-axial fluid collection. Cerebral volume is normal. The ventricles are normal in size. No significant brain parenchymal signal abnormality or abnormal enhancement is identified. A dilated perivascular space is noted in the right basal ganglia. Vascular: Major intracranial vascular flow voids are preserved. Skull and upper cervical spine: Unremarkable bone marrow signal. Sinuses/Orbits: Unremarkable orbits. Paranasal sinuses and mastoid air cells are clear. Other: None. MR VENOGRAM HEAD WITHOUT AND WITH CONTRAST The superior sagittal sinus, internal cerebral veins, vein of Galen, straight sinus, transverse sinuses, sigmoid sinuses, and jugular bulbs are patent without evidence of thrombus or significant stenosis. IMPRESSION: Negative head MRI and MRV. Electronically Signed   By: Aundra Lee M.D.   On: 05/08/2024 21:03   MR Brain W and Wo Contrast Result Date: 05/08/2024 CLINICAL DATA:  Dural venous sinus thrombosis suspected; Neuro deficit, acute, stroke suspected. Headache. Right side weakness and decreased sensation. EXAM: MRI HEAD WITHOUT AND WITH CONTRAST MR VENOGRAM HEAD WITHOUT AND WITH CONTRAST TECHNIQUE: Multiplanar, multi-echo pulse sequences of the brain and surrounding structures were acquired without and with intravenous contrast. Angiographic images of the intracranial venous structures were acquired using MRV technique without and with intravenous contrast. CONTRAST:  7mL GADAVIST GADOBUTROL 1 MMOL/ML IV SOLN COMPARISON:  Head CT 04/12/2014.  Head MRI 05/09/2011. FINDINGS: MRI HEAD WITHOUT AND WITH CONTRAST Brain: There is no evidence of an acute infarct, intracranial hemorrhage, mass, midline shift, or extra-axial fluid collection. Cerebral volume is normal. The ventricles are normal in size. No significant brain parenchymal signal abnormality or abnormal enhancement is identified. A dilated perivascular space is noted in the right basal ganglia. Vascular: Major intracranial vascular  flow voids are preserved. Skull and upper cervical spine: Unremarkable bone marrow signal. Sinuses/Orbits: Unremarkable orbits. Paranasal sinuses and mastoid air cells are clear. Other: None. MR VENOGRAM HEAD WITHOUT AND WITH CONTRAST The superior sagittal sinus, internal cerebral veins, vein of Galen, straight sinus, transverse sinuses, sigmoid sinuses, and jugular bulbs are patent without evidence of thrombus or significant stenosis. IMPRESSION: Negative head MRI and MRV. Electronically Signed   By: Aundra Lee M.D.   On: 05/08/2024 21:03    PROCEDURES:  Critical Care performed: No  Procedures   MEDICATIONS ORDERED IN ED: Medications  sodium chloride  0.9 % bolus 1,000 mL (0 mLs Intravenous Stopped 05/08/24 2039)  ketorolac  (TORADOL ) 15 MG/ML injection 15 mg (15 mg Intravenous Given 05/08/24 1657)  diphenhydrAMINE  (BENADRYL ) injection 25 mg (25 mg Intravenous Given 05/08/24 1659)  prochlorperazine (COMPAZINE) injection 10 mg (10 mg Intravenous Given 05/08/24 1651)  gadobutrol (GADAVIST) 1 MMOL/ML injection 7 mL (7 mLs Intravenous Contrast Given 05/08/24 2021)     IMPRESSION / MDM / ASSESSMENT AND PLAN / ED COURSE  I reviewed the triage vital signs and the nursing notes.  Differential diagnosis includes,  but is not limited to, complex migraine, cerebral venous sinus thrombosis, lower suspicion CVA, clinical history not suggestive of SAH  Patient's presentation is most consistent with acute presentation with potential threat to life or bodily function.  33 year old female presenting to the emergency department for evaluation of headache with associated left-sided weakness and diminished sensation.  Stable vitals on presentation.  Labs reassuring.  Clinical history somewhat concerning for complex migraine, but no history of this in the past.  Outside window for stroke intervention, but with deficits will obtain MRI, MRV to further evaluate.  MRI and MRV fortunately without acute finding.   Patient reassessed and reports improvement in her headache.  She also had improvement in her left-sided weakness.  Suspect likely complex migraine.  She is comfortable discharge home.  Strict return precautions provided.  Patient discharged stable condition with plans for outpatient follow-up with neurology.      FINAL CLINICAL IMPRESSION(S) / ED DIAGNOSES   Final diagnoses:  Acute nonintractable headache, unspecified headache type  Left-sided weakness     Rx / DC Orders   ED Discharge Orders     None        Note:  This document was prepared using Dragon voice recognition software and may include unintentional dictation errors.   Claria Crofts, MD 05/08/24 2226

## 2024-05-19 ENCOUNTER — Ambulatory Visit (INDEPENDENT_AMBULATORY_CARE_PROVIDER_SITE_OTHER): Admitting: Licensed Clinical Social Worker

## 2024-05-19 DIAGNOSIS — F902 Attention-deficit hyperactivity disorder, combined type: Secondary | ICD-10-CM

## 2024-05-19 DIAGNOSIS — F4310 Post-traumatic stress disorder, unspecified: Secondary | ICD-10-CM

## 2024-05-19 DIAGNOSIS — F3177 Bipolar disorder, in partial remission, most recent episode mixed: Secondary | ICD-10-CM | POA: Diagnosis not present

## 2024-05-19 DIAGNOSIS — F419 Anxiety disorder, unspecified: Secondary | ICD-10-CM

## 2024-05-19 DIAGNOSIS — F633 Trichotillomania: Secondary | ICD-10-CM | POA: Diagnosis not present
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
## 2024-05-28 ENCOUNTER — Other Ambulatory Visit (HOSPITAL_COMMUNITY): Payer: Self-pay | Admitting: Psychiatry

## 2024-05-28 DIAGNOSIS — F3161 Bipolar disorder, current episode mixed, mild: Secondary | ICD-10-CM

## 2024-06-02 ENCOUNTER — Telehealth (INDEPENDENT_AMBULATORY_CARE_PROVIDER_SITE_OTHER): Admitting: Psychiatry

## 2024-06-02 ENCOUNTER — Encounter: Payer: Self-pay | Admitting: Psychiatry

## 2024-06-02 DIAGNOSIS — F633 Trichotillomania: Secondary | ICD-10-CM

## 2024-06-02 DIAGNOSIS — Z87898 Personal history of other specified conditions: Secondary | ICD-10-CM

## 2024-06-02 DIAGNOSIS — F902 Attention-deficit hyperactivity disorder, combined type: Secondary | ICD-10-CM | POA: Diagnosis not present

## 2024-06-02 DIAGNOSIS — F4310 Post-traumatic stress disorder, unspecified: Secondary | ICD-10-CM | POA: Diagnosis not present

## 2024-06-02 DIAGNOSIS — F3162 Bipolar disorder, current episode mixed, moderate: Secondary | ICD-10-CM | POA: Diagnosis not present

## 2024-06-02 MED ORDER — LAMOTRIGINE 25 MG PO TABS: 25.0000 mg | ORAL_TABLET | Freq: Every day | ORAL | 0 refills | Status: DC

## 2024-06-02 MED ORDER — LAMOTRIGINE 100 MG PO TABS: 50.0000 mg | ORAL_TABLET | Freq: Two times a day (BID) | ORAL | 0 refills | Status: DC

## 2024-06-02 NOTE — Progress Notes (Unsigned)
 Virtual Visit via Video Note  I connected with Terri Holt on 06/02/24 at 11:20 AM EDT by a video enabled telemedicine application and verified that I am speaking with the correct person using two identifiers.  Location Provider Location : [facility] Patient Location : Work  Participants: Patient , Provider    I discussed the limitations of evaluation and management by telemedicine and the availability of in person appointments. The patient expressed understanding and agreed to proceed.   I discussed the assessment and treatment plan with the patient. The patient was provided an opportunity to ask questions and all were answered. The patient agreed with the plan and demonstrated an understanding of the instructions.   The patient was advised to call back or seek an in-person evaluation if the symptoms worsen or if the condition fails to improve as anticipated.   BH MD OP Progress Note  06/03/2024 8:17 AM Terri Holt  MRN:  [id number]  Chief Complaint:  Chief Complaint  Patient presents with   Follow-up   Anxiety   Depression   Medication Refill   Discussed the use of AI scribe software for clinical note transcription with the patient, who gave verbal consent to proceed.  History of Present Illness Terri Holt is a 33 year old Caucasian female, currently employed, single, lives in [place], has a history of bipolar disorder type I, ADHD, PTSD, trichotillomania, anxiety disorder unspecified, history of polysubstance abuse currently in remission was evaluated by telemedicine today for a follow-up appointment.  She feels 'numb' and lacks motivation, particularly affecting her schoolwork, which she is now attending part-time due to her symptoms. Her anxiety symptoms have decreased, but she tends to 'shut everything down' when anxious rather than taking action. She is able to function at work but feels slow and less productive.  Significant stress  is related to ongoing court proceedings with her ex-husband, which she describes as bringing back previous trauma. Her recent engagement ended two weeks ago, attributed to her partner's inability to handle her depression and mood swings.  Her sleep is disturbed; she sleeps well with trazodone  but experiences grogginess the next morning. Without trazodone  or hydroxyzine , she sleeps only about three hours. With hydroxyzine , she manages four to five hours of sleep. She experiences migraines, with a recent episode lasting several days, requiring Imitrex  25 mg as needed. She missed a week of work due to a complex migraine.  Current medications include Lamictal  100 mg daily, which she has been on for about a month, and Vyvanse  40 mg. She also uses trazodone  50 mg and hydroxyzine  as needed for sleep.   She reports pulling her eyelashes, a behavior she is working on with her therapist, and is planning to start EMDR therapy.  Denies thoughts of self-harm or harm to others, and no hallucinations, but she experiences intrusive memories. She has support from friends who help with her children and household responsibilities.  Visit Diagnosis:    ICD-10-CM   1. Bipolar 1 disorder, mixed, moderate (HCC)  F31.62 lamoTRIgine  (LAMICTAL ) 25 MG tablet    lamoTRIgine  (LAMICTAL ) 100 MG tablet    2. PTSD (post-traumatic stress disorder)  F43.10 lamoTRIgine  (LAMICTAL ) 25 MG tablet    lamoTRIgine  (LAMICTAL ) 100 MG tablet    3. Attention deficit hyperactivity disorder (ADHD), combined type  F90.2     4. Trichotillomania  F63.3 lamoTRIgine  (LAMICTAL ) 100 MG tablet    5. History of substance use disorder  Z87.898    Cocaine, xanax , heroin  in remission      Past Psychiatric History: I have reviewed past psychiatric history from progress note on 02/01/2024.  I have reviewed past psychiatric history from my progress note on 04/21/2024.  Past Medical History:  Past Medical History:  Diagnosis Date   Anxiety     Bipolar I disorder, most recent episode depressed, severe without psychotic features (HCC)    Constipation    Depression    Headache    Migraines    Mini stroke    Schizophrenia [facility])     Past Surgical History:  Procedure Laterality Date   CESAREAN SECTION     CESAREAN SECTION N/A 02/26/2018   Procedure: REPEAT CESAREAN SECTION;  Surgeon: Connell Davies, MD;  Location: [facility] ORS;  Service: Obstetrics;  Laterality: N/A;   COLONOSCOPY WITH PROPOFOL  N/A 05/13/2023   Procedure: COLONOSCOPY WITH PROPOFOL ;  Surgeon: Unk Corinn Skiff, MD;  Location: [facility] ENDOSCOPY;  Service: Gastroenterology;  Laterality: N/A;   WISDOM TOOTH EXTRACTION      Family Psychiatric History: I have reviewed family psychiatric history from progress note on 02/01/2024.  Family History:  Family History  Problem Relation Age of Onset   Depression Mother    Migraines Mother    ADD / ADHD Father    Depression Father    Asthma Brother    Hyperlipidemia Brother    ADD / ADHD Maternal Aunt    Thyroid  disease Maternal Aunt    Cancer Maternal Aunt        melanoma   Heart failure Maternal Grandfather    Hyperlipidemia Maternal Grandfather    Asthma Maternal Grandmother    Hyperlipidemia Maternal Grandmother    Bipolar disorder Paternal Grandfather    Suicidality Paternal Grandfather    Heart failure Paternal Grandmother    Cancer Paternal Grandmother        tumor   ADD / ADHD Cousin    ADD / ADHD Son    Asthma Son     Social History: I have reviewed social history from progress note on 02/01/2024. Social History   Socioeconomic History   Marital status: Divorced    Spouse name: Not on file   Number of children: Not on file   Years of education: Not on file   Highest education level: Associate degree: occupational, Scientist, product/process development, or vocational program  Occupational History   Not on file  Tobacco Use   Smoking status: Former    Current packs/day: 0.00    Average packs/day: 0.5 packs/day for 4.0 years (2.0  ttl pk-yrs)    Types: Cigarettes    Start date: 06/26/2013    Quit date: 06/26/2017    Years since quitting: 6.9   Smokeless tobacco: Never  Vaping Use   Vaping status: Some Days  Substance and Sexual Activity   Alcohol use: Not Currently    Alcohol/week: 16.0 standard drinks of alcohol    Types: 16 Glasses of wine per week    Comment: wine at night, occasionally   Drug use: Not Currently    Types: Amphetamines   Sexual activity: Yes    Partners: Male    Birth control/protection: None, Surgical    Comment: Tubal Ligation  Other Topics Concern   Not on file  Social History Narrative   ** Merged History Encounter **       Social Drivers of Health   Financial Resource Strain: Low Risk  (05/06/2024)   Overall Financial Resource Strain (CARDIA)    Difficulty of Paying Living Expenses: Not hard  at all  Food Insecurity: No Food Insecurity (05/06/2024)   Hunger Vital Sign    Worried About Running Out of Food in the Last Year: Never true    Ran Out of Food in the Last Year: Never true  Transportation Needs: No Transportation Needs (05/06/2024)   PRAPARE - Administrator, Civil Service (Medical): No    Lack of Transportation (Non-Medical): No  Physical Activity: Sufficiently Active (03/20/2023)   Exercise Vital Sign    Days of Exercise per Week: 5 days    Minutes of Exercise per Session: 30 min  Stress: Stress Concern Present (05/06/2024)   [employer] of Occupational Health - Occupational Stress Questionnaire    Feeling of Stress : Very much  Social Connections: Socially Isolated (03/20/2023)   Social Connection and Isolation Panel    Frequency of Communication with Friends and Family: More than three times a week    Frequency of Social Gatherings with Friends and Family: Twice a week    Attends Religious Services: Never    Database administrator or Organizations: No    Attends Engineer, structural: Not on file    Marital Status: Divorced    Allergies:   Allergies  Allergen Reactions   Amoxicillin Hives and Rash   Penicillins Hives and Rash    Has patient had a PCN reaction causing immediate rash, facial/tongue/throat swelling, SOB or lightheadedness with hypotension: Yes Has patient had a PCN reaction causing severe rash involving mucus membranes or skin necrosis: Yes Has patient had a PCN reaction that required hospitalization: No Has patient had a PCN reaction occurring within the last 10 years: Yes If all of the above answers are NO, then may proceed with Cephalosporin use.    Adhesive [Tape]     Pull skin off.  Both paper tape and tegaderm are ok   Latex Rash   Nickel Hives and Rash    Metabolic Disorder Labs: Lab Results  Component Value Date   HGBA1C 4.7 (L) 01/31/2019   MPG 88.19 01/31/2019   No results found for: PROLACTIN Lab Results  Component Value Date   CHOL 120 01/31/2019   TRIG 147 01/31/2019   HDL 50 01/31/2019   CHOLHDL 2.4 01/31/2019   VLDL 29 01/31/2019   LDLCALC 41 01/31/2019   Lab Results  Component Value Date   TSH 2.789 02/17/2024   TSH 1.540 10/04/2022    Therapeutic Level Labs: No results found for: LITHIUM No results found for: VALPROATE Lab Results  Component Value Date   CBMZ 3.4 (L) 06/11/2019    Current Medications: Current Outpatient Medications  Medication Sig Dispense Refill   lamoTRIgine  (LAMICTAL ) 25 MG tablet Take 1 tablet (25 mg total) by mouth daily. Take along with 100 mg daily , total of 125 mg daily 90 tablet 0   diclofenac (VOLTAREN) 75 MG EC tablet Take 75 mg twice daily for 1 month, then decrease to 75 mg once daily for 1 month, then stop taking.     hydrOXYzine  (VISTARIL ) 50 MG capsule Take 1 capsule (50 mg total) by mouth 3 (three) times daily as needed for anxiety. 90 capsule 1   lamoTRIgine  (LAMICTAL ) 100 MG tablet Take 0.5 tablets (50 mg total) by mouth 2 (two) times daily. Take along with 25 mg daily, total of 125 mg daily 90 tablet 0   lisdexamfetamine  (VYVANSE ) 40 MG capsule Take 1 capsule (40 mg total) by mouth every morning. 30 capsule 0   Multiple Vitamin (  MULTI-VITAMIN) tablet Take 1 tablet by mouth daily.     ondansetron  (ZOFRAN -ODT) 4 MG disintegrating tablet Take 1 tablet (4 mg total) by mouth every 8 (eight) hours as needed for nausea or vomiting. 20 tablet 0   SUMAtriptan  (IMITREX ) 25 MG tablet Take 1 tablet (25 mg total) by mouth every 2 (two) hours as needed for migraine. May repeat in 2 hours if headache persists or recurs. 10 tablet 0   traZODone  (DESYREL ) 50 MG tablet Take 0.5-1 tablets (25-50 mg total) by mouth at bedtime as needed for sleep. 30 tablet 1   Vitamin D , Ergocalciferol , (DRISDOL ) 1.25 MG (50000 UNIT) CAPS capsule Take 1 capsule (50,000 Units total) by mouth every 7 (seven) days. 26 capsule 1   No current facility-administered medications for this visit.     Musculoskeletal: Strength & Muscle Tone: UTA Gait & Station: Seated Patient leans: N/A  Psychiatric Specialty Exam: Review of Systems  Psychiatric/Behavioral:  Positive for sleep disturbance. The patient is nervous/anxious.     There were no vitals taken for this visit.There is no height or weight on file to calculate BMI.  General Appearance: Casual  Eye Contact:  Fair  Speech:  Normal Rate  Volume:  Normal  Mood:  Anxious,mood swings , feels numb  Affect:  Congruent  Thought Process:  Goal Directed and Descriptions of Associations: Intact  Orientation:  Full (Time, Place, and Person)  Thought Content: Logical   Suicidal Thoughts:  No  Homicidal Thoughts:  No  Memory:  Immediate;   Fair Recent;   Fair Remote;   Fair  Judgement:  Fair  Insight:  Fair  Psychomotor Activity:  Normal  Concentration:  Concentration: Fair and Attention Span: Fair  Recall:  [employer] of Knowledge: Fair  Language: Fair  Akathisia:  No  Handed:  Right  AIMS (if indicated): not done  Assets:  Communication Skills Desire for Improvement Housing Social  Support Transportation  ADL's:  Intact  Cognition: WNL  Sleep:  improving   Screenings: AIMS    Flowsheet Row Admission (Discharged) from 01/31/2019 in [facility]  AIMS Total Score 0   AUDIT    Flowsheet Row Admission (Discharged) from 01/31/2019 in [facility] INPATIENT BEHAVIORAL MEDICINE  Alcohol Use Disorder Identification Test Final Score (AUDIT) 0   GAD-7    Flowsheet Row Office Visit from 05/06/2024 in [facility] Counselor from 04/22/2024 in [facility] Office Visit from 03/05/2024 in [facility] Office Visit from 01/17/2024 in [facility] Office Visit from 04/17/2023 in [facility]  Total GAD-7 Score 16 19 15 21 21    PHQ2-9    Flowsheet Row Office Visit from 05/06/2024 in [facility] Counselor from 04/22/2024 in [facility] Office Visit from 03/05/2024 in [facility] Office Visit from 01/17/2024 in [facility] Office Visit from 04/17/2023 in [facility]  PHQ-2 Total Score 6 6 6 6 6   PHQ-9 Total Score 24 26 22 24 24    Flowsheet Row Video Visit from 06/02/2024 in [facility] ED from 05/08/2024 in [facility] Emergency Department at [facility] Counselor from 04/22/2024 in [facility]  C-SSRS RISK CATEGORY Moderate Risk Moderate Risk Error: Q3, 4, or 5 should not be populated when Q2 is No     Assessment  and Plan: Terri Holt is a 33 year old Caucasian female who has a history of bipolar disorder, PTSD, ADHD, anxiety disorder was evaluated by telemedicine today.  Discussed assessment and plan as noted below.  Bipolar disorder type I  moderate-unstable Currently struggling with mood swings, multiple situational stressors including court proceedings over custody/child support for her children, recent break-up with her fianc. Increase Lamotrigine  to 125 mg daily Continue psychotherapy sessions with Ms. Evalene Husband, have coordinated care. Discussed referral for partial hospitalization program, patient declines.  Posttraumatic stress disorder-unstable Currently undergoing EMDR which also likely triggering some of her previous trauma and her mood symptoms. Continue Trazodone  half to 1 tablet of 50 mg at bedtime as needed Continue Hydroxyzine  50 mg at bedtime as needed for sleep and anxiety Continue psychotherapy sessions with Ms. Perkins  Trichotillomania-unstable Currently continues to have trichotillomania, pulls out her eyelashes. Encouraged to continue CBT Continue Hydroxyzine  50 mg at bedtime as needed  Attention deficit hyperactivity disorder-unstable Currently struggles with motivation concentration likely due to her worsening situational stresses, trauma related symptoms. Continue Vyvanse  40 mg daily Patient has one prescription for Vyvanse  waiting at her pharmacy send out by primary care provider.  Discussed with patient Vyvanse  being a controlled substance, would recommend being prescribed by one provider only.  Patient to discuss with primary care and will reach out to this provider for future refills as needed. Reviewed [place] AWARxE  Substance use disorder in remission Currently denies any use. Will consider repeating urine drug screen in the future.  Follow-up Follow-up in clinic in 8 weeks or sooner in person.  Collaboration of Care: Collaboration of Care: Referral or follow-up with counselor/therapist AEB patient encouraged to continue psychotherapy sessions with Ms. Perkins, I have coordinated care.  Patient/Guardian was advised Release of Information must be obtained prior to any record release  in order to collaborate their care with an outside provider. Patient/Guardian was advised if they have not already done so to contact the registration department to sign all necessary forms in order for us  to release information regarding their care.   Consent: Patient/Guardian gives verbal consent for treatment and assignment of benefits for services provided during this visit. Patient/Guardian expressed understanding and agreed to proceed.   This note was generated in part or whole with voice recognition software. Voice recognition is usually quite accurate but there are transcription errors that can and very often do occur. I apologize for any typographical errors that were not detected and corrected.    Ephrata Verville, MD 06/03/2024, 8:17 AM

## 2024-06-04 ENCOUNTER — Ambulatory Visit: Admitting: Licensed Clinical Social Worker

## 2024-06-04 DIAGNOSIS — F4310 Post-traumatic stress disorder, unspecified: Secondary | ICD-10-CM

## 2024-06-04 DIAGNOSIS — F419 Anxiety disorder, unspecified: Secondary | ICD-10-CM

## 2024-06-04 DIAGNOSIS — F902 Attention-deficit hyperactivity disorder, combined type: Secondary | ICD-10-CM | POA: Diagnosis not present

## 2024-06-04 DIAGNOSIS — F3162 Bipolar disorder, current episode mixed, moderate: Secondary | ICD-10-CM

## 2024-06-04 DIAGNOSIS — F633 Trichotillomania: Secondary | ICD-10-CM

## 2024-06-04 NOTE — Progress Notes (Signed)
 THERAPIST PROGRESS NOTE  Virtual Visit via Video Note  I connected with [name] on 06/04/24 at  1:00 PM EDT by a video enabled telemedicine application and verified that I am speaking with the correct person using two identifiers.  Location: Patient: Work [employer] Provider: Providers Address   I discussed the limitations of evaluation and management by telemedicine and the availability of in person appointments. The patient expressed understanding and agreed to proceed.   I discussed the assessment and treatment plan with the patient. The patient was provided an opportunity to ask questions and all were answered. The patient agreed with the plan and demonstrated an understanding of the instructions.   The patient was advised to call back or seek an in-person evaluation if the symptoms worsen or if the condition fails to improve as anticipated.  I provided 59 minutes of non-face-to-face time during this encounter.   [name] Husband, LCSW   Session Time: 1-1:59pm  Participation Level: Active  Behavioral Response: CasualAlertDepressed  Type of Therapy: Individual Therapy  Treatment Goals addressed:  Active     BH CCP Acute or Chronic Trauma Reaction     LTG: Elimination of maladaptive behaviors and thinking patterns which interfere with resolution of trauma as evidenced by self report     Start:  04/22/24    Expected End:  09/22/24         LTG: Develop and implement effective coping skills to carry out normal responsibilities and participate constructively in relationships as evidenced by self report     Start:  04/22/24    Expected End:  09/22/24         LTG: try and unpack reactions and self-preservation from past trauma      Start:  04/22/24    Expected End:  09/22/24         STG: Comer Pagan will identify internal and external stimuli that trigger PTSD symptoms     Start:  04/22/24    Expected End:  09/22/24         STG:  Comer Pagan will verbalize an increased sense of mastery over PTSD symptoms by using several techniques to cope with flashbacks, decrease the power of triggers, and decrease negative thinking     Start:  04/22/24    Expected End:  09/22/24         Cooperate with trauma-focused psychotherapy techniques to reduce emotional reaction to the traumatic event      Start:  04/22/24         Educate Comer Pagan as to the origins of PTSD, common symptoms, and how it impacts those affected by it     Start:  04/22/24         Assess whether Comer Pagan experiences dissociative symptoms (e.g., flashbacks, memory loss, identity disorder), and treat or refer for treatment     Start:  04/22/24         Work with Comer Pagan to construct a list of the situations, people, & places that [name] evoke the most distressing symptoms; suggest that they keep a journal of instances of stress being triggered     Start:  04/22/24         Teach Comer Pagan coping strategies (e.g., writing down thoughts and feelings in a journal; taking deep, slow breaths; calling a support person to talk about memories) to deal with trauma memories and sudden emotional reactions without becoming emo     Start:  04/22/24  ProgressTowards Goals: Progressing  Interventions: CBT, Supportive, and Reframing  Summary: Aunesty Tyson is a 33 y.o. female who presents with hypervigilance, flashbacks, detachment from others, emotional numbing, and nightmares. Per documentation by her psychiatrist, she has a "history of self-harm behaviors, including a suicide attempt in 2020 following her separation from her ex-husband. She has been hospitalized multiple times for psychiatric reasons, with the most recent admission in 2020." Pt was oriented times 5. Pt was cooperative and engaged. Denies HI/AVH.   Reports passive suicidal thoughts without intent or a plan.   The client reports  feeling worse than her usual low mood. She expresses that she feels more numb and is experiencing difficulty with activities of daily living, a lack of motivation, and an overwhelming desire to sleep all day. She also mentions fatigue and struggles with maintaining her hygiene routine. Due to her lack of energy to do her makeup, she has relapsed into trichotillomania. Additionally, she feels overwhelmed and perceives her support system as limited.  The client shares that the thought of appearing in court and possibly seeing her ex-husband causes her significant stress. Together, we reviewed coping skills she has used in the past to manage her lows. She feels angry with herself for losing her sense of identity while seeking validation from others instead of focusing on her own needs.  Patient identified urges to use substances again due to feeling numb, reporting that substance use will aid in her ability to "feel something." Patient reports suicidal ideations but denies intent, plan, or access to means. Patient denied self-harming tendencies or urges at this time. Identified supportive individuals in her life whom she can discuss her urges with and who can serve as her accountability as she explores healthier coping strategies.   She reflected on the components of unhealthy relationships and noted that, as a child, she received messages that she was a failure, which continue to affect her today, contributing to her feelings of burnout. We addressed controllable factors, discussing ways she can give herself time and space to process recent life transitions in the coming days. We closed the session by visiting her peaceful place to help her return to a baseline state.  Suicidal/Homicidal: Yeswithout intent/plan   Therapist Response: Clinician utilized active and supportive reflection to create a safe space for patient to process recent life experiences.  Clinician assessed for current symptoms,  stressors, safety since last session.  Clinician reviewed brief psychoeducation on trauma responses and, trauma symptoms. Explored coping strategies both healthy and unhealthy while identifying her support system. Reflected on ways she can engage in self care in the coming days.   Plan: Return again in 2 weeks.  Diagnosis: Bipolar 1 disorder, mixed, moderate (HCC)  PTSD (post-traumatic stress disorder)  Attention deficit hyperactivity disorder (ADHD), combined type  Trichotillomania  Anxiety disorder, unspecified type    Collaboration of Care: : AEB psychiatrist can access notes and cln. Will review psychiatrists' notes. Check in with the patient and will see LCSW per availability. Patient agreed with treatment recommendations.   Patient/Guardian was advised Release of Information must be obtained prior to any record release in order to collaborate their care with an outside provider. Patient/Guardian was advised if they have not already done so to contact the registration department to sign all necessary forms in order for us  to release information regarding their care.   Consent: Patient/Guardian gives verbal consent for treatment and assignment of benefits for services provided during this visit. Patient/Guardian expressed understanding and agreed  to proceed.   [name] Husband, LCSW 06/04/2024

## 2024-06-18 ENCOUNTER — Ambulatory Visit: Admitting: Licensed Clinical Social Worker

## 2024-06-22 ENCOUNTER — Ambulatory Visit (INDEPENDENT_AMBULATORY_CARE_PROVIDER_SITE_OTHER): Admitting: Licensed Clinical Social Worker

## 2024-06-22 DIAGNOSIS — F3162 Bipolar disorder, current episode mixed, moderate: Secondary | ICD-10-CM | POA: Diagnosis not present

## 2024-06-22 DIAGNOSIS — F633 Trichotillomania: Secondary | ICD-10-CM

## 2024-06-22 DIAGNOSIS — F4310 Post-traumatic stress disorder, unspecified: Secondary | ICD-10-CM | POA: Diagnosis not present

## 2024-06-22 DIAGNOSIS — F419 Anxiety disorder, unspecified: Secondary | ICD-10-CM

## 2024-06-22 NOTE — Progress Notes (Signed)
 THERAPIST PROGRESS NOTE  Virtual Visit via Video Note  I connected with Terri Holt on 06/22/24 at 11:03am by a video enabled telemedicine application and verified that I am speaking with the correct person using two identifiers.  Location: Patient: Work Address Provider: [employer]   I discussed the limitations of evaluation and management by telemedicine and the availability of in person appointments. The patient expressed understanding and agreed to proceed.   I discussed the assessment and treatment plan with the patient. The patient was provided an opportunity to ask questions and all were answered. The patient agreed with the plan and demonstrated an understanding of the instructions.   The patient was advised to call back or seek an in-person evaluation if the symptoms worsen or if the condition fails to improve as anticipated.  I provided 51 minutes of non-face-to-face time during this encounter.   Terri Holt Husband, LCSW   Session Time: 11:03am-11:54am  Participation Level: Active  Behavioral Response: CasualAlertDepressed  Type of Therapy: Individual Therapy  Treatment Goals addressed:  Active     BH CCP Acute or Chronic Trauma Reaction     LTG: Elimination of maladaptive behaviors and thinking patterns which interfere with resolution of trauma as evidenced by self report     Start:  04/22/24    Expected End:  09/22/24         LTG: Develop and implement effective coping skills to carry out normal responsibilities and participate constructively in relationships as evidenced by self report     Start:  04/22/24    Expected End:  09/22/24         LTG: try and unpack reactions and self-preservation from past trauma      Start:  04/22/24    Expected End:  09/22/24         STG: Terri Pagan will identify internal and external stimuli that trigger PTSD symptoms     Start:  04/22/24    Expected End:  09/22/24         STG: Terri Pagan will  verbalize an increased sense of mastery over PTSD symptoms by using several techniques to cope with flashbacks, decrease the power of triggers, and decrease negative thinking     Start:  04/22/24    Expected End:  09/22/24         Cooperate with trauma-focused psychotherapy techniques to reduce emotional reaction to the traumatic event      Start:  04/22/24         Educate Terri Pagan as to the origins of PTSD, common symptoms, and how it impacts those affected by it     Start:  04/22/24         Assess whether Terri Pagan experiences dissociative symptoms (e.g., flashbacks, memory loss, identity disorder), and treat or refer for treatment     Start:  04/22/24         Work with Terri Pagan to construct a list of the situations, people, & places that [name] evoke the most distressing symptoms; suggest that they keep a journal of instances of stress being triggered     Start:  04/22/24         Teach Terri Pagan coping strategies (e.g., writing down thoughts and feelings in a journal; taking deep, slow breaths; calling a support person to talk about memories) to deal with trauma memories and sudden emotional reactions without becoming emo     Start:  04/22/24  ProgressTowards Goals: Progressing  Interventions: CBT, Assertiveness Training, Supportive, Reframing, and Other:    Summary: Terri Holt is a 33 y.o. female who presents with hypervigilance, flashbacks, detachment from others, emotional numbing, and nightmares. Per documentation by her psychiatrist, she has a "history of self-harm behaviors, including a suicide attempt in 2020 following her separation from her ex-husband. She has been hospitalized multiple times for psychiatric reasons, with the most recent admission in 2020." Pt was oriented times 5. Pt was cooperative and engaged. Denies HI/AVH. Reports passive suicidal thoughts without intent or a plan.    The  patient utilized the therapeutic space to process a recent family visit where she had childcare responsibilities, and she was able to engage in self-care. She shared about a recent court hearing and reflected on her fear of seeing her ex-husband, expressing feelings of anxiety. The clinician worked with the patient on reframing negative messages she overheard from her ex-spouse and explored her feelings of powerlessness regarding the treatment she received from her husband.   Parts therapy was used to help the patient identify the version of herself that emerges when exposed to triggers. The session explored how the patient punishes herself in a way similar to her experiences within her relationship. We processed feelings of regret, and through Socratic questioning, the patient began to realize the connection between her previous spouse's behaviors and those of her parents. She reflected on the messages she received in childhood and how these influenced her engagement in unhealthy relationships in an attempt to seek different outcomes from her early experiences. The patient became tearful as she reflected on this realization.   The clinician then worked with the patient on reframing by identifying controllable factors and discussing ways she can begin to regain control of her life.  Suicidal/Homicidal: Yeswithout intent/plan  Therapist Response: Clinician utilized active and supportive reflection to create a safe space for patient to process recent life experiences.  Clinician assessed for current symptoms, stressors, safety since last session.  Clinician reviewed brief psychoeducation on trauma responses and, trauma symptoms. Explored connections between trauma experiences and unhealthy messages received in childhood.  Reflected on ways in which patient can begin to acknowledge unhealthy behaviors within relationships as she works towards feeling comfortable establishing healthy boundaries.  Clinician  challenged patient through CBT to reframe misplaced guilt and shame.  Reflected on ways in which patient can give herself grace as she takes today's understanding applying it to previous situations.  Parts therapy was used to help patient understand this concept as well as efforts to identify controllable factors.  Plan: Return again in 2 weeks.  Diagnosis: Bipolar 1 disorder, mixed, moderate (HCC)  PTSD (post-traumatic stress disorder)  Trichotillomania  Anxiety disorder, unspecified type   Collaboration of Care: AEB psychiatrist can access notes and cln. Will review psychiatrists' notes. Check in with the patient and will see LCSW per availability. Patient agreed with treatment recommendations.   Patient/Guardian was advised Release of Information must be obtained prior to any record release in order to collaborate their care with an outside provider. Patient/Guardian was advised if they have not already done so to contact the registration department to sign all necessary forms in order for us  to release information regarding their care.   Consent: Patient/Guardian gives verbal consent for treatment and assignment of benefits for services provided during this visit. Patient/Guardian expressed understanding and agreed to proceed.   Terri Holt Husband, LCSW 06/22/2024

## 2024-07-01 ENCOUNTER — Encounter: Payer: Self-pay | Admitting: Licensed Clinical Social Worker

## 2024-07-01 ENCOUNTER — Ambulatory Visit (INDEPENDENT_AMBULATORY_CARE_PROVIDER_SITE_OTHER): Admitting: Licensed Clinical Social Worker

## 2024-07-01 DIAGNOSIS — F633 Trichotillomania: Secondary | ICD-10-CM

## 2024-07-01 DIAGNOSIS — F3162 Bipolar disorder, current episode mixed, moderate: Secondary | ICD-10-CM

## 2024-07-01 DIAGNOSIS — F419 Anxiety disorder, unspecified: Secondary | ICD-10-CM | POA: Diagnosis not present

## 2024-07-01 DIAGNOSIS — F902 Attention-deficit hyperactivity disorder, combined type: Secondary | ICD-10-CM

## 2024-07-01 DIAGNOSIS — F4310 Post-traumatic stress disorder, unspecified: Secondary | ICD-10-CM

## 2024-07-01 DIAGNOSIS — Z87898 Personal history of other specified conditions: Secondary | ICD-10-CM

## 2024-07-01 NOTE — Progress Notes (Signed)
 THERAPIST PROGRESS NOTE  Virtual Visit via Video Note  I connected with Terri Holt on 07/01/24 at 11:00 AM EDT by a video enabled telemedicine application and verified that I am speaking with the correct person using two identifiers.  Location: Patient: Work parking Engineer, maintenance: [employer]   I discussed the limitations of evaluation and management by telemedicine and the availability of in person appointments. The patient expressed understanding and agreed to proceed.  I discussed the assessment and treatment plan with the patient. The patient was provided an opportunity to ask questions and all were answered. The patient agreed with the plan and demonstrated an understanding of the instructions.   The patient was advised to call back or seek an in-person evaluation if the symptoms worsen or if the condition fails to improve as anticipated.  I provided 52 minutes of non-face-to-face time during this encounter.   Terri Holt Husband, LCSW   Session Time: 11-11:52  Participation Level: Active  Behavioral Response: CasualDrowsyDepressed and Flat  Type of Therapy: Individual Therapy  Treatment Goals addressed:  Active     BH CCP Acute or Chronic Trauma Reaction     LTG: Elimination of maladaptive behaviors and thinking patterns which interfere with resolution of trauma as evidenced by self report     Start:  04/22/24    Expected End:  09/22/24         LTG: Develop and implement effective coping skills to carry out normal responsibilities and participate constructively in relationships as evidenced by self report     Start:  04/22/24    Expected End:  09/22/24         LTG: try and unpack reactions and self-preservation from past trauma      Start:  04/22/24    Expected End:  09/22/24         STG: Terri Pagan will identify internal and external stimuli that trigger PTSD symptoms     Start:  04/22/24    Expected End:  09/22/24         STG: Terri Pagan will verbalize an increased sense of mastery over PTSD symptoms by using several techniques to cope with flashbacks, decrease the power of triggers, and decrease negative thinking     Start:  04/22/24    Expected End:  09/22/24         Cooperate with trauma-focused psychotherapy techniques to reduce emotional reaction to the traumatic event      Start:  04/22/24         Educate Terri Pagan as to the origins of PTSD, common symptoms, and how it impacts those affected by it     Start:  04/22/24         Assess whether Terri Pagan experiences dissociative symptoms (e.g., flashbacks, memory loss, identity disorder), and treat or refer for treatment     Start:  04/22/24         Work with Terri Pagan to construct a list of the situations, people, & places that [name] evoke the most distressing symptoms; suggest that they keep a journal of instances of stress being triggered     Start:  04/22/24         Teach Terri Pagan coping strategies (e.g., writing down thoughts and feelings in a journal; taking deep, slow breaths; calling a support person to talk about memories) to deal with trauma memories and sudden emotional reactions without becoming emo     Start:  04/22/24  ProgressTowards Goals: Variable  Interventions: Solution Focused and Supportive  Summary: Terri Holt is a 33 y.o. female who presents with hypervigilance, flashbacks, detachment from others, emotional numbing, and nightmares. Per documentation by her psychiatrist, she has a "history of self-harm behaviors, including a suicide attempt in 2020 following her separation from her ex-husband. She has been hospitalized multiple times for psychiatric reasons, with the most recent admission in 2020." Pt was oriented times 5. Pt was cooperative and engaged. Denies HI/AVH. Reports passive suicidal thoughts without intent or a plan.   Patient's reports indicate that  the patient is experiencing worsening symptoms, stating, I can't sleep, and I have a hard time eating. She mentions difficulties concentrating and expresses feelings of apathy, saying, I just don't care about anything. The patient believes her medication is not helping and denies any suicidal ideation, plan, or intent.  The clinician introduced the idea of Partial Hospitalization Program (PHP), but the patient responded, Being in groups makes me shut down. She expressed a desire for a more aggressive approach regarding her medications.  The patient reflected on a recent manic episode during which she went days without sleep, completed numerous tasks, and then shut back down. She also expressed a desire to revert to previous unhealthy coping mechanisms, stating, I drank to feel something. The clinician assessed her for alternative coping strategies.  The patient reports feeling out of control and reflected on factors she can control. They discussed ways she can manage her current tasks. The clinician provided psychoeducation on dissociation and encouraged the patient to engage in specific coping skills when she feels numb and disconnected. The clinician also emphasized the importance of prioritizing her basic needs for survival, including exploring ways to obtain nutrients when she isn't hungry, getting restful sleep, and reminding her to seek help during this difficult time.  The clinician reached out to administration to place the patient on a waitlist for earlier medication management. The patient was also added to the waitlist for therapy.  Additionally, the clinician encouraged the patient to speak with her HR representative about exploring FMLA and short-term disability options. The patient indicated she planned to consult HR regarding these options after her session.  Regarding safety, the patient was encouraged to go to the Emergency Department or [facility])  if she needs assistance before her upcoming appointment, and she agreed to this. Coping skills and [facility] information were shared with her via MyChart.  Suicidal/Homicidal: Nowithout intent/plan  Therapist Response: Clinician utilized active and supportive reflection to create a safe space for patient to process recent life experiences. Clinician assessed for current symptoms, stressors, safety since last session.  Clinician worked with patient to explore coping mechanisms as well as reviewed safety protocol.  Clinician encouraged patient to identify controllable factors especially regarding caring for her basic needs during this time.  Clinician also encouraged patient to advocate for her mental health with human resources through work exploring ways in which she could possibly obtain time off during this time.  Plan: Return again in 2 weeks.  Diagnosis: Bipolar 1 disorder, mixed, moderate (HCC)  PTSD (post-traumatic stress disorder)  Trichotillomania  Anxiety disorder, unspecified type  Attention deficit hyperactivity disorder (ADHD), combined type  History of substance use disorder   Collaboration of Care: AEB psychiatrist can access notes and cln. Will review psychiatrists' notes. Check in with the patient and will see LCSW per availability. Patient agreed with treatment recommendations.   Patient/Guardian was advised Release of Information must be  obtained prior to any record release in order to collaborate their care with an outside provider. Patient/Guardian was advised if they have not already done so to contact the registration department to sign all necessary forms in order for us  to release information regarding their care.   Consent: Patient/Guardian gives verbal consent for treatment and assignment of benefits for services provided during this visit. Patient/Guardian expressed understanding and agreed to proceed.   Terri Holt Husband, LCSW 07/01/2024

## 2024-07-21 ENCOUNTER — Ambulatory Visit (INDEPENDENT_AMBULATORY_CARE_PROVIDER_SITE_OTHER): Admitting: Licensed Clinical Social Worker

## 2024-07-21 ENCOUNTER — Encounter: Payer: Self-pay | Admitting: Licensed Clinical Social Worker

## 2024-07-21 DIAGNOSIS — F902 Attention-deficit hyperactivity disorder, combined type: Secondary | ICD-10-CM

## 2024-07-21 DIAGNOSIS — F4310 Post-traumatic stress disorder, unspecified: Secondary | ICD-10-CM | POA: Diagnosis not present

## 2024-07-21 DIAGNOSIS — F419 Anxiety disorder, unspecified: Secondary | ICD-10-CM

## 2024-07-21 DIAGNOSIS — Z87898 Personal history of other specified conditions: Secondary | ICD-10-CM

## 2024-07-21 DIAGNOSIS — F633 Trichotillomania: Secondary | ICD-10-CM

## 2024-07-21 DIAGNOSIS — F3162 Bipolar disorder, current episode mixed, moderate: Secondary | ICD-10-CM

## 2024-07-21 NOTE — Progress Notes (Signed)
 THERAPIST PROGRESS NOTE  Virtual Visit via Video Note  I connected with Terri Holt on 07/21/24 at 11:00 AM EDT by a video enabled telemedicine application and verified that I am speaking with the correct person using two identifiers.  Location: Patient: Work Address Provider: [employer]   I discussed the limitations of evaluation and management by telemedicine and the availability of in person appointments. The patient expressed understanding and agreed to proceed.   I discussed the assessment and treatment plan with the patient. The patient was provided an opportunity to ask questions and all were answered. The patient agreed with the plan and demonstrated an understanding of the instructions.   The patient was advised to call back or seek an in-person evaluation if the symptoms worsen or if the condition fails to improve as anticipated.  I provided 64 minutes of non-face-to-face time during this encounter.   Terri Holt Husband, LCSW   Session Time: 11-12:04pm  Participation Level: Active  Behavioral Response: CasualAlertDepressed  Type of Therapy: Individual Therapy  Treatment Goals addressed:  Active     BH CCP Acute or Chronic Trauma Reaction     LTG: Elimination of maladaptive behaviors and thinking patterns which interfere with resolution of trauma as evidenced by self report     Start:  04/22/24    Expected End:  09/22/24         LTG: Develop and implement effective coping skills to carry out normal responsibilities and participate constructively in relationships as evidenced by self report     Start:  04/22/24    Expected End:  09/22/24         LTG: try and unpack reactions and self-preservation from past trauma      Start:  04/22/24    Expected End:  09/22/24         STG: Terri Holt will identify internal and external stimuli that trigger PTSD symptoms     Start:  04/22/24    Expected End:  09/22/24         STG: Terri Holt will  verbalize an increased sense of mastery over PTSD symptoms by using several techniques to cope with flashbacks, decrease the power of triggers, and decrease negative thinking     Start:  04/22/24    Expected End:  09/22/24         Cooperate with trauma-focused psychotherapy techniques to reduce emotional reaction to the traumatic event      Start:  04/22/24         Educate Terri Holt as to the origins of PTSD, common symptoms, and how it impacts those affected by it     Start:  04/22/24         Assess whether Terri Holt experiences dissociative symptoms (e.g., flashbacks, memory loss, identity disorder), and treat or refer for treatment     Start:  04/22/24         Work with Terri Holt to construct a list of the situations, people, & places that Terri Holt evoke the most distressing symptoms; suggest that they keep a journal of instances of stress being triggered     Start:  04/22/24         Teach Terri Holt coping strategies (e.g., writing down thoughts and feelings in a journal; taking deep, slow breaths; calling a support person to talk about memories) to deal with trauma memories and sudden emotional reactions without becoming emo     Start:  04/22/24  ProgressTowards Goals: Progressing  Interventions: Assertiveness Training, Supportive, and Other: IFS  Summary: Terri Holt is a 33 y.o. female who presents with hypervigilance, flashbacks, detachment from others, emotional numbing, and nightmares. Per documentation by her psychiatrist, she has a "history of self-harm behaviors, including a suicide attempt in 2020 following her separation from her ex-Holt. She has been hospitalized multiple times for psychiatric reasons, with the most recent admission in 2020." Pt was oriented times 5. Pt was cooperative and engaged. Denies HI/AVH. Reports passive suicidal thoughts without intent or a plan.    It has been observed and  patient shares observations that she is increasingly withdrawing from her social circles, often appearing numb and fatigued. This detachment seems to stem from several sources, particularly financial stressors that have led to feelings of anger and frustration. She has taken the time to reflect on her unhealthy relationships, recognizing the importance of establishing boundaries for her well-being.  Currently, she finds herself struggling with the idea of asking for help, fearing the potential negative consequences that could arise. Cln observed the patinet express anger and frusttraion with her support system identifying feeling relationships are transactional or that others might judge her for needing support.  Her goal is to have an open and honest conversation with her roommate this Friday, which she hopes will help clarify her feelings and foster understanding. Amidst all this turmoil, she is grappling with her self-worth and the concept of self-love, often feeling as though she is punishing herself for her struggles. Cln challenged the patient to understand that hating herself, as she describes, is not helping her heal from the past. Cln briefly applied Internal Family Systems (IFS) therapy to help the patient recognize that she has three distinct parts: an isolative part, a part that desires change and improved self-worth, and a part stemming from trauma (Engineer, site).   Suicidal/Homicidal: Nowithout intent/plan  Therapist Response: Clinician utilized active and supportive reflection to create a safe space for patient to process recent life experiences. Clinician assessed for current symptoms, stressors, safety since last session.  Provided psychoeducation via internal family systems therapy to help patient understand her parts and ways in which she can began to be aware of which parts are contributing to certain behaviors.  Clinician continue to challenge patient and ways in which she can establish  healthier boundaries in an effort to begin to create a healthier connection with herself.  Plan: Return again in 2 weeks.  Diagnosis: Bipolar 1 disorder, mixed, moderate (HCC)  PTSD (post-traumatic stress disorder)  Trichotillomania  Anxiety disorder, unspecified type  Attention deficit hyperactivity disorder (ADHD), combined type  History of substance use disorder]  Collaboration of Care: AEB psychiatrist can access notes and cln. Will review psychiatrists' notes. Check in with the patient and will see LCSW per availability. Patient agreed with treatment recommendations.   Patient/Guardian was advised Release of Information must be obtained prior to any record release in order to collaborate their care with an outside provider. Patient/Guardian was advised if they have not already done so to contact the registration department to sign all necessary forms in order for us  to release information regarding their care.   Consent: Patient/Guardian gives verbal consent for treatment and assignment of benefits for services provided during this visit. Patient/Guardian expressed understanding and agreed to proceed.   Terri Holt Husband, LCSW 07/21/2024

## 2024-07-28 ENCOUNTER — Ambulatory Visit (INDEPENDENT_AMBULATORY_CARE_PROVIDER_SITE_OTHER): Admitting: Psychiatry

## 2024-07-28 ENCOUNTER — Encounter: Payer: Self-pay | Admitting: Psychiatry

## 2024-07-28 ENCOUNTER — Other Ambulatory Visit: Payer: Self-pay

## 2024-07-28 VITALS — BP 124/87 | HR 84 | Temp 97.3°F | Ht 65.0 in | Wt 139.4 lb

## 2024-07-28 DIAGNOSIS — F4310 Post-traumatic stress disorder, unspecified: Secondary | ICD-10-CM

## 2024-07-28 DIAGNOSIS — F3162 Bipolar disorder, current episode mixed, moderate: Secondary | ICD-10-CM

## 2024-07-28 DIAGNOSIS — F633 Trichotillomania: Secondary | ICD-10-CM | POA: Diagnosis not present

## 2024-07-28 DIAGNOSIS — F902 Attention-deficit hyperactivity disorder, combined type: Secondary | ICD-10-CM | POA: Diagnosis not present

## 2024-07-28 DIAGNOSIS — Z87898 Personal history of other specified conditions: Secondary | ICD-10-CM

## 2024-07-28 MED ORDER — LAMOTRIGINE 150 MG PO TABS: 150.0000 mg | ORAL_TABLET | Freq: Every day | ORAL | 1 refills | Status: DC

## 2024-07-28 MED ORDER — LISDEXAMFETAMINE DIMESYLATE 40 MG PO CAPS: 40.0000 mg | ORAL_CAPSULE | ORAL | 0 refills | Status: DC

## 2024-07-28 NOTE — Progress Notes (Signed)
 BH MD OP Progress Note  07/28/2024 5:32 PM Terri Holt  MRN:  [id number]  Chief Complaint:  Chief Complaint  Patient presents with   Follow-up   Depression   Medication Refill   Anxiety   Discussed the use of AI scribe software for clinical note transcription with the patient, who gave verbal consent to proceed.  History of Present Illness Terri Holt is a 33 year old Caucasian female, currently employed, single, lives in [place], has a history of bipolar disorder type I, ADHD, PTSD, trichotillomania, anxiety disorder unspecified, polysubstance abuse in remission was evaluated in office today for a follow-up appointment.  Significant difficulty with sleep affects her, as she reports being unable to rest even when she has the opportunity. She links her insomnia to an inability to complete daily tasks during the day, which leads her to do homework and other responsibilities at night. Even when she lies down, she cannot fall asleep and often finds herself staring at her homework, unable to start. She describes her sleep as fragmented and notes that when she takes hydroxyzine  for sleep, she can fall asleep but remains fully conscious of her surroundings. She takes hydroxyzine  only when her children are not present, as she feels safer being more alert when they are home. She notes that her children are with their father every other weekend, and during those times, she experiences increased stress related to their absence.  Ongoing symptoms of posttraumatic stress, including flashbacks, nightmares, intrusive memories, and hypervigilance, have worsened over the past couple of weeks. She describes persistent feelings of guilt related to her role as a mother, particularly when she tries to focus on her homework instead of spending time with her children. She identifies stress related to balancing school, work, and parenting responsibilities, and notes that her children's father is  not supportive, which adds to her stress.  Difficulty with focus and attention continues, as she states she cannot shut her brain off and struggles to start and complete tasks such as homework. She reports currently taking Vyvanse  40 mg, usually between 8:00 and 8:30 am, but does not take it every day and sometimes forgets to take it. She also reports taking Lamictal , currently at 125 mg.  Denies side effects.  She reports ongoing trichotillomania, specifically pulling out her real eyelashes, and uses fake eyelashes to help increase her awareness and reduce the behavior. She also uses a ribbon to fidget, which she finds helpful as a coping mechanism.  She denies any current thoughts of hurting herself or others.  She reports decreased appetite, stating that she is a big eater but currently does not feel hungry due to stress. She makes herself eat and tries to drink protein shakes when not hungry. She has started taking a different multivitamin, which she feels is helping with her energy levels.  She reported prior engagement in cognitive behavioral therapy (CBT) and considered EMDR but did not proceed with it.    Visit Diagnosis:    ICD-10-CM   1. Bipolar 1 disorder, mixed, moderate (HCC)  F31.62 lamoTRIgine  (LAMICTAL ) 150 MG tablet    2. PTSD (post-traumatic stress disorder)  F43.10 lamoTRIgine  (LAMICTAL ) 150 MG tablet    3. Attention deficit hyperactivity disorder (ADHD), combined type  F90.2 lisdexamfetamine  (VYVANSE ) 40 MG capsule    4. 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 History of substance use disorder  Z87.898    Cocaine, Xanax and heroin in remission      Past Psychiatric History: I have  reviewed past psychiatric history from progress note on 02/01/2024.  I have reviewed past psychiatric history from progress note on 04/21/2024.  Past Medical History:  Past Medical History:  Diagnosis Date   Anxiety    Bipolar I disorder, most recent episode depressed, severe without psychotic  features (HCC)    Constipation    Depression    Headache    Migraines    Mini stroke    Schizophrenia [facility])     Past Surgical History:  Procedure Laterality Date   CESAREAN SECTION     CESAREAN SECTION N/A 02/26/2018   Procedure: REPEAT CESAREAN SECTION;  Surgeon: Connell Davies, MD;  Location: [facility] ORS;  Service: Obstetrics;  Laterality: N/A;   COLONOSCOPY WITH PROPOFOL  N/A 05/13/2023   Procedure: COLONOSCOPY WITH PROPOFOL ;  Surgeon: Unk Corinn Skiff, MD;  Location: [facility] ENDOSCOPY;  Service: Gastroenterology;  Laterality: N/A;   WISDOM TOOTH EXTRACTION      Family Psychiatric History: I reviewed family psychiatric history from this note on 02/01/2024.  Family History:  Family History  Problem Relation Age of Onset   Depression Mother    Migraines Mother    ADD / ADHD Father    Depression Father    Asthma Brother    Hyperlipidemia Brother    ADD / ADHD Maternal Aunt    Thyroid  disease Maternal Aunt    Cancer Maternal Aunt        melanoma   Heart failure Maternal Grandfather    Hyperlipidemia Maternal Grandfather    Asthma Maternal Grandmother    Hyperlipidemia Maternal Grandmother    Bipolar disorder Paternal Grandfather    Suicidality Paternal Grandfather    Heart failure Paternal Grandmother    Cancer Paternal Grandmother        tumor   ADD / ADHD Cousin    ADD / ADHD Son    Asthma Son     Social History: I have reviewed social history from progress note on 02/01/2024. Social History   Socioeconomic History   Marital status: Divorced    Spouse name: Not on file   Number of children: Not on file   Years of education: Not on file   Highest education level: Associate degree: occupational, scientist, product/process development, or vocational program  Occupational History   Not on file  Tobacco Use   Smoking status: Former    Current packs/day: 0.00    Average packs/day: 0.5 packs/day for 4.0 years (2.0 ttl pk-yrs)    Types: Cigarettes    Start date: 06/26/2013    Quit date: 06/26/2017     Years since quitting: 7.0   Smokeless tobacco: Never  Vaping Use   Vaping status: Some Days  Substance and Sexual Activity   Alcohol use: Not Currently    Alcohol/week: 16.0 standard drinks of alcohol    Types: 16 Glasses of wine per week    Comment: wine at night, occasionally   Drug use: Not Currently    Types: Amphetamines   Sexual activity: Yes    Partners: Male    Birth control/protection: None, Surgical    Comment: Tubal Ligation  Other Topics Concern   Not on file  Social History Narrative   ** Merged History Encounter **       Social Drivers of Health   Financial Resource Strain: Low Risk  (05/06/2024)   Overall Financial Resource Strain (CARDIA)    Difficulty of Paying Living Expenses: Not hard at all  Food Insecurity: No Food Insecurity (05/06/2024)   Hunger Vital Sign  Worried About Programme Researcher, Broadcasting/film/video in the Last Year: Never true    Ran Out of Food in the Last Year: Never true  Transportation Needs: No Transportation Needs (05/06/2024)   PRAPARE - Administrator, Civil Service (Medical): No    Lack of Transportation (Non-Medical): No  Physical Activity: Sufficiently Active (03/20/2023)   Exercise Vital Sign    Days of Exercise per Week: 5 days    Minutes of Exercise per Session: 30 min  Stress: Stress Concern Present (05/06/2024)   [employer] of Occupational Health - Occupational Stress Questionnaire    Feeling of Stress : Very much  Social Connections: Socially Isolated (03/20/2023)   Social Connection and Isolation Panel    Frequency of Communication with Friends and Family: More than three times a week    Frequency of Social Gatherings with Friends and Family: Twice a week    Attends Religious Services: Never    Database Administrator or Organizations: No    Attends Engineer, Structural: Not on file    Marital Status: Divorced    Allergies:  Allergies  Allergen Reactions   Amoxicillin Hives and Rash   Penicillins Hives and  Rash    Has patient had a PCN reaction causing immediate rash, facial/tongue/throat swelling, SOB or lightheadedness with hypotension: Yes Has patient had a PCN reaction causing severe rash involving mucus membranes or skin necrosis: Yes Has patient had a PCN reaction that required hospitalization: No Has patient had a PCN reaction occurring within the last 10 years: Yes If all of the above answers are NO, then may proceed with Cephalosporin use.    Adhesive [Tape]     Pull skin off.  Both paper tape and tegaderm are ok   Latex Rash   Nickel Hives and Rash    Metabolic Disorder Labs: Lab Results  Component Value Date   HGBA1C 4.7 (L) 01/31/2019   MPG 88.19 01/31/2019   No results found for: PROLACTIN Lab Results  Component Value Date   CHOL 120 01/31/2019   TRIG 147 01/31/2019   HDL 50 01/31/2019   CHOLHDL 2.4 01/31/2019   VLDL 29 01/31/2019   LDLCALC 41 01/31/2019   Lab Results  Component Value Date   TSH 2.789 02/17/2024   TSH 1.540 10/04/2022    Therapeutic Level Labs: No results found for: LITHIUM No results found for: VALPROATE Lab Results  Component Value Date   CBMZ 3.4 (L) 06/11/2019    Current Medications: Current Outpatient Medications  Medication Sig Dispense Refill   lamoTRIgine  (LAMICTAL ) 150 MG tablet Take 1 tablet (150 mg total) by mouth daily. Dose increase 30 tablet 1   diclofenac (VOLTAREN) 75 MG EC tablet Take 75 mg twice daily for 1 month, then decrease to 75 mg once daily for 1 month, then stop taking.     hydrOXYzine  (VISTARIL ) 50 MG capsule Take 1 capsule (50 mg total) by mouth 3 (three) times daily as needed for anxiety. 90 capsule 1   lisdexamfetamine  (VYVANSE ) 40 MG capsule Take 1 capsule (40 mg total) by mouth every morning. 30 capsule 0   Multiple Vitamin (MULTI-VITAMIN) tablet Take 1 tablet by mouth daily.     ondansetron  (ZOFRAN -ODT) 4 MG disintegrating tablet Take 1 tablet (4 mg total) by mouth every 8 (eight) hours as needed  for nausea or vomiting. 20 tablet 0   SUMAtriptan  (IMITREX ) 25 MG tablet Take 1 tablet (25 mg total) by mouth every 2 (two) hours as needed  for migraine. May repeat in 2 hours if headache persists or recurs. 10 tablet 0   traZODone  (DESYREL ) 50 MG tablet Take 0.5-1 tablets (25-50 mg total) by mouth at bedtime as needed for sleep. 30 tablet 1   Vitamin D , Ergocalciferol , (DRISDOL ) 1.25 MG (50000 UNIT) CAPS capsule Take 1 capsule (50,000 Units total) by mouth every 7 (seven) days. 26 capsule 1   No current facility-administered medications for this visit.     Musculoskeletal: Strength & Muscle Tone: within normal limits Gait & Station: normal Patient leans: N/A  Psychiatric Specialty Exam: Review of Systems  Psychiatric/Behavioral:  Positive for decreased concentration, dysphoric mood and sleep disturbance. The patient is nervous/anxious.     Blood pressure 124/87, pulse 84, temperature (!) 97.3 F (36.3 C), temperature source Temporal, height 5' 5 (1.651 m), weight 139 lb 6.4 oz (63.2 kg).Body mass index is 23.2 kg/m.  General Appearance: Fairly Groomed  Eye Contact:  Fair  Speech:  Clear and Coherent  Volume:  Normal  Mood:  Anxious and Depressed  Affect:  Depressed  Thought Process:  Goal Directed and Descriptions of Associations: Intact  Orientation:  Full (Time, Place, and Person)  Thought Content: Logical   Suicidal Thoughts:  No  Homicidal Thoughts:  No  Memory:  Immediate;   Fair Recent;   Fair Remote;   Fair  Judgement:  Fair  Insight:  Fair  Psychomotor Activity:  Normal  Concentration:  Concentration: Limited and Attention Span: Limited  Recall:  [employer] of Knowledge: Fair  Language: Fair  Akathisia:  No  Handed:  Right  AIMS (if indicated): not done  Assets:  Communication Skills Desire for Improvement Housing Talents/Skills Transportation  ADL's:  Intact  Cognition: WNL  Sleep:  Poor   Screenings: AIMS    Flowsheet Row Admission (Discharged)  from 01/31/2019 in [facility]  AIMS Total Score 0   AUDIT    Flowsheet Row Admission (Discharged) from 01/31/2019 in [facility] INPATIENT BEHAVIORAL MEDICINE  Alcohol Use Disorder Identification Test Final Score (AUDIT) 0   GAD-7    Flowsheet Row Office Visit from 05/06/2024 in [facility] Counselor from 04/22/2024 in [facility] Office Visit from 03/05/2024 in [facility] Office Visit from 01/17/2024 in [facility] Office Visit from 04/17/2023 in [facility]  Total GAD-7 Score 16 19 15 21 21    PHQ2-9    Flowsheet Row Office Visit from 05/06/2024 in [facility] Counselor from 04/22/2024 in [facility] Office Visit from 03/05/2024 in [facility] Office Visit from 01/17/2024 in [facility] Office Visit from 04/17/2023 in [facility]  PHQ-2 Total Score 6 6 6 6 6   PHQ-9 Total Score 24 26 22 24 24    Flowsheet Row Office Visit from 07/28/2024 in [facility] Video Visit from 06/02/2024 in [facility] ED from 05/08/2024 in [facility] at [facility]  C-SSRS RISK CATEGORY Moderate Risk Moderate Risk Moderate Risk     Assessment and Plan: Norma Montemurro is a 34 year old Caucasian female who has a history of bipolar disorder, PTSD, ADHD, anxiety disorder was evaluated in office today.  Discussed assessment and plan as noted below.  Bipolar disorder type I moderate-unstable Currently continues to struggle with mood swings, irritability depression, anxiety symptoms  mostly because of situational stresses, shift at work as well as being at school and not having enough  support with young children at home which does affect her ability to take her sleep medication at night and that does affect her sleep contributing to a lot of her issues. Increase Lamotrigine  150 mg daily divided dosage Continue psychotherapy sessions with Ms. Perkins.  I have coordinated care.  Posttraumatic stress disorder-unstable Continues to struggle with trauma related symptoms and sleep issues. Encouraged to take Trazodone  half to 1 tablet of 50 mg at bedtime more frequently. Continue Hydroxyzine  50 mg  as needed for sleep and anxiety Encouraged to continue CBT with Ms. Perkins.  Will benefit from trauma focused therapy.  Trichotillomania-improving Continues to struggle although with improvement. Encouraged to continue CBT. Continue Hydroxyzine  50 mg  as needed  ADHD-unstable Current attention and focus problems likely exacerbated by sleep issues and being a single mother with young children as well as being in school and work and struggling with time management. We will consider increasing the dosage of Vyvanse  in the future however she is not interested at this time due to concerns about sleep problems. Continue Vyvanse  40 mg daily Reviewed [place] PMP AWARxE  Substance use disorder in remission Currently denies any use Will consider repeating urine drug screen in the future  Follow-up Follow-up will clinic in 1 month or sooner if needed.   Collaboration of Care: Collaboration of Care: Referral or follow-up with counselor/therapist AEB encouraged to continue psychotherapy sessions I have coordinated care with Ms. Perkins.  Patient/Guardian was advised Release of Information must be obtained prior to any record release in order to collaborate their care with an outside provider. Patient/Guardian was advised if they have not already done so to contact the registration department to sign all necessary forms in order for us  to release information regarding their care.   Consent:  Patient/Guardian gives verbal consent for treatment and assignment of benefits for services provided during this visit. Patient/Guardian expressed understanding and agreed to proceed.  This note was generated in part or whole with voice recognition software. Voice recognition is usually quite accurate but there are transcription errors that can and very often do occur. I apologize for any typographical errors that were not detected and corrected.     Adna Nofziger, MD 07/30/2024, 8:28 AM

## 2024-08-05 ENCOUNTER — Ambulatory Visit (INDEPENDENT_AMBULATORY_CARE_PROVIDER_SITE_OTHER): Admitting: Licensed Clinical Social Worker

## 2024-08-05 DIAGNOSIS — F902 Attention-deficit hyperactivity disorder, combined type: Secondary | ICD-10-CM | POA: Diagnosis not present

## 2024-08-05 DIAGNOSIS — F4310 Post-traumatic stress disorder, unspecified: Secondary | ICD-10-CM

## 2024-08-05 DIAGNOSIS — F419 Anxiety disorder, unspecified: Secondary | ICD-10-CM | POA: Diagnosis not present

## 2024-08-05 DIAGNOSIS — Z87898 Personal history of other specified conditions: Secondary | ICD-10-CM

## 2024-08-05 DIAGNOSIS — F3162 Bipolar disorder, current episode mixed, moderate: Secondary | ICD-10-CM | POA: Diagnosis not present

## 2024-08-05 DIAGNOSIS — F633 Trichotillomania: Secondary | ICD-10-CM
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
## 2024-08-17 ENCOUNTER — Ambulatory Visit (INDEPENDENT_AMBULATORY_CARE_PROVIDER_SITE_OTHER): Admitting: Licensed Clinical Social Worker

## 2024-08-17 DIAGNOSIS — F633 Trichotillomania: Secondary | ICD-10-CM

## 2024-08-17 DIAGNOSIS — F902 Attention-deficit hyperactivity disorder, combined type: Secondary | ICD-10-CM | POA: Diagnosis not present

## 2024-08-17 DIAGNOSIS — F4310 Post-traumatic stress disorder, unspecified: Secondary | ICD-10-CM

## 2024-08-17 DIAGNOSIS — Z87898 Personal history of other specified conditions: Secondary | ICD-10-CM

## 2024-08-17 DIAGNOSIS — F3162 Bipolar disorder, current episode mixed, moderate: Secondary | ICD-10-CM | POA: Diagnosis not present

## 2024-08-17 DIAGNOSIS — F419 Anxiety disorder, unspecified: Secondary | ICD-10-CM | POA: Diagnosis not present
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
## 2024-08-31 ENCOUNTER — Ambulatory Visit (INDEPENDENT_AMBULATORY_CARE_PROVIDER_SITE_OTHER): Admitting: Licensed Clinical Social Worker

## 2024-08-31 DIAGNOSIS — F4310 Post-traumatic stress disorder, unspecified: Secondary | ICD-10-CM

## 2024-08-31 DIAGNOSIS — Z87898 Personal history of other specified conditions: Secondary | ICD-10-CM

## 2024-08-31 DIAGNOSIS — F3162 Bipolar disorder, current episode mixed, moderate: Secondary | ICD-10-CM | POA: Diagnosis not present

## 2024-08-31 DIAGNOSIS — F902 Attention-deficit hyperactivity disorder, combined type: Secondary | ICD-10-CM | POA: Diagnosis not present

## 2024-08-31 DIAGNOSIS — F633 Trichotillomania: Secondary | ICD-10-CM

## 2024-08-31 DIAGNOSIS — F419 Anxiety disorder, unspecified: Secondary | ICD-10-CM | POA: Diagnosis not present
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
## 2024-09-01 ENCOUNTER — Telehealth: Admitting: Psychiatry

## 2024-09-01 ENCOUNTER — Encounter: Payer: Self-pay | Admitting: Psychiatry

## 2024-09-01 DIAGNOSIS — Z87898 Personal history of other specified conditions: Secondary | ICD-10-CM

## 2024-09-01 DIAGNOSIS — Z9189 Other specified personal risk factors, not elsewhere classified: Secondary | ICD-10-CM

## 2024-09-01 DIAGNOSIS — Z8659 Personal history of other mental and behavioral disorders: Secondary | ICD-10-CM

## 2024-09-01 DIAGNOSIS — F3162 Bipolar disorder, current episode mixed, moderate: Secondary | ICD-10-CM

## 2024-09-01 DIAGNOSIS — F902 Attention-deficit hyperactivity disorder, combined type: Secondary | ICD-10-CM

## 2024-09-01 DIAGNOSIS — F4310 Post-traumatic stress disorder, unspecified: Secondary | ICD-10-CM | POA: Diagnosis not present

## 2024-09-01 DIAGNOSIS — F633 Trichotillomania: Secondary | ICD-10-CM

## 2024-09-01 MED ORDER — QUETIAPINE FUMARATE 25 MG PO TABS: 12.5000 mg | ORAL_TABLET | Freq: Every day | ORAL | 1 refills | Status: DC

## 2024-09-01 MED ORDER — LISDEXAMFETAMINE DIMESYLATE 40 MG PO CAPS: 40.0000 mg | ORAL_CAPSULE | ORAL | 0 refills | Status: DC
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
## 2024-09-01 NOTE — Patient Instructions (Signed)
Please call for EKG - [phone]  Quetiapine Tablets What is this medication? QUETIAPINE (kwe TYE a peen) treats schizophrenia and bipolar disorder. It works by balancing the levels of dopamine and serotonin in your brain, hormones that help regulate mood, behaviors, and thoughts. It belongs to a group of medications called antipsychotics. Antipsychotic medications can be used to treat several kinds of mental health conditions. This medicine may be used for other purposes; ask your health care provider or pharmacist if you have questions. COMMON BRAND NAME(S): Seroquel What should I tell my care team before I take this medication? They need to know if you have any of these conditions: Blockage in your bowels Cataracts Constipation Dementia Diabetes Difficulty swallowing Glaucoma Heart disease High levels of prolactin History of breast cancer History of irregular heartbeat Liver disease Low blood cell levels (white cells, red cells, and platelets) Low blood pressure Parkinson disease Prostate disease Seizures Suicidal thoughts, plans, or attempt by you or a family member Thyroid disease Trouble passing urine An unusual or allergic reaction to quetiapine, other medications, foods, dyes, or preservatives Pregnant or trying to get pregnant Breastfeeding How should I use this medication? Take this medication by mouth with water. Take it as directed on the prescription label at the same time every day. You can take it with or without food. If it upsets your stomach, take it with food. Keep taking it unless your care team tells you to stop. A special MedGuide will be given to you by the pharmacist with each prescription and refill. Be sure to read this information carefully each time. Talk to your care team about the use of this medication in children. While this medication may be prescribed for children as young as 10 years for selected conditions, precautions do apply. People over  77 years of age may have a stronger reaction to this medication and need smaller doses. Overdosage: If you think you have taken too much of this medicine contact a poison control center or emergency room at once. NOTE: This medicine is only for you. Do not share this medicine with others. What if I miss a dose? If you miss a dose, take it as soon as you can. If it is almost time for your next dose, take only that dose. Do not take double or extra doses. What may interact with this medication? Do not take this medication with any of the following: Cisapride Dronedarone Metoclopramide Pimozide Thioridazine This medication may also interact with the following: Alcohol Antihistamines for allergy, cough, and cold Atropine Avasimibe Certain antivirals for HIV or hepatitis Certain medications for anxiety or sleep Certain medications for bladder problems, such as oxybutynin, tolterodine Certain medications for depression, such as amitriptyline, fluoxetine, nefazodone, sertraline Certain medications for fungal infections, such as fluconazole, ketoconazole, itraconazole, posaconazole Certain medications for stomach problems, such as dicyclomine, hyoscyamine Certain medications for travel sickness, such as scopolamine Cimetidine General anesthetics, such as halothane, isoflurane, methoxyflurane, propofol Ipratropium Levodopa or other medications for Parkinson disease Medications for blood pressure Medications for seizures Medications that relax muscles for surgery Opioid medications for pain Other medications that cause heart rhythm changes Phenothiazines, such as chlorpromazine, prochlorperazine Rifampin St. John's wort This list may not describe all possible interactions. Give your health care provider a list of all the medicines, herbs, non-prescription drugs, or dietary supplements you use. Also tell them if you smoke, drink alcohol, or use illegal drugs. Some items may interact with  your medicine. What should I watch for while using  this medication? Visit your care team for regular checks on your progress. Tell your care team if your symptoms do not start to get better or if they get worse. Do not suddenly stop taking This medication. You may develop a severe reaction. Your care team will tell you how much medication to take. If your care team wants you to stop the medication, the dose may be slowly lowered over time to avoid any side effects. You may need to have an eye exam before and during use of this medication. This medication may increase blood sugar. Ask your care team if changes in diet or medications are needed if you have diabetes. This medication may cause thoughts of suicide or depression. This includes sudden changes in mood, behaviors, or thoughts. These changes can happen at any time but are more common in the beginning of treatment or after a change in dose. Call your care team right away if you experience these thoughts or worsening depression. This medication may affect your coordination, reaction time, or judgment. Do not drive or operate machinery until you know how this medication affects you. Sit up or stand slowly to reduce the risk of dizzy or fainting spells. Drinking alcohol with this medication can increase the risk of these side effects. This medication can cause problems with controlling your body temperature. It can lower the response of your body to cold temperatures. If possible, stay indoors during cold weather. If you must go outdoors, wear warm clothes. It can also lower the response of your body to heat. Do not overheat. Do not over-exercise. Stay out of the sun when possible. If you must be in the sun, wear cool clothing. Drink plenty of water. If you have trouble controlling your body temperature, call your care team right away. What side effects may I notice from receiving this medication? Side effects that you should report to your care team as  soon as possible: Allergic reactions--skin rash, itching, hives, swelling of the face, lips, tongue, or throat Heart rhythm changes--fast or irregular heartbeat, dizziness, feeling faint or lightheaded, chest pain, trouble breathing High blood sugar (hyperglycemia)--increased thirst or amount of urine, unusual weakness or fatigue, blurry vision High fever, stiff muscles, increased sweating, fast or irregular heartbeat, and confusion, which may be signs of neuroleptic malignant syndrome High prolactin level--unexpected breast tissue growth, discharge from the nipple, change in sex drive or performance, irregular menstrual cycle Increase in blood pressure in children Infection--fever, chills, cough, or sore throat Low blood pressure--dizziness, feeling faint or lightheaded, blurry vision Low thyroid levels (hypothyroidism)--unusual weakness or fatigue, increased sensitivity to cold, constipation, hair loss, dry skin, weight gain, feelings of depression Pain or trouble swallowing Seizures Stroke--sudden numbness or weakness of the face, arm, or leg, trouble speaking, confusion, trouble walking, loss of balance or coordination, dizziness, severe headache, change in vision Sudden eye pain or change in vision such as blurry vision, seeing halos around lights, vision loss Thoughts of suicide or self-harm, worsening mood, feelings of depression Trouble passing urine Uncontrolled and repetitive body movements, muscle stiffness or spasms, tremors or shaking, loss of balance or coordination, restlessness, shuffling walk, which may be signs of extrapyramidal symptoms (EPS) Side effects that usually do not require medical attention (report to your care team if they continue or are bothersome): Constipation Dizziness Drowsiness Dry mouth Weight gain This list may not describe all possible side effects. Call your doctor for medical advice about side effects. You may report side effects to FDA at  1-800-FDA-1088.  Where should I keep my medication? Keep out of the reach of children. Store at room temperature between 15 and 30 degrees C (59 and 86 degrees F). Throw away any unused medication after the expiration date. NOTE: This sheet is a summary. It may not cover all possible information. If you have questions about this medicine, talk to your doctor, pharmacist, or health care provider.  2024 Elsevier/Gold Standard (2022-05-28 00:00:00)

## 2024-09-05 ENCOUNTER — Other Ambulatory Visit: Payer: Self-pay | Admitting: Psychiatry

## 2024-09-05 DIAGNOSIS — F3162 Bipolar disorder, current episode mixed, moderate: Secondary | ICD-10-CM

## 2024-09-05 DIAGNOSIS — F4310 Post-traumatic stress disorder, unspecified: Secondary | ICD-10-CM

## 2024-09-14 ENCOUNTER — Ambulatory Visit (INDEPENDENT_AMBULATORY_CARE_PROVIDER_SITE_OTHER): Admitting: Licensed Clinical Social Worker

## 2024-09-14 ENCOUNTER — Encounter: Payer: Self-pay | Admitting: Licensed Clinical Social Worker

## 2024-09-14 DIAGNOSIS — F902 Attention-deficit hyperactivity disorder, combined type: Secondary | ICD-10-CM | POA: Diagnosis not present

## 2024-09-14 DIAGNOSIS — Z87898 Personal history of other specified conditions: Secondary | ICD-10-CM

## 2024-09-14 DIAGNOSIS — F3162 Bipolar disorder, current episode mixed, moderate: Secondary | ICD-10-CM | POA: Diagnosis not present

## 2024-09-14 DIAGNOSIS — F633 Trichotillomania: Secondary | ICD-10-CM

## 2024-09-14 DIAGNOSIS — F4310 Post-traumatic stress disorder, unspecified: Secondary | ICD-10-CM

## 2024-09-14 DIAGNOSIS — F419 Anxiety disorder, unspecified: Secondary | ICD-10-CM
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
## 2024-09-25 ENCOUNTER — Other Ambulatory Visit: Payer: Self-pay | Admitting: Psychiatry

## 2024-09-25 DIAGNOSIS — F4310 Post-traumatic stress disorder, unspecified: Secondary | ICD-10-CM

## 2024-09-25 DIAGNOSIS — F3162 Bipolar disorder, current episode mixed, moderate: Secondary | ICD-10-CM

## 2024-09-28 ENCOUNTER — Ambulatory Visit (INDEPENDENT_AMBULATORY_CARE_PROVIDER_SITE_OTHER): Admitting: Licensed Clinical Social Worker

## 2024-09-28 DIAGNOSIS — F4310 Post-traumatic stress disorder, unspecified: Secondary | ICD-10-CM | POA: Diagnosis not present

## 2024-09-28 DIAGNOSIS — F902 Attention-deficit hyperactivity disorder, combined type: Secondary | ICD-10-CM

## 2024-09-28 DIAGNOSIS — F419 Anxiety disorder, unspecified: Secondary | ICD-10-CM

## 2024-09-28 DIAGNOSIS — Z87898 Personal history of other specified conditions: Secondary | ICD-10-CM

## 2024-09-28 DIAGNOSIS — F3162 Bipolar disorder, current episode mixed, moderate: Secondary | ICD-10-CM | POA: Diagnosis not present

## 2024-09-28 DIAGNOSIS — F633 Trichotillomania: Secondary | ICD-10-CM
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
## 2024-09-29 ENCOUNTER — Encounter: Payer: Self-pay | Admitting: Licensed Clinical Social Worker

## 2024-09-30 ENCOUNTER — Telehealth: Admitting: Psychiatry

## 2024-09-30 ENCOUNTER — Encounter: Payer: Self-pay | Admitting: Psychiatry

## 2024-09-30 DIAGNOSIS — F633 Trichotillomania: Secondary | ICD-10-CM | POA: Diagnosis not present

## 2024-09-30 DIAGNOSIS — F902 Attention-deficit hyperactivity disorder, combined type: Secondary | ICD-10-CM | POA: Diagnosis not present

## 2024-09-30 DIAGNOSIS — F3162 Bipolar disorder, current episode mixed, moderate: Secondary | ICD-10-CM

## 2024-09-30 DIAGNOSIS — F4310 Post-traumatic stress disorder, unspecified: Secondary | ICD-10-CM

## 2024-09-30 DIAGNOSIS — Z87898 Personal history of other specified conditions: Secondary | ICD-10-CM

## 2024-09-30 NOTE — Patient Instructions (Signed)
 Please call for [phone]

## 2024-09-30 NOTE — Progress Notes (Signed)
 Virtual Visit via Video Note  I connected with [name] on 09/30/24 at 11:30 AM EST by a video enabled telemedicine application and verified that I am speaking with the correct person using two identifiers.  Location Provider Location : [facility] Patient Location : Work  Participants: Patient , Provider   I discussed the limitations of evaluation and management by telemedicine and the availability of in person appointments. The patient expressed understanding and agreed to proceed.   I discussed the assessment and treatment plan with the patient. The patient was provided an opportunity to ask questions and all were answered. The patient agreed with the plan and demonstrated an understanding of the instructions.   The patient was advised to call back or seek an in-person evaluation if the symptoms worsen or if the condition fails to improve as anticipated.    BH MD OP Progress Note  09/30/2024 1:11 PM Aubreana Cornacchia  MRN:  [id number]  Chief Complaint:  Chief Complaint  Patient presents with   Follow-up   Anxiety   Depression   Medication Refill   Discussed the use of AI scribe software for clinical note transcription with the patient, who gave verbal consent to proceed.  History of Present Illness Arya Luttrull is a 33 year old Caucasian female, currently employed, single, lives in [place], has a history of bipolar disorder type I, ADHD, PTSD, trichotillomania, anxiety disorder unspecified, polysubstance abuse in remission was evaluated by telemedicine today.  She recently experienced an abrupt onset of severe mood symptoms, with pronounced highs and lows occurring without warning. She reports that her mood swings as extremes and remain as severe as before, and she feels unable to turn it off. She continues to have significant difficulty functioning at work, and she states she is falling behind with tasks taking longer to complete. She also notes  poor performance in her most recent school term, linking this to her symptoms and expressing hope that a change in routine will help.  She continues to experience sleep disturbance, with worsening insomnia and difficulty initiating sleep. She has tried various strategies to improve sleep, such as using white and gray noise machines, earplugs, and avoiding television, but these have not helped. She states she did not sleep at all the previous night. She currently takes Seroquel  (quetiapine ) 25 mg to 50 mg at bedtime, typically around 8:00 to 8:30 PM, but reports no noticeable benefit or side effects. She also takes Lamictal  (lamotrigine ) 150 mg daily, split as 75 mg twice a day, but finds the divided dosing stressful and difficult to maintain consistently, sometimes missing doses. She reports that even when she is mostly consistent with Lamictal , she does not feel any improvement in her symptoms.  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 She links the escalation of these symptoms to recent stressors, including cutting off contact with her mother and attempting to repair her relationship with her father, who was recently diagnosed with prostate cancer. She describes these family stressors as contributing to her emotional distress and trichotillomania.  She attends therapy sessions with Ms.Kristina every 2 weeks, and she works on strategies and homework assignments, though she anticipates difficulty with some tasks. She describes recent realizations in therapy and remains actively engaged in treatment.  She denies any thoughts of hurting herself or others and denies paranoia or psychosis.  She takes Vyvanse  for ADHD  only as needed, primarily when she needs to focus at work or on homework, and does not take it daily. She states she has not been taking hydroxyzine  at night, as she is unsure if  it would interfere with Seroquel , and she prefers to keep it out of her nighttime routine. She reports taking trazodone  only when her children are not present and she does not need to wake up with them the next day.    Visit Diagnosis:    ICD-10-CM   1. Bipolar 1 disorder, mixed, moderate (HCC)  F31.62     2. PTSD (post-traumatic stress disorder)  F43.10     3. Attention deficit hyperactivity disorder (ADHD), combined type  F90.2     4. 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 History of substance use disorder  Z87.898    Cocaine, Xanax and heroin in remission      Past Psychiatric History: I have reviewed past psychiatric history from progress note on 02/01/2024.  I have reviewed past psychiatric history from progress note on 04/21/2024.  Past Medical History:  Past Medical History:  Diagnosis Date   Anxiety    Bipolar I disorder, most recent episode depressed, severe without psychotic features (HCC)    Constipation    Depression    Headache    Migraines    Mini stroke    Schizophrenia [facility])     Past Surgical History:  Procedure Laterality Date   CESAREAN SECTION     CESAREAN SECTION N/A 02/26/2018   Procedure: REPEAT CESAREAN SECTION;  Surgeon: Connell Davies, MD;  Location: [facility] ORS;  Service: Obstetrics;  Laterality: N/A;   COLONOSCOPY WITH PROPOFOL  N/A 05/13/2023   Procedure: COLONOSCOPY WITH PROPOFOL ;  Surgeon: Unk Corinn Skiff, MD;  Location: [facility] ENDOSCOPY;  Service: Gastroenterology;  Laterality: N/A;   WISDOM TOOTH EXTRACTION      Family Psychiatric History: I have reviewed family psychiatric history from progress note on 02/01/2024.  Family History:  Family History  Problem Relation Age of Onset   Depression Mother    Migraines Mother    ADD / ADHD Father    Depression Father    Asthma Brother    Hyperlipidemia Brother    ADD / ADHD Maternal Aunt    Thyroid  disease Maternal Aunt    Cancer Maternal Aunt        melanoma   Heart failure Maternal Grandfather     Hyperlipidemia Maternal Grandfather    Asthma Maternal Grandmother    Hyperlipidemia Maternal Grandmother    Bipolar disorder Paternal Grandfather    Suicidality Paternal Grandfather    Heart failure Paternal Grandmother    Cancer Paternal Grandmother        tumor   ADD / ADHD Cousin    ADD / ADHD Son    Asthma Son     Social History: I have reviewed social history from progress note on 02/01/2024. Social History   Socioeconomic History   Marital status: Divorced    Spouse name: Not on file   Number of children: Not on file   Years of education: Not on file   Highest education level: Associate degree: occupational, scientist, product/process development, or vocational program  Occupational History   Not on file  Tobacco Use   Smoking status: Former    Current packs/day: 0.00    Average packs/day: 0.5 packs/day for 4.0 years (2.0 ttl pk-yrs)    Types: Cigarettes    Start date: 06/26/2013    Quit date: 06/26/2017  Years since quitting: 7.2   Smokeless tobacco: Never  Vaping Use   Vaping status: Some Days  Substance and Sexual Activity   Alcohol use: Not Currently    Alcohol/week: 16.0 standard drinks of alcohol    Types: 16 Glasses of wine per week    Comment: wine at night, occasionally   Drug use: Not Currently    Types: Amphetamines   Sexual activity: Yes    Partners: Male    Birth control/protection: None, Surgical    Comment: Tubal Ligation  Other Topics Concern   Not on file  Social History Narrative   ** Merged History Encounter **       Social Drivers of Health   Financial Resource Strain: Low Risk  (05/06/2024)   Overall Financial Resource Strain (CARDIA)    Difficulty of Paying Living Expenses: Not hard at all  Food Insecurity: No Food Insecurity (05/06/2024)   Hunger Vital Sign    Worried About Running Out of Food in the Last Year: Never true    Ran Out of Food in the Last Year: Never true  Transportation Needs: No Transportation Needs (05/06/2024)   PRAPARE - Therapist, Art (Medical): No    Lack of Transportation (Non-Medical): No  Physical Activity: Sufficiently Active (03/20/2023)   Exercise Vital Sign    Days of Exercise per Week: 5 days    Minutes of Exercise per Session: 30 min  Stress: Stress Concern Present (05/06/2024)   [employer] of Occupational Health - Occupational Stress Questionnaire    Feeling of Stress : Very much  Social Connections: Socially Isolated (03/20/2023)   Social Connection and Isolation Panel    Frequency of Communication with Friends and Family: More than three times a week    Frequency of Social Gatherings with Friends and Family: Twice a week    Attends Religious Services: Never    Database Administrator or Organizations: No    Attends Engineer, Structural: Not on file    Marital Status: Divorced    Allergies:  Allergies  Allergen Reactions   Amoxicillin Hives and Rash   Penicillins Hives and Rash    Has patient had a PCN reaction causing immediate rash, facial/tongue/throat swelling, SOB or lightheadedness with hypotension: Yes Has patient had a PCN reaction causing severe rash involving mucus membranes or skin necrosis: Yes Has patient had a PCN reaction that required hospitalization: No Has patient had a PCN reaction occurring within the last 10 years: Yes If all of the above answers are NO, then may proceed with Cephalosporin use.    Adhesive [Tape]     Pull skin off.  Both paper tape and tegaderm are ok   Latex Rash   Nickel Hives and Rash    Metabolic Disorder Labs: Lab Results  Component Value Date   HGBA1C 4.7 (L) 01/31/2019   MPG 88.19 01/31/2019   No results found for: PROLACTIN Lab Results  Component Value Date   CHOL 120 01/31/2019   TRIG 147 01/31/2019   HDL 50 01/31/2019   CHOLHDL 2.4 01/31/2019   VLDL 29 01/31/2019   LDLCALC 41 01/31/2019   Lab Results  Component Value Date   TSH 2.789 02/17/2024   TSH 1.540 10/04/2022    Therapeutic Level  Labs: No results found for: LITHIUM No results found for: VALPROATE Lab Results  Component Value Date   CBMZ 3.4 (L) 06/11/2019    Current Medications: Current Outpatient Medications  Medication Sig Dispense  Refill   diclofenac (VOLTAREN) 75 MG EC tablet Take 75 mg twice daily for 1 month, then decrease to 75 mg once daily for 1 month, then stop taking.     hydrOXYzine  (VISTARIL ) 50 MG capsule Take 1 capsule (50 mg total) by mouth 3 (three) times daily as needed for anxiety. 90 capsule 1   lamoTRIgine  (LAMICTAL ) 150 MG tablet Take 1 tablet (150 mg total) by mouth daily. Dose increase 30 tablet 1   lisdexamfetamine  (VYVANSE ) 40 MG capsule Take 1 capsule (40 mg total) by mouth every morning. 30 capsule 0   Multiple Vitamin (MULTI-VITAMIN) tablet Take 1 tablet by mouth daily.     ondansetron  (ZOFRAN -ODT) 4 MG disintegrating tablet Take 1 tablet (4 mg total) by mouth every 8 (eight) hours as needed for nausea or vomiting. 20 tablet 0   QUEtiapine  (SEROQUEL ) 25 MG tablet TAKE 0.5-1 TABLETS (12.5-25 MG TOTAL) BY MOUTH AT BEDTIME. 90 tablet 0   SUMAtriptan  (IMITREX ) 25 MG tablet Take 1 tablet (25 mg total) by mouth every 2 (two) hours as needed for migraine. May repeat in 2 hours if headache persists or recurs. 10 tablet 0   traZODone  (DESYREL ) 50 MG tablet Take 0.5-1 tablets (25-50 mg total) by mouth at bedtime as needed for sleep. 30 tablet 1   Vitamin D , Ergocalciferol , (DRISDOL ) 1.25 MG (50000 UNIT) CAPS capsule Take 1 capsule (50,000 Units total) by mouth every 7 (seven) days. 26 capsule 1   No current facility-administered medications for this visit.     Musculoskeletal: Strength & Muscle Tone: UTA Gait & Station: Seated Patient leans: N/A  Psychiatric Specialty Exam: Review of Systems  Psychiatric/Behavioral:  Positive for sleep disturbance. The patient is nervous/anxious.        Irritable    There were no vitals taken for this visit.There is no height or weight on file to  calculate BMI.  General Appearance: Casual  Eye Contact:  Fair  Speech:  Clear and Coherent  Volume:  Normal  Mood:  Anxious, Irritable, and mood swings  Affect:  Congruent  Thought Process:  Goal Directed and Descriptions of Associations: Intact  Orientation:  Full (Time, Place, and Person)  Thought Content: Logical   Suicidal Thoughts:  No  Homicidal Thoughts:  No  Memory:  Immediate;   Fair Recent;   Fair Remote;   Fair  Judgement:  Fair  Insight:  Fair  Psychomotor Activity:  Normal  Concentration:  Concentration: Fair and Attention Span: Fair  Recall:  [employer] of Knowledge: Fair  Language: Fair  Akathisia:  No  Handed:  Right  AIMS (if indicated): not done  Assets:  Communication Skills Desire for Improvement Housing Social Support Transportation  ADL's:  Intact  Cognition: WNL  Sleep:  Poor   Screenings: AIMS    Flowsheet Row Admission (Discharged) from 01/31/2019 in [facility] INPATIENT BEHAVIORAL MEDICINE  AIMS Total Score 0   AUDIT    Flowsheet Row Admission (Discharged) from 01/31/2019 in [facility] INPATIENT BEHAVIORAL MEDICINE  Alcohol Use Disorder Identification Test Final Score (AUDIT) 0   GAD-7    Flowsheet Row Office Visit from 05/06/2024 in [facility] Counselor from 04/22/2024 in [facility] Office Visit from 03/05/2024 in [facility] Office Visit from 01/17/2024 in [facility] Office Visit from 04/17/2023 in [facility]  Total GAD-7 Score 16 19 15 21 21    PHQ2-9    Flowsheet Row  Office Visit from 05/06/2024 in [facility] Counselor from 04/22/2024 in [facility] Office Visit from 03/05/2024 in [facility] Office Visit from 01/17/2024 in [facility] Office Visit from  04/17/2023 in [facility]  PHQ-2 Total Score 6 6 6 6 6   PHQ-9 Total Score 24 26 22 24 24    Flowsheet Row Video Visit from 09/30/2024 in [facility] Video Visit from 09/01/2024 in [facility] Office Visit from 07/28/2024 in [facility]  C-SSRS RISK CATEGORY Moderate Risk Moderate Risk Moderate Risk     Assessment and Plan: Seda Kronberg is a 33 year old Caucasian female who presented for a follow-up appointment, discussed assessment and plan as noted below.  1. Bipolar 1 disorder, mixed, moderate (HCC)-unstable Currently presents with ongoing mood lability, irritability and sleep problems.  Also reports difficulty taking Lamictal  consistently.  Believes Lamictal  is not effective.  Denies any side effects to Seroquel , agreeable to dosage increase. Consider increasing Seroquel  to 50 mg at bedtime for a week and then to 100 mg. However will get EKG, patient has been noncompliant.  Once I review EKG will consider increasing the dosage. Continue Lamictal  150 mg daily for now. Plan to gradually taper of Lamictal  while Seroquel  is uptitrated.  2. PTSD (post-traumatic stress disorder)-unstable Ongoing mood symptoms intrusive memories, triggered by situational/interpersonal problems. Encouraged to continue psychotherapy sessions with Ms. Evalene Husband. Continue Trazodone  50 mg half to 1 tablet at bedtime as needed, rarely uses it. Continue Hydroxyzine  50 mg as needed for sleep and anxiety.  3. Attention deficit hyperactivity disorder (ADHD), combined type-unstable Current focus problems likely due to ongoing mood symptoms.  Does take the Vyvanse  although not consistently.  Continue Vyvanse  40 mg daily for now. However will consider changing to a nonstimulant medication if mood and sleep are affected.   4. Trichotillomania-unstable Worsening  hair pulling behavior since the past couple of weeks mostly due to situational stress. Encouraged to continue psychotherapy sessions. Continue Hydroxyzine  50 mg as needed  5. History of substance use disorder Currently sober.  Pending EKG, patient advised to get EKG completed prior to initiation of a higher dosage of Seroquel .  Follow-up Follow-up in clinic in 2 to 3 weeks or sooner if needed.    Collaboration of Care: Collaboration of Care: Referral or follow-up with counselor/therapist AEB patient encouraged to continue psychotherapy sessionsreviewed notes dated 09/28/2024 patient to continue psychotherapy sessions.  Patient/Guardian was advised Release of Information must be obtained prior to any record release in order to collaborate their care with an outside provider. Patient/Guardian was advised if they have not already done so to contact the registration department to sign all necessary forms in order for us  to release information regarding their care.   Consent: Patient/Guardian gives verbal consent for treatment and assignment of benefits for services provided during this visit. Patient/Guardian expressed understanding and agreed to proceed.   This note was generated in part or whole with voice recognition software. Voice recognition is usually quite accurate but there are transcription errors that can and very often do occur. I apologize for any typographical errors that were not detected and corrected.    Tyronica Truxillo, MD 10/01/2024, 9:47 AM

## 2024-10-12 ENCOUNTER — Ambulatory Visit (INDEPENDENT_AMBULATORY_CARE_PROVIDER_SITE_OTHER): Admitting: Licensed Clinical Social Worker

## 2024-10-12 DIAGNOSIS — F633 Trichotillomania: Secondary | ICD-10-CM | POA: Diagnosis not present

## 2024-10-12 DIAGNOSIS — F902 Attention-deficit hyperactivity disorder, combined type: Secondary | ICD-10-CM

## 2024-10-12 DIAGNOSIS — F4310 Post-traumatic stress disorder, unspecified: Secondary | ICD-10-CM | POA: Diagnosis not present

## 2024-10-12 DIAGNOSIS — Z87898 Personal history of other specified conditions: Secondary | ICD-10-CM

## 2024-10-12 DIAGNOSIS — F3162 Bipolar disorder, current episode mixed, moderate: Secondary | ICD-10-CM

## 2024-10-12 NOTE — Progress Notes (Signed)
 THERAPIST PROGRESS NOTE  Virtual Visit via Video Note  I connected with Terri Holt on 10/12/24 at 11:04 by a video enabled telemedicine application and verified that I am speaking with the correct person using two identifiers.  Location: Patient: Work Address Provider: Providers Address   I discussed the limitations of evaluation and management by telemedicine and the availability of in person appointments. The patient expressed understanding and agreed to proceed.  I discussed the assessment and treatment plan with the patient. The patient was provided an opportunity to ask questions and all were answered. The patient agreed with the plan and demonstrated an understanding of the instructions.   The patient was advised to call back or seek an in-person evaluation if the symptoms worsen or if the condition fails to improve as anticipated.  I provided 56 minutes of non-face-to-face time during this encounter.   Terri Holt Husband, LCSW   Session Time: 11:04-12:02pm  Participation Level: Active  Behavioral Response: CasualAlertDepressed  Type of Therapy: Individual Therapy  Treatment Goals addressed:  Active     BH CCP Acute or Chronic Trauma Reaction     LTG: Elimination of maladaptive behaviors and thinking patterns which interfere with resolution of trauma as evidenced by self report (Progressing)     Start:  04/22/24    Expected End:  12/15/24         LTG: Develop and implement effective coping skills to carry out normal responsibilities and participate constructively in relationships as evidenced by self report (Progressing)     Start:  04/22/24    Expected End:  12/15/24         LTG: try and unpack reactions and self-preservation from past trauma  (Progressing)     Start:  04/22/24    Expected End:  12/15/24         STG: Terri Holt will identify internal and external stimuli that trigger PTSD symptoms (Progressing)     Start:  04/22/24     Expected End:  12/15/24         STG: Terri Holt will verbalize an increased sense of mastery over PTSD symptoms by using several techniques to cope with flashbacks, decrease the power of triggers, and decrease negative thinking (Progressing)     Start:  04/22/24    Expected End:  12/15/24         Cooperate with trauma-focused psychotherapy techniques to reduce emotional reaction to the traumatic event      Start:  04/22/24         Educate Terri Holt as to the origins of PTSD, common symptoms, and how it impacts those affected by it     Start:  04/22/24         Assess whether Terri Holt experiences dissociative symptoms (e.g., flashbacks, memory loss, identity disorder), and treat or refer for treatment     Start:  04/22/24         Work with Terri Holt to construct a list of the situations, people, & places that [name] evoke the most distressing symptoms; suggest that they keep a journal of instances of stress being triggered     Start:  04/22/24         Teach Terri Holt coping strategies (e.g., writing down thoughts and feelings in a journal; taking deep, slow breaths; calling a support person to talk about memories) to deal with trauma memories and sudden emotional reactions without becoming emo     Start:  04/22/24  ProgressTowards Goals: Progressing  Interventions: CBT, Supportive, Reframing, and Other: Mindfulness  Summary: Terri Holt is a 33 y.o. female who presents with hypervigilance, flashbacks, detachment from others, emotional numbing, and nightmares. Per documentation by her psychiatrist, she has a "history of self-harm behaviors, including a suicide attempt in 2020 following her separation from her ex-husband. She has been hospitalized multiple times for psychiatric reasons, with the most recent admission in 2020." Pt was oriented times 5. Pt was cooperative and engaged. Denies SI/HI/AVH.  The  patient reports that she is falling asleep frequently and experiencing difficulties with drowsiness. She is confused about the medications she is supposed to be taking and states, My depression is really bad, and the tiredness is making me angry.  She expressed feelings of parenting alone and is questioning her ability to support her children, particularly with their behavioral struggles. She reflected on factors that she can control. Cln and patient worked together to [place].   We explored her compliance with medication and the requests made by her psychiatrist during a previous appointment.  The patient reports that she has lost 14 pounds since September 2025 due to stress.  Cln educated her on grounding techniques, including 7-11 breathing, acupressure, and bilateral tapping. Patient will practice these skills routinely until her next session. Patient reports relief with the coping skills stating surprise that they were effective for her.   Suicidal/Homicidal: Nowithout intent/plan  Therapist Response: Clinician utilized active and supportive reflection to create a safe space for patient to process recent life experiences. Clinician assessed for current symptoms, stressors, safety since last session. Worked with patient on shifting focus on controllable factors. Educated the patient on mindfulness techniques.   Plan: Return again in 2 weeks.  Diagnosis:Bipolar 1 disorder, mixed, moderate (HCC)  PTSD (post-traumatic stress disorder)  Attention deficit hyperactivity disorder (ADHD), combined type  Trichotillomania  History of substance use disorder   Collaboration of Care: AEB psychiatrist can access notes and cln. Will review psychiatrists' notes. Check in with the patient and will see LCSW per availability. Patient agreed with treatment recommendations.   Patient/Guardian was advised Release of Information must be obtained prior to any record release in order to  collaborate their care with an outside provider. Patient/Guardian was advised if they have not already done so to contact the registration department to sign all necessary forms in order for us  to release information regarding their care.   Consent: Patient/Guardian gives verbal consent for treatment and assignment of benefits for services provided during this visit. Patient/Guardian expressed understanding and agreed to proceed.   Terri Holt Husband, LCSW 10/12/2024

## 2024-10-13 ENCOUNTER — Encounter: Payer: Self-pay | Admitting: Licensed Clinical Social Worker

## 2024-10-16 ENCOUNTER — Ambulatory Visit
Admission: RE | Admit: 2024-10-16 | Discharge: 2024-10-16 | Disposition: A | Discharge status: Home / Self Care | Source: Ambulatory Visit | Attending: Psychiatry | Admitting: Psychiatry

## 2024-10-16 DIAGNOSIS — Z9189 Other specified personal risk factors, not elsewhere classified: Secondary | ICD-10-CM | POA: Insufficient documentation

## 2024-10-16 DIAGNOSIS — Z5181 Encounter for therapeutic drug level monitoring: Secondary | ICD-10-CM | POA: Diagnosis not present

## 2024-10-20 ENCOUNTER — Encounter: Payer: Self-pay | Admitting: Psychiatry

## 2024-10-20 ENCOUNTER — Telehealth: Admitting: Psychiatry

## 2024-10-20 DIAGNOSIS — F902 Attention-deficit hyperactivity disorder, combined type: Secondary | ICD-10-CM

## 2024-10-20 DIAGNOSIS — F633 Trichotillomania: Secondary | ICD-10-CM | POA: Diagnosis not present

## 2024-10-20 DIAGNOSIS — Z87898 Personal history of other specified conditions: Secondary | ICD-10-CM

## 2024-10-20 DIAGNOSIS — F4310 Post-traumatic stress disorder, unspecified: Secondary | ICD-10-CM

## 2024-10-20 DIAGNOSIS — F3162 Bipolar disorder, current episode mixed, moderate: Secondary | ICD-10-CM

## 2024-10-20 MED ORDER — ARIPIPRAZOLE 5 MG PO TABS: 2.5000 mg | ORAL_TABLET | Freq: Every morning | ORAL | 0 refills | Status: DC

## 2024-10-20 MED ORDER — LISDEXAMFETAMINE DIMESYLATE 40 MG PO CAPS: 40.0000 mg | ORAL_CAPSULE | ORAL | 0 refills | Status: DC

## 2024-10-20 MED ORDER — LAMOTRIGINE 150 MG PO TABS: 150.0000 mg | ORAL_TABLET | Freq: Every day | ORAL | 1 refills | Status: DC

## 2024-10-20 NOTE — Progress Notes (Signed)
 Virtual Visit via Video Note  I connected with Terri Holt on 10/20/24 at  4:30 PM EST by a video enabled telemedicine application and verified that I am speaking with the correct person using two identifiers.  Location Provider Location : [facility] Patient Location : Work  Participants: Patient , Provider   I discussed the limitations of evaluation and management by telemedicine and the availability of in person appointments. The patient expressed understanding and agreed to proceed.    I discussed the assessment and treatment plan with the patient. The patient was provided an opportunity to ask questions and all were answered. The patient agreed with the plan and demonstrated an understanding of the instructions.   The patient was advised to call back or seek an in-person evaluation if the symptoms worsen or if the condition fails to improve as anticipated.  BH MD OP Progress Note  10/20/2024 5:00 PM Terri Holt  MRN:  [id number]  Chief Complaint:  Chief Complaint  Patient presents with   Follow-up   Depression   Anxiety   Irritability   Medication Refill   Discussed the use of AI scribe software for clinical note transcription with the patient, who gave verbal consent to proceed.  History of Present Illness Terri Holt) is a 33 year old Caucasian female, currently employed, single, lives in [place], has a history of bipolar disorder type I, ADHD, PTSD, trichotillomania, anxiety disorder unspecified, polysubstance abuse in remission was evaluated by telemedicine today.  She has experienced persistent fatigue and difficulty waking in the morning since she started taking Seroquel  at night. She notes that this tiredness did not occur before adding Seroquel  to her regimen and reports that Lamictal  did not previously cause fatigue. She explains that the sedating effects of Seroquel  have made it difficult to get up in the morning and have  increased her stress, especially given the evening chaos at home.  Her current medications include Lamictal  150 mg daily, Seroquel  25 mg at night, Vyvanse  40 mg daily, and trazodone  at night for sleep when her children are not home. She finds it easier to take medications in the morning and describes split dosing between morning and evening as challenging due to her home environment.  She denies suicidal thoughts and thoughts of harming others. Ongoing stress related to family pressures and the holidays continues.She identifies her cousin as a key support and plans to spend Thanksgiving and Christmas with her cousin's family to avoid being alone.  She continues to follow up with her therapist, Ms. Evalene Husband, and reports making progress in therapy, particularly with calming exercises such as tapping to help manage emotional distress.    Visit Diagnosis:    ICD-10-CM   1. Bipolar 1 disorder, mixed, moderate (HCC)  F31.62 ARIPiprazole  (ABILIFY ) 5 MG tablet    lamoTRIgine  (LAMICTAL ) 150 MG tablet    2. PTSD (post-traumatic stress disorder)  F43.10 lamoTRIgine  (LAMICTAL ) 150 MG tablet    3. Attention deficit hyperactivity disorder (ADHD), combined type  F90.2 lisdexamfetamine  (VYVANSE ) 40 MG capsule    4. 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 History of substance use disorder  Z87.898    Cocaine, Xanax and heroin in remission      Past Psychiatric History: I have reviewed past psychiatric history from progress note on 02/01/2024.  I have reviewed past psychiatric history from progress note on 04/21/2024.  Past Medical History:  Past Medical History:  Diagnosis Date   Anxiety    Bipolar I disorder,  most recent episode depressed, severe without psychotic features (HCC)    Constipation    Depression    Headache    Migraines    Mini stroke    Schizophrenia [facility])     Past Surgical History:  Procedure Laterality Date   CESAREAN SECTION     CESAREAN SECTION N/A 02/26/2018   Procedure:  REPEAT CESAREAN SECTION;  Surgeon: Connell Davies, MD;  Location: [facility] ORS;  Service: Obstetrics;  Laterality: N/A;   COLONOSCOPY WITH PROPOFOL  N/A 05/13/2023   Procedure: COLONOSCOPY WITH PROPOFOL ;  Surgeon: Unk Corinn Skiff, MD;  Location: [facility] ENDOSCOPY;  Service: Gastroenterology;  Laterality: N/A;   WISDOM TOOTH EXTRACTION      Family Psychiatric History: I have reviewed family psychiatric history from progress note on 02/01/2024.  Family History:  Family History  Problem Relation Age of Onset   Depression Mother    Migraines Mother    ADD / ADHD Father    Depression Father    Asthma Brother    Hyperlipidemia Brother    ADD / ADHD Maternal Aunt    Thyroid  disease Maternal Aunt    Cancer Maternal Aunt        melanoma   Heart failure Maternal Grandfather    Hyperlipidemia Maternal Grandfather    Asthma Maternal Grandmother    Hyperlipidemia Maternal Grandmother    Bipolar disorder Paternal Grandfather    Suicidality Paternal Grandfather    Heart failure Paternal Grandmother    Cancer Paternal Grandmother        tumor   ADD / ADHD Cousin    ADD / ADHD Son    Asthma Son     Social History: I have reviewed social history from progress note on 02/01/2024. Social History   Socioeconomic History   Marital status: Divorced    Spouse name: Not on file   Number of children: Not on file   Years of education: Not on file   Highest education level: Associate degree: occupational, scientist, product/process development, or vocational program  Occupational History   Not on file  Tobacco Use   Smoking status: Former    Current packs/day: 0.00    Average packs/day: 0.5 packs/day for 4.0 years (2.0 ttl pk-yrs)    Types: Cigarettes    Start date: 06/26/2013    Quit date: 06/26/2017    Years since quitting: 7.3   Smokeless tobacco: Never  Vaping Use   Vaping status: Some Days  Substance and Sexual Activity   Alcohol use: Not Currently    Alcohol/week: 16.0 standard drinks of alcohol    Types: 16 Glasses of  wine per week    Comment: wine at night, occasionally   Drug use: Not Currently    Types: Amphetamines   Sexual activity: Yes    Partners: Male    Birth control/protection: None, Surgical    Comment: Tubal Ligation  Other Topics Concern   Not on file  Social History Narrative   ** Merged History Encounter **       Social Drivers of Health   Financial Resource Strain: Low Risk  (05/06/2024)   Overall Financial Resource Strain (CARDIA)    Difficulty of Paying Living Expenses: Not hard at all  Food Insecurity: No Food Insecurity (05/06/2024)   Hunger Vital Sign    Worried About Running Out of Food in the Last Year: Never true    Ran Out of Food in the Last Year: Never true  Transportation Needs: No Transportation Needs (05/06/2024)   PRAPARE - Transportation  Lack of Transportation (Medical): No    Lack of Transportation (Non-Medical): No  Physical Activity: Sufficiently Active (03/20/2023)   Exercise Vital Sign    Days of Exercise per Week: 5 days    Minutes of Exercise per Session: 30 min  Stress: Stress Concern Present (05/06/2024)   [employer] of Occupational Health - Occupational Stress Questionnaire    Feeling of Stress : Very much  Social Connections: Socially Isolated (03/20/2023)   Social Connection and Isolation Panel    Frequency of Communication with Friends and Family: More than three times a week    Frequency of Social Gatherings with Friends and Family: Twice a week    Attends Religious Services: Never    Database Administrator or Organizations: No    Attends Engineer, Structural: Not on file    Marital Status: Divorced    Allergies:  Allergies  Allergen Reactions   Amoxicillin Hives and Rash   Penicillins Hives and Rash    Has patient had a PCN reaction causing immediate rash, facial/tongue/throat swelling, SOB or lightheadedness with hypotension: Yes Has patient had a PCN reaction causing severe rash involving mucus membranes or skin  necrosis: Yes Has patient had a PCN reaction that required hospitalization: No Has patient had a PCN reaction occurring within the last 10 years: Yes If all of the above answers are NO, then may proceed with Cephalosporin use.    Adhesive [Tape]     Pull skin off.  Both paper tape and tegaderm are ok   Latex Rash   Nickel Hives and Rash    Metabolic Disorder Labs: Lab Results  Component Value Date   HGBA1C 4.7 (L) 01/31/2019   MPG 88.19 01/31/2019   No results found for: PROLACTIN Lab Results  Component Value Date   CHOL 120 01/31/2019   TRIG 147 01/31/2019   HDL 50 01/31/2019   CHOLHDL 2.4 01/31/2019   VLDL 29 01/31/2019   LDLCALC 41 01/31/2019   Lab Results  Component Value Date   TSH 2.789 02/17/2024   TSH 1.540 10/04/2022    Therapeutic Level Labs: No results found for: LITHIUM No results found for: VALPROATE Lab Results  Component Value Date   CBMZ 3.4 (L) 06/11/2019    Current Medications: Current Outpatient Medications  Medication Sig Dispense Refill   ARIPiprazole  (ABILIFY ) 5 MG tablet Take 0.5-1 tablets (2.5-5 mg total) by mouth in the morning. Take half tablet daily for 1 week and increase to one tablet daily after that. 30 tablet 0   diclofenac (VOLTAREN) 75 MG EC tablet Take 75 mg twice daily for 1 month, then decrease to 75 mg once daily for 1 month, then stop taking.     hydrOXYzine  (VISTARIL ) 50 MG capsule Take 1 capsule (50 mg total) by mouth 3 (three) times daily as needed for anxiety. 90 capsule 1   lamoTRIgine  (LAMICTAL ) 150 MG tablet Take 1 tablet (150 mg total) by mouth daily. 30 tablet 1   lisdexamfetamine  (VYVANSE ) 40 MG capsule Take 1 capsule (40 mg total) by mouth every morning. 30 capsule 0   Multiple Vitamin (MULTI-VITAMIN) tablet Take 1 tablet by mouth daily.     ondansetron  (ZOFRAN -ODT) 4 MG disintegrating tablet Take 1 tablet (4 mg total) by mouth every 8 (eight) hours as needed for nausea or vomiting. 20 tablet 0   SUMAtriptan   (IMITREX ) 25 MG tablet Take 1 tablet (25 mg total) by mouth every 2 (two) hours as needed for migraine. May repeat in 2  hours if headache persists or recurs. 10 tablet 0   traZODone  (DESYREL ) 50 MG tablet Take 0.5-1 tablets (25-50 mg total) by mouth at bedtime as needed for sleep. 30 tablet 1   Vitamin D , Ergocalciferol , (DRISDOL ) 1.25 MG (50000 UNIT) CAPS capsule Take 1 capsule (50,000 Units total) by mouth every 7 (seven) days. 26 capsule 1   No current facility-administered medications for this visit.     Musculoskeletal: Strength & Muscle Tone: UTA Gait & Station: seated Patient leans: N/A  Psychiatric Specialty Exam: Review of Systems  Psychiatric/Behavioral:  Positive for decreased concentration, dysphoric mood and sleep disturbance. The patient is nervous/anxious.     There were no vitals taken for this visit.There is no height or weight on file to calculate BMI.  General Appearance: Fairly Groomed  Eye Contact:  Fair  Speech:  Clear and Coherent  Volume:  Normal  Mood:  Anxious, Depressed, and Irritable  Affect:  Congruent  Thought Process:  Goal Directed and Descriptions of Associations: Intact  Orientation:  Full (Time, Place, and Person)  Thought Content: Logical   Suicidal Thoughts:  No  Homicidal Thoughts:  No  Memory:  Immediate;   Fair Recent;   Fair Remote;   Fair  Judgement:  Fair  Insight:  Fair  Psychomotor Activity:  Normal  Concentration:  Concentration: Fair and Attention Span: Fair  Recall:  [employer] of Knowledge: Fair  Language: Fair  Akathisia:  No  Handed:  Right  AIMS (if indicated): not done  Assets:  Communication Skills Desire for Improvement Housing Social Support Transportation  ADL's:  Intact  Cognition: WNL  Sleep:  varies   Screenings: AIMS    Flowsheet Row Admission (Discharged) from 01/31/2019 in [facility]  AIMS Total Score 0   AUDIT    Flowsheet Row Admission (Discharged) from 01/31/2019 in [facility]  Alcohol Use Disorder Identification Test Final Score (AUDIT) 0   GAD-7    Flowsheet Row Office Visit from 05/06/2024 in [facility] Counselor from 04/22/2024 in [facility] Office Visit from 03/05/2024 in [facility] Office Visit from 01/17/2024 in [facility] Office Visit from 04/17/2023 in [facility]  Total GAD-7 Score 16 19 15 21 21    PHQ2-9    Flowsheet Row Office Visit from 05/06/2024 in [facility] Counselor from 04/22/2024 in [facility] Office Visit from 03/05/2024 in [facility] Office Visit from 01/17/2024 in [facility] Office Visit from 04/17/2023 in [facility]  PHQ-2 Total Score 6 6 6 6 6   PHQ-9 Total Score 24 26 22 24 24    Flowsheet Row Video Visit from 10/20/2024 in [facility] Video Visit from 09/30/2024 in [facility] Video Visit from 09/01/2024 in [facility]  C-SSRS RISK CATEGORY Moderate Risk Moderate Risk Moderate Risk     Assessment and Plan: Terri Holt is a 33 year old Caucasian female who presented for a follow-up appointment, discussed assessment and plan as noted below.  1. Bipolar 1 disorder, mixed, moderate (HCC)-unstable Currently reports ongoing mood lability, sleep problems, not interested in staying on the Seroquel  since she believes Seroquel  is causing side effects of fatigue and excessive sedation during the day. Discussed Abilify , interested in trial. (EKG reviewed dated 10/15/2024-normal  sinus rhythm with sinus arrhythmia-[id number]) Discontinue Seroquel , could take Seroquel  half tablet  tonight and stop taking it. Start Abilify  2.5 mg daily for a week and increase to 5 mg daily after that. Continue Lamictal  150 mg daily for now.   2. PTSD (post-traumatic stress disorder)-unstable Ongoing mood symptoms triggered by situational stressors.  Currently therapy with Ms. Lanell has been beneficial Encouraged to continue psychotherapy sessions with Ms. Evalene Lanell Continue Trazodone  50 mg half to 1 tablet at bedtime as needed, encouraged to use it for sleep. Continue Hydroxyzine  50 mg as needed for sleep and anxiety  3. Attention deficit hyperactivity disorder (ADHD), combined type-unstable Ongoing problems with focus likely multifactorial including unstable mood/anxiety and sleep problems. Continue Vyvanse  40 mg daily for now.  Will consider changing to a nonstimulant medication in the future if mood and sleep continues to be not controlled.  4. Trichotillomania-unstable Ongoing hair pulling behaviors. Continue psychotherapy sessions Continue Hydroxyzine  50 mg daily as needed  5. History of substance use disorder Currently sober.  Will consider getting urine drug screen in the future.      Collaboration of Care: Collaboration of Care: Referral or follow-up with counselor/therapist AEB encouraged to continue psychotherapy sessions, I have reviewed notes per Ms. Perkins dated 10/12/2024-currently engaged in CBT, supportive, reframing and other mindfulness strategies.  Patient/Guardian was advised Release of Information must be obtained prior to any record release in order to collaborate their care with an outside provider. Patient/Guardian was advised if they have not already done so to contact the registration department to sign all necessary forms in order for us  to release information regarding their care.   Consent: Patient/Guardian gives verbal consent for treatment and assignment of benefits for services provided during this visit. Patient/Guardian expressed  understanding and agreed to proceed.  This note was generated in part or whole with voice recognition software. Voice recognition is usually quite accurate but there are transcription errors that can and very often do occur. I apologize for any typographical errors that were not detected and corrected.     Terri Labriola, MD 10/21/2024, 9:55 AM

## 2024-10-21 ENCOUNTER — Ambulatory Visit: Payer: Self-pay | Admitting: Psychiatry

## 2024-10-26 ENCOUNTER — Ambulatory Visit (INDEPENDENT_AMBULATORY_CARE_PROVIDER_SITE_OTHER): Admitting: Licensed Clinical Social Worker

## 2024-10-26 DIAGNOSIS — Z87898 Personal history of other specified conditions: Secondary | ICD-10-CM

## 2024-10-26 DIAGNOSIS — F4310 Post-traumatic stress disorder, unspecified: Secondary | ICD-10-CM

## 2024-10-26 DIAGNOSIS — F3162 Bipolar disorder, current episode mixed, moderate: Secondary | ICD-10-CM

## 2024-10-26 DIAGNOSIS — F633 Trichotillomania: Secondary | ICD-10-CM | POA: Diagnosis not present

## 2024-10-26 DIAGNOSIS — F902 Attention-deficit hyperactivity disorder, combined type: Secondary | ICD-10-CM

## 2024-10-26 NOTE — Progress Notes (Signed)
 THERAPIST PROGRESS NOTE  Virtual Visit via Video Note  I connected with Terri Holt on 10/26/24 at 11:00 AM EST by a video enabled telemedicine application and verified that I am speaking with the correct person using two identifiers.  Location: Patient: Work Address on file Provider: Providers Address   I discussed the limitations of evaluation and management by telemedicine and the availability of in person appointments. The patient expressed understanding and agreed to proceed.   I discussed the assessment and treatment plan with the patient. The patient was provided an opportunity to ask questions and all were answered. The patient agreed with the plan and demonstrated an understanding of the instructions.   The patient was advised to call back or seek an in-person evaluation if the symptoms worsen or if the condition fails to improve as anticipated.  I provided 56 minutes of non-face-to-face time during this encounter.   Terri Holt Husband, LCSW   Session Time: 11:02-11:58am  Participation Level: Active  Behavioral Response: CasualAlertAngry and    Type of Therapy: Individual Therapy  Treatment Goals addressed:  Active     BH CCP Acute or Chronic Trauma Reaction     LTG: Elimination of maladaptive behaviors and thinking patterns which interfere with resolution of trauma as evidenced by self report (Progressing)     Start:  04/22/24    Expected End:  12/15/24         LTG: Develop and implement effective coping skills to carry out normal responsibilities and participate constructively in relationships as evidenced by self report (Progressing)     Start:  04/22/24    Expected End:  12/15/24         LTG: try and unpack reactions and self-preservation from past trauma  (Progressing)     Start:  04/22/24    Expected End:  12/15/24         STG: Terri Pagan will identify internal and external stimuli that trigger PTSD symptoms (Progressing)      Start:  04/22/24    Expected End:  12/15/24         STG: Terri Pagan will verbalize an increased sense of mastery over PTSD symptoms by using several techniques to cope with flashbacks, decrease the power of triggers, and decrease negative thinking (Progressing)     Start:  04/22/24    Expected End:  12/15/24         Cooperate with trauma-focused psychotherapy techniques to reduce emotional reaction to the traumatic event      Start:  04/22/24         Educate Terri Pagan as to the origins of PTSD, common symptoms, and how it impacts those affected by it     Start:  04/22/24         Assess whether Terri Pagan experiences dissociative symptoms (e.g., flashbacks, memory loss, identity disorder), and treat or refer for treatment     Start:  04/22/24         Work with Terri Pagan to construct a list of the situations, people, & places that [name] evoke the most distressing symptoms; suggest that they keep a journal of instances of stress being triggered     Start:  04/22/24         Teach Terri Pagan coping strategies (e.g., writing down thoughts and feelings in a journal; taking deep, slow breaths; calling a support person to talk about memories) to deal with trauma memories and sudden emotional reactions without becoming emo  Start:  04/22/24          ProgressTowards Goals: Progressing  Interventions: CBT, Supportive, Reframing, and Other: Inner child work  Summary: Terri Holt is a 33 y.o. female who presents with hypervigilance, flashbacks, detachment from others, emotional numbing, and nightmares. Per documentation by her psychiatrist, she has a "history of self-harm behaviors, including a suicide attempt in 2020 following her separation from her ex-husband. She has been hospitalized multiple times for psychiatric reasons, with the most recent admission in 2020." Pt was oriented times 5. Pt was cooperative and engaged. Denies  SI/HI/AVH.   Reflected on the holiday season in light of the new family dynamics.   She's been dealing with grief following her uncle's recent death. During the holidays, comments from relatives have made her feel like the black sheep of the family. This has caused her hurt, which manifests as anger. She feels judged by her family, leading to low confidence and self-esteem; she often seeks validation from others. She also feels unloved because it seems her family wants to change who she is. Through exploration in session if she could let go of that desire for acceptance, she believes she would be less tense. She recognizes that she is not as affected by people outside of her family.  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  She is taking these experiences and choosing to change her approach to parenting, aiming to help her children feel accepted. She has explored ways to navigate family dynamics moving forward, accepting their behaviors as uncontrollable factors while also using assertiveness to establish boundaries.  Suicidal/Homicidal: Nowithout intent/plan  Therapist Response: Clinician utilized active and supportive reflection to create a safe space for patient to process recent life experiences. Clinician assessed for current symptoms, stressors, safety since last session.  Engaged in inner child work to explore ways in which patient can go back and read parent her inner child, but also ways in which she can take those same messages and incorporate into her own parenting styles with her children.  Explored root of family's anger and feeling disliked and on excepted by her relatives.  Identified ways in which patient can reframe exploring how acceptance would aid in improving overall confidence.  Identified ways in which patient can utilize assertive communication to continue to advocate.  Plan: Return again in 2 weeks.  Diagnosis:  Bipolar 1 disorder, mixed, moderate (HCC)  PTSD (post-traumatic stress disorder)  Attention deficit hyperactivity disorder (ADHD), combined type  Trichotillomania  History of substance use disorder   Collaboration of Care: AEB psychiatrist can access notes and cln. Will review psychiatrists' notes. Check in with the patient and will see LCSW per availability. Patient agreed with treatment recommendations.   Patient/Guardian was advised Release of Information must be obtained prior to any record release in order to collaborate their care with an outside provider. Patient/Guardian was advised if they have not already done so to contact the registration department to sign all necessary forms in order for us  to release information regarding their care.   Consent: Patient/Guardian gives verbal consent for treatment and assignment of benefits for services provided during this visit. Patient/Guardian expressed understanding and agreed to proceed.   Terri Holt Husband, LCSW 10/26/2024

## 2024-11-03 ENCOUNTER — Ambulatory Visit: Admitting: Psychiatry

## 2024-11-17 ENCOUNTER — Other Ambulatory Visit: Payer: Self-pay | Admitting: Psychiatry

## 2024-11-17 ENCOUNTER — Telehealth: Payer: Self-pay | Admitting: Psychiatry

## 2024-11-17 DIAGNOSIS — F3162 Bipolar disorder, current episode mixed, moderate: Secondary | ICD-10-CM

## 2024-11-17 NOTE — Telephone Encounter (Signed)
 Patient has been contacted 12/8, 12/12 and 12/23 to reschedule her appointment with Dr. Coby.

## 2024-11-23 ENCOUNTER — Telehealth: Payer: Self-pay | Admitting: Psychiatry

## 2024-11-23 ENCOUNTER — Ambulatory Visit (INDEPENDENT_AMBULATORY_CARE_PROVIDER_SITE_OTHER): Admitting: Licensed Clinical Social Worker

## 2024-11-23 ENCOUNTER — Encounter: Payer: Self-pay | Admitting: Licensed Clinical Social Worker

## 2024-11-23 DIAGNOSIS — Z87898 Personal history of other specified conditions: Secondary | ICD-10-CM | POA: Diagnosis not present

## 2024-11-23 DIAGNOSIS — F633 Trichotillomania: Secondary | ICD-10-CM

## 2024-11-23 DIAGNOSIS — F3162 Bipolar disorder, current episode mixed, moderate: Secondary | ICD-10-CM

## 2024-11-23 DIAGNOSIS — F902 Attention-deficit hyperactivity disorder, combined type: Secondary | ICD-10-CM | POA: Diagnosis not present

## 2024-11-23 DIAGNOSIS — F4310 Post-traumatic stress disorder, unspecified: Secondary | ICD-10-CM

## 2024-11-23 NOTE — Telephone Encounter (Signed)
 Therapist spoke with patient at appointment, reminded her she needed follow up with Dr. Coby. I then tried calling patient at 9:35 am which was after the therapy appointment. Patient not answer the phone and another message left to call and schedule

## 2024-11-23 NOTE — Progress Notes (Signed)
 "  THERAPIST PROGRESS NOTE  Virtual Visit via Video Note  I connected with Terri Holt on 11/23/2024 at 8:11am by a video enabled telemedicine application and verified that I am speaking with the correct person using two identifiers.  Location: Patient: Work Address Provider: Providers Address   I discussed the limitations of evaluation and management by telemedicine and the availability of in person appointments. The patient expressed understanding and agreed to proceed.  I discussed the assessment and treatment plan with the patient. The patient was provided an opportunity to ask questions and all were answered. The patient agreed with the plan and demonstrated an understanding of the instructions.   The patient was advised to call back or seek an in-person evaluation if the symptoms worsen or if the condition fails to improve as anticipated.  I provided 49 minutes of non-face-to-face time during this encounter.  [name] Husband, LCSW  Session Time: 8:11am-9am  Participation Level: Active  Behavioral Response: CasualAlertNegative and Depressed  Type of Therapy: Individual Therapy  Treatment Goals addressed:  Active     BH CCP Acute or Chronic Trauma Reaction     LTG: Elimination of maladaptive behaviors and thinking patterns which interfere with resolution of trauma as evidenced by self report (Progressing)     Start:  04/22/24    Expected End:  12/15/24         LTG: Develop and implement effective coping skills to carry out normal responsibilities and participate constructively in relationships as evidenced by self report (Progressing)     Start:  04/22/24    Expected End:  12/15/24         LTG: try and unpack reactions and self-preservation from past trauma  (Progressing)     Start:  04/22/24    Expected End:  12/15/24         STG: Terri Pagan will identify internal and external stimuli that trigger PTSD symptoms (Progressing)     Start:   04/22/24    Expected End:  12/15/24         STG: Terri Pagan will verbalize an increased sense of mastery over PTSD symptoms by using several techniques to cope with flashbacks, decrease the power of triggers, and decrease negative thinking (Progressing)     Start:  04/22/24    Expected End:  12/15/24         Cooperate with trauma-focused psychotherapy techniques to reduce emotional reaction to the traumatic event      Start:  04/22/24         Educate Terri Pagan as to the origins of PTSD, common symptoms, and how it impacts those affected by it     Start:  04/22/24         Assess whether Terri Pagan experiences dissociative symptoms (e.g., flashbacks, memory loss, identity disorder), and treat or refer for treatment     Start:  04/22/24         Work with Terri Pagan to construct a list of the situations, people, & places that [name] evoke the most distressing symptoms; suggest that they keep a journal of instances of stress being triggered     Start:  04/22/24         Teach Terri Pagan coping strategies (e.g., writing down thoughts and feelings in a journal; taking deep, slow breaths; calling a support person to talk about memories) to deal with trauma memories and sudden emotional reactions without becoming emo     Start:  04/22/24  ProgressTowards Goals: Progressing  Interventions: CBT, Supportive, and Reframing  Summary: Terri Holt is a 33 y.o. female who presents with hypervigilance, flashbacks, detachment from others, emotional numbing, and nightmares. Per documentation by her psychiatrist, she has a history of self-harm behaviors, including a suicide attempt in 2020 following her separation from her ex-husband. She has been hospitalized multiple times for psychiatric reasons, with the most recent admission in 2020. Pt was oriented times 5. Pt was cooperative and engaged. Denies SI/HI/AVH.   The patient  reflected on the differences in holiday experiences due to changing relationships. She considered her efforts to establish boundaries with her mother and examined patterns in her personal relationships, expressing concerns about behaviors that her children could potentially mimic. She also reflected on red flags in relationships and explored how she can recognize green flags, defining what constitutes healthy relationships based on her lived experiences.   For the next session, the patient will resume EMDR therapy.  Suicidal/Homicidal: Nowithout intent/plan  Therapist Response: Clinician utilized active and supportive reflection to create a safe space for patient to process recent life experiences. Clinician assessed for current symptoms, stressors, safety since last session.  Clinician worked with patient to reframe negative perspectives around consequences of establishing boundaries and/or removing unhealthy relationships from her life as a result of newfound realizations.  Encouraged patient to begin to identify changes that occur externally and internally when she is engaging in an uncomfortable social interaction versus ones she feels comfortable.  Encourage patient to focus on positive exchanges in an effort to increase opportunities of building trust with others as she embarks on this new chapter exploring healthier relationships.  Reflected on patient's level of emotional stability following a series of changes that have occurred within her personal life.  Patient reports she feels emotionally and mentally ready to resume trauma therapy through EMDR.  Plan: Return again in 2 weeks.  Diagnosis: Bipolar 1 disorder, mixed, moderate (HCC)  PTSD (post-traumatic stress disorder)  Attention deficit hyperactivity disorder (ADHD), combined type  Trichotillomania  History of substance use disorder   Collaboration of Care: Psychiatrist AEB AEB psychiatrist can access notes and cln. Will review  psychiatrists' notes. Check in with the patient and will see LCSW per availability. Patient agreed with treatment recommendations.   Patient/Guardian was advised Release of Information must be obtained prior to any record release in order to collaborate their care with an outside provider. Patient/Guardian was advised if they have not already done so to contact the registration department to sign all necessary forms in order for us  to release information regarding their care.   Consent: Patient/Guardian gives verbal consent for treatment and assignment of benefits for services provided during this visit. Patient/Guardian expressed understanding and agreed to proceed.   [name] Husband, LCSW 11/23/2024  "

## 2024-12-01 ENCOUNTER — Encounter: Payer: Self-pay | Admitting: Licensed Clinical Social Worker

## 2024-12-11 DIAGNOSIS — F902 Attention-deficit hyperactivity disorder, combined type: Secondary | ICD-10-CM

## 2024-12-11 MED ORDER — LISDEXAMFETAMINE DIMESYLATE 40 MG PO CAPS: 40.0000 mg | ORAL_CAPSULE | ORAL | 0 refills | Status: AC

## 2024-12-14 ENCOUNTER — Other Ambulatory Visit: Payer: Self-pay

## 2024-12-14 ENCOUNTER — Ambulatory Visit: Admitting: Licensed Clinical Social Worker

## 2024-12-14 ENCOUNTER — Ambulatory Visit: Admitting: Psychiatry

## 2024-12-14 ENCOUNTER — Encounter: Payer: Self-pay | Admitting: Psychiatry

## 2024-12-14 VITALS — BP 107/68 | HR 81 | Temp 97.8°F | Ht 66.0 in | Wt 133.0 lb

## 2024-12-14 DIAGNOSIS — F902 Attention-deficit hyperactivity disorder, combined type: Secondary | ICD-10-CM | POA: Diagnosis not present

## 2024-12-14 DIAGNOSIS — F633 Trichotillomania: Secondary | ICD-10-CM

## 2024-12-14 DIAGNOSIS — F4310 Post-traumatic stress disorder, unspecified: Secondary | ICD-10-CM

## 2024-12-14 DIAGNOSIS — F3162 Bipolar disorder, current episode mixed, moderate: Secondary | ICD-10-CM

## 2024-12-14 DIAGNOSIS — Z87898 Personal history of other specified conditions: Secondary | ICD-10-CM

## 2024-12-14 MED ORDER — OXCARBAZEPINE 150 MG PO TABS: 150.0000 mg | ORAL_TABLET | Freq: Two times a day (BID) | ORAL | 1 refills | Status: AC

## 2024-12-14 NOTE — Progress Notes (Unsigned)
 BH MD  OP Progress Note  12/14/2024 2:54 PM Terri Holt  MRN:  [id number]  Chief Complaint:  Chief Complaint  Patient presents with   Follow-up   Depression   Anxiety   ADD   Medication Refill   Insomnia   Discussed the use of AI scribe software for clinical note transcription with the patient, who gave verbal consent to proceed.  History of Present Illness Terri Holt is a 34 year old Caucasian female, currently employed, single, lives in [place], has a history of bipolar disorder type I, ADHD, PTSD, trichotillomania, anxiety disorder, polysubstance abuse in remission was evaluated in office today for a follow-up appointment.  Persistent tiredness affects her daily functioning, and she notes difficulty distinguishing between physical and mental fatigue. She describes feeling bored and exhausted, with episodes of low mood. Her mood typically stabilizes but then she unexpectedly experiences a downturn, which she partially links to ongoing fatigue. Significant psychosocial stressors include dissatisfaction with her current job, pressure from schoolwork, and the challenges of single parenting without support at home. She feels overwhelmed by these responsibilities and experiences additional emotional strain related to family relationships, including loss of connection with her mother and a lack of supportive relationship with her father. Guilt and pressure from others regarding her family dynamics contribute to her sense of being drained.  Ongoing sleep difficulties persist, as she feels exhausted during the day despite obtaining approximately 7 to 7.5 hours of sleep at night. She explains that any noise can wake her and she does not feel she enters deep sleep. She takes trazodone  only on Friday and Saturday nights when her children are at their father's, as it makes waking up in the morning difficult. Even a half dose of trazodone  causes significant morning grogginess,  and a full dose made her sleep until the afternoon the next day. She avoids trazodone  when her children are home due to concerns about waking up. She reports that she is able to fall asleep but continues to feel exhausted during the day.  Her current regimen includes Abilify  5 mg daily at approximately 8 a.m., and she reports no side effects other than persistent tiredness. She finds splitting the dose challenging, as she sometimes forgets to take the second half. She also takes hydroxyzine  50 mg as needed, typically around lunchtime, about 5 to 6 times per week, and notes that it can cause sleepiness, especially if she takes more than 1 tablet in a day. She is not currently taking Lamictal , having weaned herself off Lamictal  due to persistent tiredness.  She continues to be compliant on the Vyvanse .  Anxiety continues, particularly on difficult days, sometimes prompting her to take an additional hydroxyzine . This leads to excessive sleepiness and difficulty staying awake. She also experiences ongoing hair pulling, specifically her eyelashes, and works on breathing techniques in therapy to address this behavior.  Therapy remains beneficial, and she collaborates with her therapist on strategies for managing her symptoms, including breathing techniques for hair pulling.  She denies suicidal thoughts, thoughts of harming others, hallucinations, and paranoia.   Visit Diagnosis:    ICD-10-CM   1. Bipolar 1 disorder, mixed, moderate (HCC)  F31.62 OXcarbazepine  (TRILEPTAL ) 150 MG tablet    2. PTSD (post-traumatic stress disorder)  F43.10 OXcarbazepine  (TRILEPTAL ) 150 MG tablet    3. Attention deficit hyperactivity disorder (ADHD), combined type  F90.2     4. Trichotillomania  F63.3 OXcarbazepine  (TRILEPTAL ) 150 MG tablet    5. History of substance use disorder  S12.101    Cocaine, Xanax and heroin in remission      Past Psychiatric History: I have reviewed past psychiatric history from progress  note on 02/01/2024.  I have reviewed past psychiatric history from progress note on 04/21/2024  Past Medical History:  Past Medical History:  Diagnosis Date   Anxiety    Bipolar I disorder, most recent episode depressed, severe without psychotic features (HCC)    Constipation    Depression    Headache    Migraines    Mini stroke    Schizophrenia (HCC)     Past Surgical History:  Procedure Laterality Date   CESAREAN SECTION     CESAREAN SECTION N/A 02/26/2018   Procedure: REPEAT CESAREAN SECTION;  Surgeon: Connell Davies, MD;  Location: [facility] ORS;  Service: Obstetrics;  Laterality: N/A;   COLONOSCOPY WITH PROPOFOL  N/A 05/13/2023   Procedure: COLONOSCOPY WITH PROPOFOL ;  Surgeon: Unk Corinn Skiff, MD;  Location: [facility] ENDOSCOPY;  Service: Gastroenterology;  Laterality: N/A;   WISDOM TOOTH EXTRACTION      Family Psychiatric History: I have reviewed family psychiatric history from progress note on 02/01/2024  Family History:  Family History  Problem Relation Age of Onset   Depression Mother    Migraines Mother    ADD / ADHD Father    Depression Father    Asthma Brother    Hyperlipidemia Brother    ADD / ADHD Maternal Aunt    Thyroid  disease Maternal Aunt    Cancer Maternal Aunt        melanoma   Heart failure Maternal Grandfather    Hyperlipidemia Maternal Grandfather    Asthma Maternal Grandmother    Hyperlipidemia Maternal Grandmother    Bipolar disorder Paternal Grandfather    Suicidality Paternal Grandfather    Heart failure Paternal Grandmother    Cancer Paternal Grandmother        tumor   ADD / ADHD Cousin    ADD / ADHD Son    Asthma Son     Social History: I have reviewed social history from progress note on 02/01/2024 Social History   Socioeconomic History   Marital status: Divorced    Spouse name: Not on file   Number of children: Not on file   Years of education: Not on file   Highest education level: Associate degree: occupational, scientist, product/process development, or vocational  program  Occupational History   Not on file  Tobacco Use   Smoking status: Former    Current packs/day: 0.00    Average packs/day: 0.5 packs/day for 4.0 years (2.0 ttl pk-yrs)    Types: Cigarettes    Start date: 06/26/2013    Quit date: 06/26/2017    Years since quitting: 7.4   Smokeless tobacco: Never  Vaping Use   Vaping status: Some Days  Substance and Sexual Activity   Alcohol use: Not Currently    Alcohol/week: 16.0 standard drinks of alcohol    Types: 16 Glasses of wine per week    Comment: wine at night, occasionally   Drug use: Not Currently    Types: Amphetamines   Sexual activity: Yes    Partners: Male    Birth control/protection: None, Surgical    Comment: Tubal Ligation  Other Topics Concern   Not on file  Social History Narrative   ** Merged History Encounter **       Social Drivers of Health   Tobacco Use: Medium Risk (12/14/2024)   Patient History    Smoking Tobacco Use: Former  Smokeless Tobacco Use: Never    Passive Exposure: Not on file  Financial Resource Strain: Low Risk (05/06/2024)   Overall Financial Resource Strain (CARDIA)    Difficulty of Paying Living Expenses: Not hard at all  Food Insecurity: No Food Insecurity (05/06/2024)   Hunger Vital Sign    Worried About Running Out of Food in the Last Year: Never true    Ran Out of Food in the Last Year: Never true  Transportation Needs: No Transportation Needs (05/06/2024)   PRAPARE - Administrator, Civil Service (Medical): No    Lack of Transportation (Non-Medical): No  Physical Activity: Sufficiently Active (03/20/2023)   Exercise Vital Sign    Days of Exercise per Week: 5 days    Minutes of Exercise per Session: 30 min  Stress: Stress Concern Present (05/06/2024)   [employer] of Occupational Health - Occupational Stress Questionnaire    Feeling of Stress : Very much  Social Connections: Socially Isolated (03/20/2023)   Social Connection and Isolation Panel    Frequency of  Communication with Friends and Family: More than three times a week    Frequency of Social Gatherings with Friends and Family: Twice a week    Attends Religious Services: Never    Database Administrator or Organizations: No    Attends Engineer, Structural: Not on file    Marital Status: Divorced  Depression (PHQ2-9): High Risk (12/14/2024)   Depression (PHQ2-9)    PHQ-2 Score: 20  Alcohol Screen: Low Risk (05/06/2024)   Alcohol Screen    Last Alcohol Screening Score (AUDIT): 0  Housing: Unknown (01/29/2024)   Received from [facility]   Epic    Unable to Pay for Housing in the Last Year: Not on file    Number of Times Moved in the Last Year: Not on file    At any time in the past 12 months, were you homeless or living in a shelter (including now)?: No  Utilities: Not At Risk (05/06/2024)   AHC Utilities    Threatened with loss of utilities: No  Health Literacy: Adequate Health Literacy (05/06/2024)   B1300 Health Literacy    Frequency of need for help with medical instructions: Never    Allergies: Allergies[1]  Metabolic Disorder Labs: Lab Results  Component Value Date   HGBA1C 4.7 (L) 01/31/2019   MPG 88.19 01/31/2019   No results found for: PROLACTIN Lab Results  Component Value Date   CHOL 120 01/31/2019   TRIG 147 01/31/2019   HDL 50 01/31/2019   CHOLHDL 2.4 01/31/2019   VLDL 29 01/31/2019   LDLCALC 41 01/31/2019   Lab Results  Component Value Date   TSH 2.789 02/17/2024   TSH 1.540 10/04/2022    Therapeutic Level Labs: No results found for: LITHIUM No results found for: VALPROATE Lab Results  Component Value Date   CBMZ 3.4 (L) 06/11/2019    Current Medications: Current Outpatient Medications  Medication Sig Dispense Refill   hydrOXYzine  (VISTARIL ) 50 MG capsule Take 1 capsule (50 mg total) by mouth 3 (three) times daily as needed for anxiety. 90 capsule 1   OXcarbazepine  (TRILEPTAL ) 150 MG tablet Take 1 tablet (150 mg  total) by mouth 2 (two) times daily. 60 tablet 1   ARIPiprazole  (ABILIFY ) 5 MG tablet Take 1 tablet (5 mg total) by mouth daily. F31.62 90 tablet 0   lisdexamfetamine  (VYVANSE ) 40 MG capsule Take 1 capsule (40 mg total) by mouth every morning. 30  capsule 0   Multiple Vitamin (MULTI-VITAMIN) tablet Take 1 tablet by mouth daily.     ondansetron  (ZOFRAN -ODT) 4 MG disintegrating tablet Take 1 tablet (4 mg total) by mouth every 8 (eight) hours as needed for nausea or vomiting. 20 tablet 0   SUMAtriptan  (IMITREX ) 25 MG tablet Take 1 tablet (25 mg total) by mouth every 2 (two) hours as needed for migraine. May repeat in 2 hours if headache persists or recurs. 10 tablet 0   traZODone  (DESYREL ) 50 MG tablet Take 0.5-1 tablets (25-50 mg total) by mouth at bedtime as needed for sleep. 30 tablet 1   Vitamin D , Ergocalciferol , (DRISDOL ) 1.25 MG (50000 UNIT) CAPS capsule Take 1 capsule (50,000 Units total) by mouth every 7 (seven) days. 26 capsule 1   No current facility-administered medications for this visit.     Musculoskeletal: Strength & Muscle Tone: within normal limits Gait & Station: normal Patient leans: N/A  Psychiatric Specialty Exam: Review of Systems  Psychiatric/Behavioral:  Positive for dysphoric mood and sleep disturbance.     Blood pressure 107/68, pulse 81, temperature 97.8 F (36.6 C), temperature source Temporal, height 5' 6 (1.676 m), weight 133 lb (60.3 kg).Body mass index is 21.47 kg/m.  General Appearance: Casual  Eye Contact:  Good  Speech:  Clear and Coherent  Volume:  Normal  Mood:  Depressed  Affect:  Congruent  Thought Process:  Goal Directed and Descriptions of Associations: Intact  Orientation:  Full (Time, Place, and Person)  Thought Content: Logical   Suicidal Thoughts:  No  Homicidal Thoughts:  No  Memory:  Immediate;   Fair Recent;   Fair Remote;   Fair  Judgement:  Fair  Insight:  Fair  Psychomotor Activity:  Normal  Concentration:  Concentration: Fair  and Attention Span: Fair  Recall:  [employer] of Knowledge: Fair  Language: Fair  Akathisia:  No  Handed:  Right  AIMS (if indicated): done  Assets:  Communication Skills Desire for Improvement Housing Social Support Talents/Skills Transportation  ADL's:  Intact  Cognition: WNL  Sleep:  Poor   Screenings: AIMS    Flowsheet Row Office Visit from 12/14/2024 in [facility] Admission (Discharged) from 01/31/2019 in [facility]  AIMS Total Score 0 0   AUDIT    Flowsheet Row Admission (Discharged) from 01/31/2019 in [facility]INE  Alcohol Use Disorder Identification Test Final Score (AUDIT) 0   GAD-7    Flowsheet Row Office Visit from 12/14/2024 in [facility] Office Visit from 05/06/2024 in [facility] Counselor from 04/22/2024 in [facility] Office Visit from 03/05/2024 in [facility] Office Visit from 01/17/2024 in [facility]  Total GAD-7 Score 10 16 19 15 21    PHQ2-9    Flowsheet Row Office Visit from 12/14/2024 in [facility] Office Visit from 05/06/2024 in [facility] Counselor from 04/22/2024 in [facility] Office Visit from 03/05/2024 in [facility] Office Visit from 01/17/2024 in [facility]  PHQ-2 Total Score 6 6 6 6 6   PHQ-9 Total Score 20 24 26 22 24    Flowsheet Row Office Visit from 12/14/2024 in [facility] Video Visit from 10/20/2024 in [facility] Video Visit from 09/30/2024 in [facility]  C-SSRS RISK CATEGORY Moderate Risk  Moderate Risk Moderate Risk     Assessment and Plan: Terri Holt is a 34 year old Caucasian female who presented for a follow-up appointment, discussed assessment and plan as noted below.  1. Bipolar 1 disorder, mixed, moderate (HCC)-unstable Ongoing depression symptoms, fatigue likely multifactorial including sleep problems, job related stress, being in school as well as being a single mother. Continue Abilify  5 mg daily Discontinue Lamictal , she stopped taking it due to side effects of fatigue. Start Trileptal  150 mg twice daily I have reviewed sodium level dated 05/08/2024-137, CBC with differential-within normal limits.  Will repeat sodium and platelet count in the future since Trileptal  has been added   2. PTSD (post-traumatic stress disorder)-unstable On going mood symptoms triggered by situational stressors.  Ongoing sleep problems and has been noncompliant with Trazodone  since she is worried about taking the medication when she has her kids. However agrees to take a low dosage of Trazodone  for the next few weeks prior to making further changes. Continue Trazodone  25-50 mg at bedtime as needed Continue Hydroxyzine  50 mg as needed, provided education about taking it during the day which could cause fatigue and drowsiness. Encouraged to continue CBT, with Ms. Perkins   3.  Attention deficit hyperactivity disorder-improving Reports Vyvanse  is beneficial however ongoing fatigue likely due to not sleeping well. Continue Vyvanse  40 mg daily in the morning Reviewed Between PMP AWARxE Consider referral for sleep study in the future if needed.   4. Trichotillomania-improving Ongoing hair pulling behaviors Continue psychotherapy sessions Continue Hydroxyzine  50 mg as needed, currently prescribed by primary care provider.   4. History of substance use disorder Currently sober.  Will consider getting urine drug screen at next visit.  Follow-up Follow-up in clinic in 2 to 3  weeks or sooner if needed.    Collaboration of Care: Collaboration of Care: Referral or follow-up with counselor/therapist AEB encouraged to continue psychotherapy sessions.  I have coordinated care with Ms. Perkins reviewed notes from 11/23/2024.  Patient/Guardian was advised Release of Information must be obtained prior to any record release in order to collaborate their care with an outside provider. Patient/Guardian was advised if they have not already done so to contact the registration department to sign all necessary forms in order for us  to release information regarding their care.   Consent: Patient/Guardian gives verbal consent for treatment and assignment of benefits for services provided during this visit. Patient/Guardian expressed understanding and agreed to proceed.   This note was generated in part or whole with voice recognition software. Voice recognition is usually quite accurate but there are transcription errors that can and very often do occur. I apologize for any typographical errors that were not detected and corrected.    Ivry Pigue, MD 12/15/2024, 5:57 PM     [1]  Allergies Allergen Reactions   Amoxicillin Hives and Rash   Penicillins Hives and Rash    Has patient had a PCN reaction causing immediate rash, facial/tongue/throat swelling, SOB or lightheadedness with hypotension: Yes Has patient had a PCN reaction causing severe rash involving mucus membranes or skin necrosis: Yes Has patient had a PCN reaction that required hospitalization: No Has patient had a PCN reaction occurring within the last 10 years: Yes If all of the above answers are NO, then may proceed with Cephalosporin use.    Adhesive [Tape]     Pull skin off.  Both paper tape and tegaderm are ok   Latex Rash  Nickel Hives and Rash

## 2024-12-14 NOTE — Patient Instructions (Signed)
 Oxcarbazepine Tablets What is this medication? OXCARBAZEPINE (ox car BAZ e peen) prevents and controls seizures in people with epilepsy. It works by calming overactive nerves in your body. This medicine may be used for other purposes; ask your health care provider or pharmacist if you have questions. COMMON BRAND NAME(S): Trileptal What should I tell my care team before I take this medication? They need to know if you have any of these conditions: Asian ancestry Kidney disease Liver disease Suicidal thoughts, plans, or attempt by you or a family member Any unusual or allergic reaction to oxcarbazepine, carbamazepine, other medications, foods, dyes, or preservatives Pregnant or trying to get pregnant Breast-feeding How should I use this medication? Take this medication by mouth with a glass of water. Follow the directions on the prescription label. This medication may be taken with or without food. Take your doses at regular intervals. Do not take your medication more often than directed. Do not stop taking except on the advice of your care team. A special MedGuide will be given to you by the pharmacist with each prescription and refill. Be sure to read this information carefully each time. Talk to your care team about the use of this medication in children. While this medication may be prescribed for children as young as 2 years for selected conditions, precautions do apply. Overdosage: If you think you have taken too much of this medicine contact a poison control center or emergency room at once. NOTE: This medicine is only for you. Do not share this medicine with others. What if I miss a dose? If you miss a dose, take it as soon as you can. If it is almost time for your next dose, take only that dose. Do not take double or extra doses. What may interact with this medication? Do not take this medication with any of the following: Carbamazepine This medication may also interact with the  following: Certain medications for seizures, such as phenobarbital, phenytoin, valproic acid Certain medications for blood pressure, such as felodipine, diltiazem, verapamil Cyclosporine Estrogen and progestin hormones This list may not describe all possible interactions. Give your health care provider a list of all the medicines, herbs, non-prescription drugs, or dietary supplements you use. Also tell them if you smoke, drink alcohol, or use illegal drugs. Some items may interact with your medicine. What should I watch for while using this medication? Visit your care team for regular checks on your progress. Tell your care team if your symptoms do not start to get better or if they get worse. This medication may affect your coordination, reaction time, or judgment. Do not drive or operate machinery until you know how this medication affects you. Sit up or stand slowly to reduce the risk of dizzy or fainting spells. Drinking alcohol with this medication can increase the risk of these side effects. This medication may cause serious skin reactions. They can happen weeks to months after starting the medication. Contact your care team right away if you notice fevers or flu-like symptoms with a rash. The rash may be red or purple and then turn into blisters or peeling of the skin. You may also notice a red rash with swelling of the face, lips, or lymph nodes in your neck or under your arms. Wear a medical ID bracelet or chain. Carry a card that describes your condition. List the medications and doses you take on the card. Estrogen and progestin hormones may not work as well while you are taking this medication.  Your care team can help you find the contraceptive option that works for you. People who become pregnant while using this medication may enroll in the [place] American Antiepileptic Drug Pregnancy Registry by calling [phone]. This registry collects information about the safety of antiepileptic  medication use during pregnancy. What side effects may I notice from receiving this medication? Side effects that you should report to your care team as soon as possible: Allergic reactions--skin rash, itching, hives, swelling of the face, lips, tongue, or throat Infection--fever, chills, cough, or sore throat Low sodium level--muscle weakness, fatigue, dizziness, headache, confusion Rash, fever, and swollen lymph nodes Redness, blistering, peeling, or loosening of the skin, including inside the mouth Seizures Thoughts of suicide or self-harm, worsening mood, or feelings of depression Side effects that usually do not require medical attention (report to your care team if they continue or are bothersome): Difficulty with paying attention, memory, or speech Dizziness Drowsiness Double vision Headache Loss of balance or coordination Nausea Slow or sluggish movements of the body This list may not describe all possible side effects. Call your doctor for medical advice about side effects. You may report side effects to FDA at 1-800-FDA-1088. Where should I keep my medication? Keep out of reach of children and pets. Store at room temperature between 15 and 30 degrees C (59 and 86 degrees F). Keep container tightly closed. Throw away any unused medication after the expiration date. NOTE: This sheet is a summary. It may not cover all possible information. If you have questions about this medicine, talk to your doctor, pharmacist, or health care provider.  2024 Elsevier/Gold Standard (2023-03-22 00:00:00)

## 2024-12-15 NOTE — Progress Notes (Unsigned)
 "  THERAPIST PROGRESS NOTE  Virtual Visit via Video Note  I connected with [name] on 12/15/24 at 11:00 AM EST by a video enabled telemedicine application and verified that I am speaking with the correct person using two identifiers.  Location: Patient: Home Address on file  Provider: [facility]   I discussed the limitations of evaluation and management by telemedicine and the availability of in person appointments. The patient expressed understanding and agreed to proceed.   I discussed the assessment and treatment plan with the patient. The patient was provided an opportunity to ask questions and all were answered. The patient agreed with the plan and demonstrated an understanding of the instructions.   The patient was advised to call back or seek an in-person evaluation if the symptoms worsen or if the condition fails to improve as anticipated.  I provided 49 minutes of non-face-to-face time during this encounter.   [name] Husband, LCSW   Session Time: 11-11:49  Participation Level: Active  Behavioral Response: CasualAlertNegative and flat, tearful  Type of Therapy: Individual Therapy  Treatment Goals addressed:  Active     BH CCP Acute or Chronic Trauma Reaction     LTG: Elimination of maladaptive behaviors and thinking patterns which interfere with resolution of trauma as evidenced by self report (Not Progressing)     Start:  04/22/24    Expected End:  06/15/25         LTG: Develop and implement effective coping skills to carry out normal responsibilities and participate constructively in relationships as evidenced by self report (Progressing)     Start:  04/22/24    Expected End:  06/15/25       Goal Note     12/16/24: Patient continues to demonstrate improvements with personal and external boundaries based on previous lived experiences.  However, patient notes she has attempted to utilize breathing techniques to manage anxiety but has not noticed any  improvements.         LTG: try and unpack reactions and self-preservation from past trauma  (Not Progressing)     Start:  04/22/24    Expected End:  06/15/25         STG: Comer Pagan will identify internal and external stimuli that trigger PTSD symptoms (Progressing)     Start:  04/22/24    Expected End:  06/15/25       Goal Note     12/16/24: Patient is becoming more aware of external stimuli that triggered her trauma responses.  Patient also demonstrates improvements with her ability to identify triggering events that led to various experiences with negative cognitions identifying she has internalized negative messaging from others throughout her life.         STG: Leandria Thier will verbalize an increased sense of mastery over PTSD symptoms by using several techniques to cope with flashbacks, decrease the power of triggers, and decrease negative thinking (Not Progressing)     Start:  04/22/24    Expected End:  06/15/25         Cooperate with trauma-focused psychotherapy techniques to reduce emotional reaction to the traumatic event      Start:  04/22/24         Educate Comer Pagan as to the origins of PTSD, common symptoms, and how it impacts those affected by it     Start:  04/22/24         Assess whether Comer Pagan experiences dissociative symptoms (e.g., flashbacks, memory loss, identity disorder),  and treat or refer for treatment     Start:  04/22/24         Work with Comer Pagan to construct a list of the situations, people, & places that Comer Pagan evoke the most distressing symptoms; suggest that they keep a journal of instances of stress being triggered     Start:  04/22/24         Teach Comer Pagan coping strategies (e.g., writing down thoughts and feelings in a journal; taking deep, slow breaths; calling a support person to talk about memories) to deal with trauma memories and sudden emotional reactions without  becoming emo     Start:  04/22/24            ProgressTowards Goals: Progressing  Interventions: CBT, Solution Focused, Supportive, and Reframing  Summary: Feliciana Narayan is a 34 y.o. female who presents with hypervigilance, flashbacks, detachment from others, emotional numbing, and nightmares. Per documentation by her psychiatrist, she has a history of self-harm behaviors, including a suicide attempt in 2020 following her separation from her ex-husband. She has been hospitalized multiple times for psychiatric reasons, with the most recent admission in 2020. Pt was oriented times 5. Pt was cooperative and engaged. Denies SI/HI/AVH.   Stressed out about work, school, and kids, she expressed concern about the impending weather and mentioned needing to find money for a generator. She reflected on her current relationships with relatives and indicated that her anxiety levels were through the roof, making her unproductive at work. Financial stressors and a lack of productivity raised concerns about her job security. With only four weeks left in school, she is considering applying for another job. She experiences dizziness when standing up and struggles to get enough sleep. She plans to pick up added medications to assist with her low mood from the pharmacy today.   To practice self-care, she intends to take a bath this evening and go to bed early. Although she has been sleeping more, it has not been restful, as she wakes up frequently throughout the night. She shared that she has always struggled with sleep. Worked with the cln to explore sleep hygiene and solutions for her restlessness, such as taking short naps and getting up at night to break negative associations. Her appetite has diminished, but she is trying to eat more protein-rich foods and has been encouraged to increase her protein intake through solutions like protein shakes.   The patient became tearful, stating, I am not good  enough. The clinician worked with her through [employer] [facility] (CBT) to challenge this negative internal dialogue and explore ways for her to give herself grace. We discussed how she would advise a friend in a similar situation, and she was able to empathize and offer valuable feedback. We reflected on how she could take her own advice and apply it to her daily life.   The clinician provided numerous resources, including podcasts, progressive muscle relaxation meditations, and strategies to help the patient address her areas of need.  For the next session, the patient will resume EMDR therapy.   Clinician briefly updated patient's treatment plan and signed due to meeting being virtual. Suicidal/Homicidal: Nowithout intent/plan  Therapist Response: Clinician utilized active and supportive reflection to create a safe space for patient to process recent life experiences. Clinician assessed for current symptoms, stressors, safety since last session.   Patient worked with patient through cognitive restructuring by identifying negative thinking patterns and exploring ways in which she can reframe negative  internal dialogue.  Worked with patient on solution building techniques to address sleep hygiene addressing barriers and exploring likelihood of patient engaging in solutions.  Educated patient on techniques to catch negative thoughts and detach them from her consciousness by externalizing them through redirect of techniques and thought dumping.   Plan: Return again in 2 weeks.  Diagnosis: Bipolar 1 disorder, mixed, moderate (HCC)  PTSD (post-traumatic stress disorder)  Attention deficit hyperactivity disorder (ADHD), combined type  Trichotillomania  History of substance use disorder   Collaboration of Care: [facility] psychiatrist can access notes and cln. Will review psychiatrists' notes. Check in with the patient and will see LCSW per availability. Patient agreed with treatment  recommendations.     Patient/Guardian was advised Release of Information must be obtained prior to any record release in order to collaborate their care with an outside provider. Patient/Guardian was advised if they have not already done so to contact the registration department to sign all necessary forms in order for us  to release information regarding their care.   Consent: Patient/Guardian gives verbal consent for treatment and assignment of benefits for services provided during this visit. Patient/Guardian expressed understanding and agreed to proceed.   [name] Husband, LCSW 12/15/2024  "

## 2024-12-16 ENCOUNTER — Encounter: Payer: Self-pay | Admitting: Licensed Clinical Social Worker

## 2024-12-16 ENCOUNTER — Ambulatory Visit: Admitting: Licensed Clinical Social Worker

## 2024-12-16 DIAGNOSIS — F4310 Post-traumatic stress disorder, unspecified: Secondary | ICD-10-CM

## 2024-12-16 DIAGNOSIS — F902 Attention-deficit hyperactivity disorder, combined type: Secondary | ICD-10-CM

## 2024-12-16 DIAGNOSIS — F3162 Bipolar disorder, current episode mixed, moderate: Secondary | ICD-10-CM

## 2024-12-16 DIAGNOSIS — F633 Trichotillomania: Secondary | ICD-10-CM

## 2024-12-16 DIAGNOSIS — Z87898 Personal history of other specified conditions: Secondary | ICD-10-CM

## 2024-12-28 ENCOUNTER — Ambulatory Visit (INDEPENDENT_AMBULATORY_CARE_PROVIDER_SITE_OTHER): Admitting: Licensed Clinical Social Worker

## 2024-12-28 DIAGNOSIS — F902 Attention-deficit hyperactivity disorder, combined type: Secondary | ICD-10-CM

## 2024-12-28 DIAGNOSIS — Z87898 Personal history of other specified conditions: Secondary | ICD-10-CM | POA: Diagnosis not present

## 2024-12-28 DIAGNOSIS — F633 Trichotillomania: Secondary | ICD-10-CM | POA: Diagnosis not present

## 2024-12-28 DIAGNOSIS — F3162 Bipolar disorder, current episode mixed, moderate: Secondary | ICD-10-CM | POA: Diagnosis not present

## 2024-12-28 DIAGNOSIS — F4310 Post-traumatic stress disorder, unspecified: Secondary | ICD-10-CM | POA: Diagnosis not present

## 2025-01-11 ENCOUNTER — Ambulatory Visit: Admitting: Licensed Clinical Social Worker

## 2025-01-13 ENCOUNTER — Telehealth: Admitting: Psychiatry

## 2025-01-25 ENCOUNTER — Ambulatory Visit: Admitting: Licensed Clinical Social Worker

## 2025-02-08 ENCOUNTER — Ambulatory Visit: Admitting: Licensed Clinical Social Worker
